# Patient Record
Sex: Female | Born: 1960 | Race: White | Hispanic: No | Marital: Married | Smoking: Former smoker
Health system: Southern US, Community
[De-identification: ages and names within clinical notes are randomized; demographics above are authoritative.]

## PROBLEM LIST (undated history)

## (undated) DIAGNOSIS — F32A Depression, unspecified: Secondary | ICD-10-CM

## (undated) DIAGNOSIS — K8020 Calculus of gallbladder without cholecystitis without obstruction: Secondary | ICD-10-CM

## (undated) DIAGNOSIS — G43909 Migraine, unspecified, not intractable, without status migrainosus: Secondary | ICD-10-CM

## (undated) DIAGNOSIS — N189 Chronic kidney disease, unspecified: Secondary | ICD-10-CM

## (undated) DIAGNOSIS — C50919 Malignant neoplasm of unspecified site of unspecified female breast: Secondary | ICD-10-CM

## (undated) DIAGNOSIS — F329 Major depressive disorder, single episode, unspecified: Secondary | ICD-10-CM

## (undated) HISTORY — PX: HYSTEROSCOPY WITH D & C: SHX1775

## (undated) HISTORY — PX: MASTECTOMY: SHX3

## (undated) HISTORY — PX: BREAST SURGERY: SHX581

---

## 2009-03-15 ENCOUNTER — Ambulatory Visit: Payer: Self-pay | Admitting: Oncology

## 2009-04-27 ENCOUNTER — Encounter (INDEPENDENT_AMBULATORY_CARE_PROVIDER_SITE_OTHER): Payer: Self-pay | Admitting: *Deleted

## 2009-04-27 ENCOUNTER — Ambulatory Visit: Payer: Self-pay | Admitting: Family Medicine

## 2009-04-27 DIAGNOSIS — E7800 Pure hypercholesterolemia, unspecified: Secondary | ICD-10-CM

## 2009-04-27 DIAGNOSIS — E1129 Type 2 diabetes mellitus with other diabetic kidney complication: Secondary | ICD-10-CM

## 2009-04-27 DIAGNOSIS — C50919 Malignant neoplasm of unspecified site of unspecified female breast: Secondary | ICD-10-CM | POA: Insufficient documentation

## 2009-04-27 DIAGNOSIS — F329 Major depressive disorder, single episode, unspecified: Secondary | ICD-10-CM

## 2009-04-27 DIAGNOSIS — E1140 Type 2 diabetes mellitus with diabetic neuropathy, unspecified: Secondary | ICD-10-CM | POA: Insufficient documentation

## 2009-04-27 DIAGNOSIS — Z853 Personal history of malignant neoplasm of breast: Secondary | ICD-10-CM | POA: Insufficient documentation

## 2009-05-04 ENCOUNTER — Telehealth (INDEPENDENT_AMBULATORY_CARE_PROVIDER_SITE_OTHER): Payer: Self-pay | Admitting: *Deleted

## 2009-05-31 ENCOUNTER — Ambulatory Visit: Payer: Self-pay | Admitting: Family Medicine

## 2009-05-31 LAB — CONVERTED CEMR LAB
ALT: 15 units/L (ref 0–35)
AST: 13 units/L (ref 0–37)
Alkaline Phosphatase: 79 units/L (ref 39–117)
BUN: 9 mg/dL (ref 6–23)
Calcium: 9.3 mg/dL (ref 8.4–10.5)
Creatinine, Ser: 0.65 mg/dL (ref 0.40–1.20)
HDL: 41 mg/dL (ref >39)
Hemoglobin: 15.3 g/dL — ABNORMAL HIGH (ref 12.0–15.0)
Hgb A1c MFr Bld: 6.6 %
LDL Cholesterol: 92 mg/dL (ref 0–99)
Platelets: 230 10*3/uL (ref 150–400)
RBC: 5.03 M/uL (ref 3.87–5.11)
RDW: 14.2 % (ref 11.5–15.5)
TSH: 1.444 microintl units/mL (ref 0.350–4.500)
Total CHOL/HDL Ratio: 4.2
Total Protein: 6.6 g/dL (ref 6.0–8.3)
Triglycerides: 191 mg/dL — ABNORMAL HIGH (ref <150)
VLDL: 38 mg/dL (ref 0–40)

## 2009-06-02 ENCOUNTER — Ambulatory Visit (HOSPITAL_COMMUNITY): Admission: RE | Admit: 2009-06-02 | Discharge: 2009-06-02 | Payer: Self-pay | Admitting: Family Medicine

## 2009-06-27 ENCOUNTER — Telehealth: Payer: Self-pay | Admitting: Family Medicine

## 2009-06-27 ENCOUNTER — Ambulatory Visit: Payer: Self-pay | Admitting: Family Medicine

## 2009-06-27 DIAGNOSIS — R300 Dysuria: Secondary | ICD-10-CM

## 2009-06-27 LAB — CONVERTED CEMR LAB
Blood in Urine, dipstick: NEGATIVE
Nitrite: NEGATIVE
WBC Urine, dipstick: NEGATIVE

## 2009-07-08 ENCOUNTER — Encounter: Payer: Self-pay | Admitting: Family Medicine

## 2009-07-08 ENCOUNTER — Ambulatory Visit: Payer: Self-pay | Admitting: Family Medicine

## 2009-07-08 LAB — CONVERTED CEMR LAB: Microalbumin U total vol: NEGATIVE mg/L

## 2009-08-26 ENCOUNTER — Telehealth (INDEPENDENT_AMBULATORY_CARE_PROVIDER_SITE_OTHER): Payer: Self-pay | Admitting: *Deleted

## 2009-09-10 ENCOUNTER — Encounter (INDEPENDENT_AMBULATORY_CARE_PROVIDER_SITE_OTHER): Payer: Self-pay | Admitting: *Deleted

## 2009-09-10 DIAGNOSIS — Z87891 Personal history of nicotine dependence: Secondary | ICD-10-CM | POA: Insufficient documentation

## 2009-09-13 ENCOUNTER — Encounter: Payer: Self-pay | Admitting: Family Medicine

## 2009-09-13 ENCOUNTER — Ambulatory Visit: Payer: Self-pay | Admitting: Oncology

## 2009-09-13 LAB — COMPREHENSIVE METABOLIC PANEL
AST: 16 U/L (ref 0–37)
Alkaline Phosphatase: 68 U/L (ref 39–117)
Glucose, Bld: 129 mg/dL — ABNORMAL HIGH (ref 70–99)
Potassium: 3.8 mEq/L (ref 3.5–5.3)
Sodium: 141 mEq/L (ref 135–145)
Total Bilirubin: 0.4 mg/dL (ref 0.3–1.2)
Total Protein: 6 g/dL (ref 6.0–8.3)

## 2009-09-13 LAB — CBC WITH DIFFERENTIAL/PLATELET
Basophils Absolute: 0 10*3/uL (ref 0.0–0.1)
EOS%: 3.2 % (ref 0.0–7.0)
Eosinophils Absolute: 0.3 10*3/uL (ref 0.0–0.5)
HGB: 14.3 g/dL (ref 11.6–15.9)
LYMPH%: 37.9 % (ref 14.0–49.7)
MCH: 31.3 pg (ref 25.1–34.0)
MCV: 90.3 fL (ref 79.5–101.0)
MONO%: 5.4 % (ref 0.0–14.0)
NEUT#: 4.5 10*3/uL (ref 1.5–6.5)
Platelets: 208 10*3/uL (ref 145–400)

## 2009-10-06 ENCOUNTER — Ambulatory Visit (HOSPITAL_COMMUNITY): Admission: RE | Admit: 2009-10-06 | Discharge: 2009-10-06 | Payer: Self-pay | Admitting: Oncology

## 2009-12-07 ENCOUNTER — Telehealth: Payer: Self-pay | Admitting: Family Medicine

## 2009-12-08 ENCOUNTER — Ambulatory Visit: Payer: Self-pay | Admitting: Family Medicine

## 2009-12-08 ENCOUNTER — Encounter: Payer: Self-pay | Admitting: Family Medicine

## 2009-12-08 DIAGNOSIS — J0100 Acute maxillary sinusitis, unspecified: Secondary | ICD-10-CM

## 2009-12-14 ENCOUNTER — Ambulatory Visit: Payer: Self-pay | Admitting: Oncology

## 2009-12-26 LAB — COMPREHENSIVE METABOLIC PANEL
ALT: 9 U/L (ref 0–35)
Albumin: 4.7 g/dL (ref 3.5–5.2)
Alkaline Phosphatase: 75 U/L (ref 39–117)
BUN: 17 mg/dL (ref 6–23)
CO2: 26 mEq/L (ref 19–32)
Calcium: 9.9 mg/dL (ref 8.4–10.5)
Creatinine, Ser: 0.67 mg/dL (ref 0.40–1.20)
Total Protein: 6.7 g/dL (ref 6.0–8.3)

## 2009-12-26 LAB — CBC WITH DIFFERENTIAL/PLATELET
Basophils Absolute: 0 10*3/uL (ref 0.0–0.1)
EOS%: 2.6 % (ref 0.0–7.0)
HCT: 43.9 % (ref 34.8–46.6)
HGB: 15.2 g/dL (ref 11.6–15.9)
MCH: 31.8 pg (ref 25.1–34.0)
MCHC: 34.6 g/dL (ref 31.5–36.0)
MCV: 92 fL (ref 79.5–101.0)
Platelets: 190 10*3/uL (ref 145–400)
RDW: 13.3 % (ref 11.2–14.5)
WBC: 9.4 10*3/uL (ref 3.9–10.3)
lymph#: 2.9 10*3/uL (ref 0.9–3.3)

## 2010-02-16 ENCOUNTER — Telehealth: Payer: Self-pay | Admitting: Family Medicine

## 2010-02-24 ENCOUNTER — Encounter: Payer: Self-pay | Admitting: Family Medicine

## 2010-03-24 ENCOUNTER — Ambulatory Visit: Payer: Self-pay | Admitting: Oncology

## 2010-03-27 ENCOUNTER — Encounter: Payer: Self-pay | Admitting: Family Medicine

## 2010-03-27 LAB — COMPREHENSIVE METABOLIC PANEL
Albumin: 4.5 g/dL (ref 3.5–5.2)
Sodium: 141 mEq/L (ref 135–145)

## 2010-03-27 LAB — CBC WITH DIFFERENTIAL/PLATELET
Basophils Absolute: 0 10*3/uL (ref 0.0–0.1)
EOS%: 3.7 % (ref 0.0–7.0)
Eosinophils Absolute: 0.3 10*3/uL (ref 0.0–0.5)
HCT: 42.8 % (ref 34.8–46.6)
HGB: 14.6 g/dL (ref 11.6–15.9)
LYMPH%: 33.6 % (ref 14.0–49.7)
MCH: 31.5 pg (ref 25.1–34.0)
MCHC: 34.1 g/dL (ref 31.5–36.0)
MONO#: 0.4 10*3/uL (ref 0.1–0.9)
MONO%: 5.4 % (ref 0.0–14.0)
NEUT#: 4.3 10*3/uL (ref 1.5–6.5)
Platelets: 199 10*3/uL (ref 145–400)
RBC: 4.65 10*6/uL (ref 3.70–5.45)
RDW: 13.4 % (ref 11.2–14.5)
WBC: 7.5 10*3/uL (ref 3.9–10.3)
lymph#: 2.5 10*3/uL (ref 0.9–3.3)

## 2010-03-31 ENCOUNTER — Ambulatory Visit: Payer: Self-pay | Admitting: Family Medicine

## 2010-03-31 DIAGNOSIS — R5383 Other fatigue: Secondary | ICD-10-CM

## 2010-03-31 DIAGNOSIS — R109 Unspecified abdominal pain: Secondary | ICD-10-CM | POA: Insufficient documentation

## 2010-03-31 DIAGNOSIS — R5381 Other malaise: Secondary | ICD-10-CM | POA: Insufficient documentation

## 2010-03-31 DIAGNOSIS — G5600 Carpal tunnel syndrome, unspecified upper limb: Secondary | ICD-10-CM

## 2010-03-31 LAB — CONVERTED CEMR LAB: Albumin/Creatinine Ratio, Urine, POC: <30

## 2010-04-03 LAB — CONVERTED CEMR LAB
AST: 11 units/L (ref 0–37)
Albumin: 4.5 g/dL (ref 3.5–5.2)
BUN: 14 mg/dL (ref 6–23)
CO2: 24 meq/L (ref 19–32)
Chloride: 107 meq/L (ref 96–112)
Potassium: 4.1 meq/L (ref 3.5–5.3)
Sodium: 142 meq/L (ref 135–145)
TSH: 1.188 microintl units/mL (ref 0.350–4.500)
Total Protein: 6.7 g/dL (ref 6.0–8.3)

## 2010-04-12 ENCOUNTER — Telehealth: Payer: Self-pay | Admitting: *Deleted

## 2010-05-05 ENCOUNTER — Ambulatory Visit (HOSPITAL_COMMUNITY)
Admission: RE | Admit: 2010-05-05 | Discharge: 2010-05-05 | Payer: Self-pay | Source: Home / Self Care | Admitting: Family Medicine

## 2010-05-05 DIAGNOSIS — K8020 Calculus of gallbladder without cholecystitis without obstruction: Secondary | ICD-10-CM

## 2010-05-15 ENCOUNTER — Telehealth: Payer: Self-pay | Admitting: Family Medicine

## 2010-05-18 ENCOUNTER — Telehealth: Payer: Self-pay | Admitting: Family Medicine

## 2010-05-19 ENCOUNTER — Ambulatory Visit: Payer: Self-pay | Admitting: Family Medicine

## 2010-05-19 DIAGNOSIS — M542 Cervicalgia: Secondary | ICD-10-CM

## 2010-06-09 ENCOUNTER — Telehealth: Payer: Self-pay | Admitting: Family Medicine

## 2010-06-22 ENCOUNTER — Ambulatory Visit: Payer: Self-pay | Admitting: Oncology

## 2010-07-21 ENCOUNTER — Telehealth: Payer: Self-pay | Admitting: Family Medicine

## 2010-07-25 ENCOUNTER — Encounter: Payer: Self-pay | Admitting: Family Medicine

## 2010-07-28 ENCOUNTER — Ambulatory Visit: Payer: Self-pay | Admitting: Oncology

## 2010-08-01 ENCOUNTER — Encounter: Payer: Self-pay | Admitting: Family Medicine

## 2010-08-01 LAB — COMPREHENSIVE METABOLIC PANEL
CO2: 29 mEq/L (ref 19–32)
Creatinine, Ser: 0.66 mg/dL (ref 0.40–1.20)
Total Protein: 6.5 g/dL (ref 6.0–8.3)

## 2010-08-01 LAB — CBC WITH DIFFERENTIAL/PLATELET
HCT: 43.7 % (ref 34.8–46.6)
HGB: 14.8 g/dL (ref 11.6–15.9)
MCH: 30.9 pg (ref 25.1–34.0)
MCHC: 33.8 g/dL (ref 31.5–36.0)
MCV: 91.6 fL (ref 79.5–101.0)
MONO#: 0.4 10*3/uL (ref 0.1–0.9)
NEUT#: 4.3 10*3/uL (ref 1.5–6.5)
NEUT%: 49.6 % (ref 38.4–76.8)
RBC: 4.77 10*6/uL (ref 3.70–5.45)
RDW: 13.4 % (ref 11.2–14.5)

## 2010-12-13 ENCOUNTER — Ambulatory Visit: Payer: Self-pay | Admitting: Oncology

## 2010-12-13 ENCOUNTER — Emergency Department (HOSPITAL_COMMUNITY)
Admission: EM | Admit: 2010-12-13 | Discharge: 2010-12-13 | Payer: Self-pay | Source: Home / Self Care | Admitting: Family Medicine

## 2011-01-02 NOTE — Assessment & Plan Note (Signed)
Summary: f/up,tcb   Vital Signs:  Patient profile:   50 year old female Height:      66.75 inches Weight:      157.4 pounds BMI:     24.93 Temp:     98.3 degrees F oral Pulse rate:   88 / minute BP sitting:   127 / 75  (left arm) Cuff size:   regular  Vitals Entered By: Gladstone Pih (March 31, 2010 1:28 PM) CC: F/U DM Is Patient Diabetic? Yes Did you bring your meter with you today? No Pain Assessment Patient in pain? no        Primary Care Provider:  Doralee Albino MD  CC:  F/U DM.  History of Present Illness: Rec DM Intermitant hand and feet numbness.  Rt. side seems worse - especially Rt arm Stomach problems since chemo Sharp midline pains.  Occaisional vomiting.  Never previously diagnosed with any abd problems but was found to have one gallstone on some Xray 2008.   Note good wt and BP Off all chol meds due to muscle pain with zocor Also C/O easiy fatiguability  Habits & Providers  Alcohol-Tobacco-Diet     Alcohol drinks/day: 0     Tobacco Status: current     Tobacco Counseling: to quit use of tobacco products     Cigarette Packs/Day: <0.25  Exercise-Depression-Behavior     Does Patient Exercise: yes     Exercise Counseling: not indicated; exercise is adequate     Type of exercise: very active with daily chores     Times/week: 7     Have you felt down or hopeless? no     Have you felt little pleasure in things? no     Depression Counseling: not indicated; screening negative for depression     Drug Use: never     Seat Belt Use: always  Current Medications (verified): 1)  Celexa 40 Mg Tabs (Citalopram Hydrobromide) .... Take 1 Tablet By Mouth Once Daily 2)  Metformin Hcl 1000 Mg Tabs (Metformin Hcl) .... Take 1 Tablet By Mouth Two Times A Day 3)  Multivitamins  Tabs (Multiple Vitamin) .... Take 2 Tablets By Mouth Once Daily 4)  Aspirin 81 Mg Tbec (Aspirin) .... Take 1 Tablet By Mouth Once Daily  Allergies (verified): 1)  ! Macrobid 2)   Simvastatin  Past History:  Past medical, surgical, family and social histories (including risk factors) reviewed, and no changes noted (except as noted below).  Past Medical History: Reviewed history from 04/27/2009 and no changes required. Breast cancer, hx of- had a double mastectomy Depression  Past Surgical History: Reviewed history from 04/27/2009 and no changes required. Double Mastectomy  Family History: Reviewed history from 04/27/2009 and no changes required. + DM cancer HBP  Social History: Reviewed history from 12/08/2009 and no changes required. Lives with her mother Kendal Hymen, is currently unemployeed.  Enjoys reading, watching television, yardwork, has a dog.  Uses no alcohol or illicit drugs, smokes cigarettes.  Walks 4 days per week for exercise. Has boyfriend. Drug Use:  never Seat Belt Use:  always  Physical Exam  General:  Well-developed,well-nourished,in no acute distress; alert,appropriate and cooperative throughout examination Lungs:  Normal respiratory effort, chest expands symmetrically. Lungs are clear to auscultation, no crackles or wheezes. Heart:  Normal rate and regular rhythm. S1 and S2 normal without gallop, murmur, click, rub or other extra sounds. Abdomen:  Bowel sounds positive,abdomen soft and non-tender without masses, organomegaly or hernias noted.   Impression & Recommendations:  Problem # 1:  HYPERCHOLESTEROLEMIA (ICD-272.0)  She is going to do one more trial of simvastatin to see if muscle pain recurs.  Seh will call me is 2-3 weeks to let me know if pain recurrs The following medications were removed from the medication list:    Zocor 80 Mg Tabs (Simvastatin) .Marland Kitchen... Take 1 tablet by mouth once daily  Orders: [facility]- Est  Level 4 ([address])  Problem # 2:  DIABETES MELLITUS, TYPE II (ICD-250.00)  Her updated medication list for this problem includes:    Metformin Hcl 1000 Mg Tabs (Metformin hcl) .Marland Kitchen... Take 1 tablet by mouth two times a day     Aspirin 81 Mg Tbec (Aspirin) .Marland Kitchen... Take 1 tablet by mouth once daily  Orders: A1C-[facility] (60454) UA Microalbumin-[facility] (09811) [facility]- Est  Level 4 (91478)  Problem # 3:  ABDOMINAL PAIN (ICD-789.00) Will check labs and ultrasound given history of gall stone. Orders: Ultrasound (Ultrasound) [facility]- Est  Level 4 (29562)  Problem # 4:  FATIGUE (ICD-780.79) Check labs. Orders: Comp Met-[facility] [id number]) TSH-[facility] [phone]) [facility]- Est  Level 4 (24401)  Complete Medication List: 1)  Celexa 40 Mg Tabs (Citalopram hydrobromide) .... Take 1 tablet by mouth once daily 2)  Metformin Hcl 1000 Mg Tabs (Metformin hcl) .... Take 1 tablet by mouth two times a day 3)  Multivitamins Tabs (Multiple vitamin) .... Take 2 tablets by mouth once daily 4)  Aspirin 81 Mg Tbec (Aspirin) .... Take 1 tablet by mouth once daily 5)  Famotidine 40 Mg Tabs (Famotidine) .... One by mouth daily Prescriptions: FAMOTIDINE 40 MG TABS (FAMOTIDINE) one by mouth daily  #30 x 12   Entered and Authorized by:   Doralee Albino MD   Signed by:   Doralee Albino MD on 03/31/2010   Method used:   Electronically to        [facility]* (retail)       [address]       [place], [place]  [address]       Ph: [phone]       Fax: [phone]   RxID:   [id number] METFORMIN HCL 1000 MG TABS (METFORMIN HCL) take 1 tablet by mouth two times a day  #180 x 3   Entered and Authorized by:   Doralee Albino MD   Signed by:   Doralee Albino MD on 03/31/2010   Method used:   Electronically to        [facility]* (retail)       [address]       [place], [place]  [address]       Ph: [phone]       Fax: [phone]   RxID:   [id number] CELEXA 40 MG TABS (CITALOPRAM HYDROBROMIDE) Take 1 tablet by mouth once daily  #90 x 3   Entered and Authorized by:   Doralee Albino MD   Signed by:   Doralee Albino MD on 03/31/2010   Method used:   Electronically to        [facility]* (retail)       [address]       [place], [place]  [address]       Ph: [phone]       Fax: [phone]   RxID:   [id number]    Laboratory Results   Urine Tests  Date/Time Received: March 31, 2010 2:19 PM  Date/Time Reported: March 31, 2010 2:38 PM   Microalbumin (urine): 10 mg/L  Creatinine: 10mg /dL  A:C Ratio <04 Normal Comments: ...............test performed by......Marland KitchenBonnie A. Swaz[place], MLS (ASCP)cm   Blood Tests   Date/Time Received: March 31, 2010 1:34 PM  Date/Time Reported: March 31, 2010 1:43 PM   HGBA1C: 6.2%   (Normal Range: Non-Diabetic - 3-6%   Control Diabetic - 6-8%)  Comments: ...........test performed by...........Marland KitchenTerese Door, CMA      Prevention & Chronic Care Immunizations   Influenza vaccine: given  (01/03/2009)   Influenza vaccine due: 01/03/2010    Tetanus booster: 04/27/2009: given   Tetanus booster due: 04/28/2019    Pneumococcal vaccine: Pneumovax  (04/27/2009)   Pneumococcal vaccine due: None  Other Screening   Pap smear: NEGATIVE FOR INTRAEPITHELIAL LESIONS OR MALIGNANCY.  (07/08/2009)   Pap smear action/deferral: Ordered  (07/08/2009)   Pap smear due: 07/08/2012    Mammogram: Not documented   Mammogram due: Not Indicated   Smoking status: current  (03/31/2010)  Diabetes Mellitus   HgbA1C: 6.2  (03/31/2010)    Eye exam: Not documented    Foot exam: Not documented   High risk foot: Not documented   Foot care education: Not documented    Urine microalbumin/creatinine ratio: Not documented   Urine microalbumin action/deferral: Ordered    Diabetes flowsheet reviewed?: Yes   Progress toward A1C goal: At goal  Lipids   Total Cholesterol: 171  (05/31/2009)   LDL: 92  (05/31/2009)   LDL Direct: Not documented   HDL: 41  (05/31/2009)   Triglycerides: 191  (05/31/2009)    SGOT (AST): 13  (05/31/2009)   SGPT (ALT): 15  (05/31/2009) CMP ordered    Alkaline phosphatase: 79  (05/31/2009)   Total  bilirubin: 0.3  (05/31/2009)    Lipid flowsheet reviewed?: Yes   Progress toward LDL goal: At goal  Self-Management Support :   Personal Goals (by the next clinic visit) :     Personal A1C goal: 7  (03/31/2010)     Personal blood pressure goal: 130/80  (03/31/2010)     Personal LDL goal: 100  (03/31/2010)    Diabetes self-management support: Written self-care plan  (03/31/2010)   Diabetes care plan printed    Lipid self-management support: Written self-care plan  (03/31/2010)   Lipid self-care plan printed.

## 2011-01-02 NOTE — Miscellaneous (Signed)
Summary: Re: ophthalmology referral  Clinical Lists Changes   received notification from [facility] that they are unable to complete the  ophthalmology referral at this time due to lack of physicians in this speciality group. they will notify patient when they can process the referral. Theresia Lo RN  February 24, 2010 1:56 PM  Noted.  Unfortunate.  Doralee Albino MD  February 24, 2010 2:25 PM

## 2011-01-02 NOTE — Progress Notes (Signed)
Summary: Rx Req  Phone Note Call from Patient Call back at [phone]   Caller: Patient Summary of Call: Needs an rx for phenergren called into [facility]. Initial call taken by: Clydell Hakim,  July 21, 2010 2:57 PM  Follow-up for Phone Call        spoke with mother and patient will be in town Monday and wanted to pick this medication up. advised Dr. Leveda Anna will be back Monday and will ask him to do this first thing Monday . she will be here for a few hours on Monday and will call her when ready to pick up at pharmacy.. Follow-up by: Theresia Lo RN,  July 21, 2010 3:25 PM  Additional Follow-up for Phone Call Additional follow up Details #1::        Rx sent electronically Additional Follow-up by: Doralee Albino MD,  July 24, 2010 9:20 AM    Prescriptions: PROMETHAZINE HCL 25 MG TABS (PROMETHAZINE HCL) one by mouth q6h as needed nausea  #30 x 1   Entered and Authorized by:   Doralee Albino MD   Signed by:   Doralee Albino MD on 07/24/2010   Method used:   Electronically to        [facility]* (retail)       [address]       [place], [place]  [address]       Ph: [phone]       Fax: [phone]   RxID:   [id number]   mother notified that rx has been sent in. Theresia Lo RN  July 24, 2010 10:04 AM

## 2011-01-02 NOTE — Progress Notes (Signed)
Summary: phn msg  Phone Note Call from Patient   Caller: Makayla Owens Summary of Call: states that the surgeons office is making her pay 30% down before they do the surgery.  not sure what to do. Initial call taken by: De Nurse,  June 09, 2010 8:53 AM  Follow-up for Phone Call        will forward to Dr. Leveda Anna for any suggestions. Follow-up by: Theresia Lo RN,  June 09, 2010 9:42 AM  Additional Follow-up for Phone Call Additional follow up Details #1::        called and discussed options Additional Follow-up by: Doralee Albino MD,  June 09, 2010 12:36 PM

## 2011-01-02 NOTE — Progress Notes (Signed)
Summary: phn msg  Phone Note Call from Patient Call back at Home Phone [phone] Call back at [phone]   Caller: mom-Bonnie Summary of Call: called to discuss phn msg Initial call taken by: De Nurse,  May 15, 2010 9:27 AM  Follow-up for Phone Call        Ordered surgical referral.  See letter.  lisa gave referral to Lyn Follow-up by: Loralee Pacas CMA,  May 15, 2010 11:47 AM  Additional Follow-up for Phone Call Additional follow up Details #1::        Dr Leveda Anna spoke with pt and mom Additional Follow-up by: Gladstone Pih,  May 15, 2010 12:26 PM

## 2011-01-02 NOTE — Consult Note (Signed)
Summary: [facility]  [facility]   Imported By: Clydell Hakim 05/09/2010 12:24:10  _____________________________________________________________________  External Attachment:    Type:   Image     Comment:   External Document

## 2011-01-02 NOTE — Assessment & Plan Note (Signed)
Summary: sinus infection per pt.   Vital Signs:  Patient profile:   50 year old female Height:      66.75 inches Weight:      160 pounds BMI:     25.34 Temp:     98.2 degrees F oral Pulse rate:   81 / minute BP sitting:   121 / 81  (right arm) Cuff size:   regular  Vitals Entered By: Tessie Fass CMA (December 08, 2009 8:36 AM) CC: sinus infection Is Patient Diabetic? No Pain Assessment Patient in pain? yes     Location: head Intensity: 5   Primary Care Provider:  Doralee Albino MD  CC:  sinus infection.  History of Present Illness: Sinus infection: Pt has had congestion, sore throat, ear pain and headache for 5 days. She is also having maxillary sinus pain on the left and tooth pain. 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 She has not had any fever. Her mother is a sick contact who was just seen here a few days ago.   Habits & Providers  Alcohol-Tobacco-Diet     Tobacco Status: current     Cigarette Packs/Day: <0.25  Current Medications (verified): 1)  Zocor 80 Mg Tabs (Simvastatin) .... Take 1 Tablet By Mouth Once Daily 2)  Celexa 40 Mg Tabs (Citalopram Hydrobromide) .... Take 1 Tablet By Mouth Once Daily 3)  Metformin Hcl 1000 Mg Tabs (Metformin Hcl) .... Take 1 Tablet By Mouth Two Times A Day 4)  Multivitamins  Tabs (Multiple Vitamin) .... Take 2 Tablets By Mouth Once Daily 5)  Aspirin 81 Mg Tbec (Aspirin) .... Take 1 Tablet By Mouth Once Daily 6)  Amoxicillin 500 Mg Tabs (Amoxicillin) .... Take One Pill Every 12 Hours For 10 Days.  Allergies: 1)  ! Macrobid  Social History: Lives with her mother Kendal Hymen, is currently unemployeed.  Enjoys reading, watching television, yardwork, has a dog.  Uses no alcohol or illicit drugs, smokes cigarettes.  Walks 4 days per week for exercise. Has boyfriend. Packs/Day:  <0.25  Review of Systems        vitals reviewed and pertinent negatives and  positives seen in HPI   Physical Exam  General:  Well-developed,well-nourished,in no acute distress; alert,appropriate and cooperative throughout examination Head:  Normocephalic and atraumatic without obvious abnormalities. No apparent alopecia or balding. Tenderness over maxillary and frontal sinuses.  Ears:  External ear exam shows no significant lesions or deformities.  Otoscopic examination reveals clear canals, tympanic membranes are intact bilaterally without bulging, retraction, inflammation or discharge. Hearing is grossly normal bilaterally. Nose:  External nasal examination shows no deformity or inflammation. Nasal mucosa are pink and moist without lesions or exudates. Mouth:  Oral mucosa and oropharynx without lesions or exudates.  Teeth in good repair.   Impression & Recommendations:  Problem # 1:  ACUTE MAXILLARY SINUSITIS (ICD-461.0) Assessment New PT has had sinus and tooth pain (and ear pain) in the presence of a URI. Her mother has a sinus infection currently. Plan to treat her with Amox for 10 days.   Her updated medication list for this problem includes:    Amoxicillin 500 Mg Tabs (Amoxicillin) .Marland Kitchen... Take one pill every 12 hours for 10 days.  Orders: [facility]- Est Level  3 ([address])  Complete Medication List: 1)  Zocor 80 Mg Tabs (Simvastatin) .... Take 1 tablet by mouth once daily 2)  Celexa 40 Mg Tabs (  Citalopram hydrobromide) .... Take 1 tablet by mouth once daily 3)  Metformin Hcl 1000 Mg Tabs (Metformin hcl) .... Take 1 tablet by mouth two times a day 4)  Multivitamins Tabs (Multiple vitamin) .... Take 2 tablets by mouth once daily 5)  Aspirin 81 Mg Tbec (Aspirin) .... Take 1 tablet by mouth once daily 6)  Amoxicillin 500 Mg Tabs (Amoxicillin) .... Take one pill every 12 hours for 10 days.  Patient Instructions: 1)  It appears that you have a sinus infection.  2)  Take Amoxicillin for 10 days. 3)  Use good hand washing.  Prescriptions: AMOXICILLIN 500 MG TABS  (AMOXICILLIN) take one pill every 12 hours for 10 days.  #20 x 0   Entered and Authorized by:   Jamie Brookes MD   Signed by:   Jamie Brookes MD on 12/08/2009   Method used:   Electronically to        [facility]* (retail)       [address]       [place], [place]  [address]       Ph: [phone]       Fax: [phone]   RxID:   [id number]

## 2011-01-02 NOTE — Letter (Signed)
Summary: Out of Work  [facility]  [address]   [place], [place] [address]   Phone: [phone]  Fax: [phone]    December 08, 2009   Employee:  DILYNN MUNROE    To Whom It May Concern:   For Medical reasons, please excuse the above named employee from work for the following dates:  Start:   12-08-09  End:   12-12-09  If you need additional information, please feel free to contact our office.         Sincerely,    Jamie Brookes MD

## 2011-01-02 NOTE — Progress Notes (Signed)
Summary: phn msg  Phone Note Call from Patient Call back at Home Phone [phone]   Caller: Patient Summary of Call: pt scheduled for ultrasound of stomach on Friday and is unable to go at that time needs to change day. Initial call taken by: Clydell Hakim,  Apr 12, 2010 2:47 PM  Follow-up for Phone Call        spoke with pt's mother and she will give information to pt so that she can reschedule her appt for U/S Follow-up by: Loralee Pacas CMA,  Apr 12, 2010 5:41 PM  Additional Follow-up for Phone Call Additional follow up Details #1::        Recheduled ultrasound for 05/05/10 @ 8:30. Additional Follow-up by: Clydell Hakim,  Apr 13, 2010 9:53 AM

## 2011-01-02 NOTE — Progress Notes (Signed)
Summary: triage  Phone Note Call from Patient Call back at [phone]   Caller: mom-Bonnie Summary of Call: n/v x 2 days - has gall stones but is not sure what to do... Initial call taken by: De Nurse,  May 18, 2010 1:34 PM  Follow-up for Phone Call        mom did not have details.  I asked her dtr's number so I could talk with her. it is [phone] dtr states her gallbladder pain comes & goes & is very intense. has been battling diarrhea off & on x 2 weks. 3 stools so far today.  also has N&V. this also comes & goes. has not taken anything for this. am cbg today was 103. she tests two times a day.  she is unable to get here before we close. advised OTC Immodium and sip small amounts of fluid every 5-10 minutes as tolerated. she is taking some of her mom's antiemetic.  discussed diet. advised NO fats & gave specific examples of hidden fats & obvious fats. told her this will help her gallbladder. told her we are waiting for a volunteer surgeon to handle the operation. we will call her as soon as we hear back that one has volunteered.  convinced her to come in tomorrow. she will see Dr. Leveda Anna at 1:30 told her if pain gets worse, unable to keep anything down, diarrhea is worse-GO TO ED there. due to having diabetes she is at risk of serious consequences. to use her judgement of how she is feeling. do not wait until tomorrow if worse. sh agreed with this  to pcp. she uses [facility] [address] in [place] Follow-up by: Golden Circle RN,  May 18, 2010 1:45 PM  Additional Follow-up for Phone Call Additional follow up Details #1::        noted and agree Additional Follow-up by: Doralee Albino MD,  May 19, 2010 10:16 AM

## 2011-01-02 NOTE — Consult Note (Signed)
[facility]   Imported By: De Nurse 08/15/2010 12:17:32  _____________________________________________________________________  External Attachment:    Type:   Image     Comment:   External Document

## 2011-01-02 NOTE — Assessment & Plan Note (Signed)
Summary: n&v,diarrhea,gallbladder/[place]/Leylany Nored   Vital Signs:  Patient profile:   50 year old female Height:      66.5 inches Weight:      158 pounds BMI:     25.21 Temp:     98.2 degrees F Pulse rate:   80 / minute BP sitting:   130 / 84  Vitals Entered By: Dennison Nancy RN (May 19, 2010 1:41 PM) CC: N&V and neck pain Is Patient Diabetic? Yes Pain Assessment Patient in pain? yes     Location: neck Intensity: 8 Onset of pain  3 days ago   Primary Care Provider:  Doralee Albino MD  CC:  N&V and neck pain.  History of Present Illness: Neck pain, hurts with movement.  Has an Rx for robaxin available.  Nausea and intermitant vomiting.  Thought to be 2nd to her known cholelithiasis.  Awaiting surg referral - she is self pay and no appointments yet through project access.  Consider referral to [facility] if symptoms continue.  Has phenergan available  Habits & Providers  Alcohol-Tobacco-Diet     Tobacco Status: current     Tobacco Counseling: to quit use of tobacco products     Cigarette Packs/Day: <0.25  Current Medications (verified): 1)  Celexa 40 Mg Tabs (Citalopram Hydrobromide) .... Take 1 Tablet By Mouth Once Daily 2)  Metformin Hcl 1000 Mg Tabs (Metformin Hcl) .... Take 1 Tablet By Mouth Two Times A Day 3)  Multivitamins  Tabs (Multiple Vitamin) .... Take 2 Tablets By Mouth Once Daily 4)  Aspirin 81 Mg Tbec (Aspirin) .... Take 1 Tablet By Mouth Once Daily 5)  Famotidine 40 Mg Tabs (Famotidine) .... One By Mouth Daily 6)  Robaxin 500 Mg Tabs (Methocarbamol) .... One By Mouth Two Times A Day As Needed Muscle Spasm 7)  Simvastatin 40 Mg Tabs (Simvastatin) .... Take One Tablet At Bedtime 8)  Promethazine Hcl 25 Mg Tabs (Promethazine Hcl) .... One By Mouth Q6h As Needed Nausea  Allergies: 1)  ! Macrobid  Past History:  Past medical, surgical, family and social histories (including risk factors) reviewed, and no changes noted (except as noted below).  Past Medical  History: Reviewed history from 04/27/2009 and no changes required. Breast cancer, hx of- had a double mastectomy Depression  Past Surgical History: Reviewed history from 04/27/2009 and no changes required. Double Mastectomy  Family History: Reviewed history from 04/27/2009 and no changes required. + DM cancer HBP  Social History: Reviewed history from 12/08/2009 and no changes required. Lives with her mother Kendal Hymen, is currently unemployeed.  Enjoys reading, watching television, yardwork, has a dog.  Uses no alcohol or illicit drugs, smokes cigarettes.  Walks 4 days per week for exercise. Has boyfriend.   Physical Exam  General:  Well-developed,well-nourished,in no acute distress; alert,appropriate and cooperative throughout examination Mouth:  Throat nl.  No adenopathy Neck:  Spasm and pain with neck extensors Lungs:  Normal respiratory effort, chest expands symmetrically. Lungs are clear to auscultation, no crackles or wheezes. Heart:  Normal rate and regular rhythm. S1 and S2 normal without gallop, murmur, click, rub or other extra sounds. Abdomen:  Bowel sounds positive,abdomen soft and non-tender without masses, organomegaly or hernias noted.   Impression & Recommendations:  Problem # 1:  CHOLELITHIASIS (ICD-574.20)  Surg referral.  Phenergan when symptomatic.  Orders: [facility]- Est Level  3 ([address])  Problem # 2:  NECK PAIN (ICD-723.1)  musculoskeletal without major trauma.  Observe. Her updated medication list for this problem includes:  Aspirin 81 Mg Tbec (Aspirin) .Marland Kitchen... Take 1 tablet by mouth once daily    Robaxin 500 Mg Tabs (Methocarbamol) ..... One by mouth two times a day as needed muscle spasm  Orders: [facility]- Est Level  3 (99213)  Complete Medication List: 1)  Celexa 40 Mg Tabs (Citalopram hydrobromide) .... Take 1 tablet by mouth once daily 2)  Metformin Hcl 1000 Mg Tabs (Metformin hcl) .... Take 1 tablet by mouth two times a day 3)  Multivitamins Tabs  (Multiple vitamin) .... Take 2 tablets by mouth once daily 4)  Aspirin 81 Mg Tbec (Aspirin) .... Take 1 tablet by mouth once daily 5)  Famotidine 40 Mg Tabs (Famotidine) .... One by mouth daily 6)  Robaxin 500 Mg Tabs (Methocarbamol) .... One by mouth two times a day as needed muscle spasm 7)  Simvastatin 40 Mg Tabs (Simvastatin) .... Take one tablet at bedtime 8)  Promethazine Hcl 25 Mg Tabs (Promethazine hcl) .... One by mouth q6h as needed nausea Prescriptions: SIMVASTATIN 40 MG TABS (SIMVASTATIN) Take one tablet at bedtime  #90 x 3   Entered and Authorized by:   Doralee Albino MD   Signed by:   Doralee Albino MD on 05/22/2010   Method used:   Historical   RxID:   [id number]

## 2011-01-02 NOTE — Progress Notes (Signed)
Summary: Ref Req  Phone Note Call from Patient Call back at [phone]   Caller: Mom-Bonnie Summary of Call: Pt needs a referral to eye doctor. Initial call taken by: Clydell Hakim,  February 16, 2010 8:46 AM  Follow-up for Phone Call        referral faxed again to [facility] referral was sent in August 2010 but there was no orange card at that time. then card expired. now patient has updated card.  Bonnie notified. Follow-up by: Theresia Lo RN,  February 16, 2010 10:43 AM  Additional Follow-up for Phone Call Additional follow up Details #1::        noted and agree Additional Follow-up by: Doralee Albino MD,  February 16, 2010 12:17 PM

## 2011-01-02 NOTE — Miscellaneous (Signed)
Summary: phenergan refill  Clinical Lists Changes Pharmacy's computers down and did not receive Rx.  Will resend. Medications: Rx of PROMETHAZINE HCL 25 MG TABS (PROMETHAZINE HCL) one by mouth q6h as needed nausea;  #30 x 1;  Signed;  Entered by: Doralee Albino MD;  Authorized by: Doralee Albino MD;  Method used: Electronically to [facility]*, [address], [place], [place]  [address], Ph: [phone], Fax: [phone]    Prescriptions: PROMETHAZINE HCL 25 MG TABS (PROMETHAZINE HCL) one by mouth q6h as needed nausea  #30 x 1   Entered and Authorized by:   Doralee Albino MD   Signed by:   Doralee Albino MD on 07/25/2010   Method used:   Electronically to        [facility]* (retail)       [address]       [place], [place]  [address]       Ph: [phone]       Fax: [phone]   RxID:   [id number]

## 2011-01-02 NOTE — Progress Notes (Signed)
Summary: triage  Phone Note Call from Patient Call back at [phone]   Caller: Patient Summary of Call: has a stomach virus and feels bad and needs to come in before tomorrow at noon.  needs a note for work. Initial call taken by: De Nurse,  December 07, 2009 2:19 PM  Follow-up for Phone Call        c/o sinus infection, HA.   had nausea & stomach cramps. unable to come in today because she feels so sick. taking otc for head congestion but they are not really working. advised tylenol or ibu for the HA. to drink plenty of fluids. 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 Follow-up by: Golden Circle RN,  December 07, 2009 2:31 PM  Additional Follow-up for Phone Call Additional follow up Details #1::        noted and agree Additional Follow-up by: Doralee Albino MD,  December 08, 2009 12:53 PM

## 2011-03-04 ENCOUNTER — Emergency Department (HOSPITAL_COMMUNITY)
Admission: EM | Admit: 2011-03-04 | Discharge: 2011-03-04 | Disposition: A | Discharge status: Home / Self Care | Payer: Self-pay | Attending: Emergency Medicine | Admitting: Emergency Medicine

## 2011-03-04 DIAGNOSIS — R112 Nausea with vomiting, unspecified: Secondary | ICD-10-CM | POA: Insufficient documentation

## 2011-03-04 DIAGNOSIS — E119 Type 2 diabetes mellitus without complications: Secondary | ICD-10-CM | POA: Insufficient documentation

## 2011-03-04 DIAGNOSIS — J3489 Other specified disorders of nose and nasal sinuses: Secondary | ICD-10-CM | POA: Insufficient documentation

## 2011-03-04 DIAGNOSIS — K8020 Calculus of gallbladder without cholecystitis without obstruction: Secondary | ICD-10-CM | POA: Insufficient documentation

## 2011-03-04 DIAGNOSIS — E7800 Pure hypercholesterolemia, unspecified: Secondary | ICD-10-CM | POA: Insufficient documentation

## 2011-03-04 DIAGNOSIS — Z853 Personal history of malignant neoplasm of breast: Secondary | ICD-10-CM | POA: Insufficient documentation

## 2011-03-04 DIAGNOSIS — R197 Diarrhea, unspecified: Secondary | ICD-10-CM | POA: Insufficient documentation

## 2011-03-04 DIAGNOSIS — R109 Unspecified abdominal pain: Secondary | ICD-10-CM | POA: Insufficient documentation

## 2011-03-04 DIAGNOSIS — Z79899 Other long term (current) drug therapy: Secondary | ICD-10-CM | POA: Insufficient documentation

## 2011-03-04 LAB — URINALYSIS, ROUTINE W REFLEX MICROSCOPIC
Glucose, UA: NEGATIVE mg/dL
Hgb urine dipstick: NEGATIVE
Protein, ur: NEGATIVE mg/dL
Specific Gravity, Urine: 1.006 (ref 1.005–1.030)
Urobilinogen, UA: 0.2 mg/dL (ref 0.0–1.0)

## 2011-03-04 LAB — COMPREHENSIVE METABOLIC PANEL
ALT: 13 U/L (ref 0–35)
AST: 13 U/L (ref 0–37)
Alkaline Phosphatase: 93 U/L (ref 39–117)
Calcium: 9 mg/dL (ref 8.4–10.5)
Chloride: 99 mEq/L (ref 96–112)
Creatinine, Ser: 0.51 mg/dL (ref 0.4–1.2)
GFR calc Af Amer: >60 mL/min (ref >60)
GFR calc non Af Amer: >60 mL/min (ref >60)
Glucose, Bld: 96 mg/dL (ref 70–99)
Total Bilirubin: 0.3 mg/dL (ref 0.3–1.2)
Total Protein: 6.6 g/dL (ref 6.0–8.3)

## 2011-03-04 LAB — DIFFERENTIAL
Basophils Relative: 1 % (ref 0–1)
Eosinophils Absolute: 0.3 10*3/uL (ref 0.0–0.7)
Eosinophils Relative: 3 % (ref 0–5)
Lymphocytes Relative: 43 % (ref 12–46)
Lymphs Abs: 4 10*3/uL (ref 0.7–4.0)
Neutro Abs: 4.5 10*3/uL (ref 1.7–7.7)
Neutrophils Relative %: 48 % (ref 43–77)

## 2011-03-04 LAB — CBC
HCT: 42.8 % (ref 36.0–46.0)
Hemoglobin: 15 g/dL (ref 12.0–15.0)
RDW: 13 % (ref 11.5–15.5)
WBC: 9.3 10*3/uL (ref 4.0–10.5)

## 2011-03-04 LAB — URINE MICROSCOPIC-ADD ON

## 2011-03-04 LAB — LIPASE, BLOOD: Lipase: 39 U/L (ref 11–59)

## 2011-03-21 ENCOUNTER — Other Ambulatory Visit (HOSPITAL_COMMUNITY): Payer: Self-pay | Admitting: Oncology

## 2011-03-21 ENCOUNTER — Encounter (HOSPITAL_BASED_OUTPATIENT_CLINIC_OR_DEPARTMENT_OTHER): Payer: Self-pay | Admitting: Oncology

## 2011-03-21 DIAGNOSIS — C50419 Malignant neoplasm of upper-outer quadrant of unspecified female breast: Secondary | ICD-10-CM

## 2011-03-21 LAB — CBC WITH DIFFERENTIAL/PLATELET
EOS%: 2.8 % (ref 0.0–7.0)
Eosinophils Absolute: 0.2 10*3/uL (ref 0.0–0.5)
HGB: 14.3 g/dL (ref 11.6–15.9)
LYMPH%: 35.4 % (ref 14.0–49.7)
MCHC: 34.7 g/dL (ref 31.5–36.0)
MONO#: 0.3 10*3/uL (ref 0.1–0.9)
MONO%: 4 % (ref 0.0–14.0)
Platelets: 214 10*3/uL (ref 145–400)
RDW: 12.9 % (ref 11.2–14.5)
lymph#: 2.7 10*3/uL (ref 0.9–3.3)

## 2011-03-21 LAB — COMPREHENSIVE METABOLIC PANEL
AST: 10 U/L (ref 0–37)
Calcium: 9.4 mg/dL (ref 8.4–10.5)
Glucose, Bld: 227 mg/dL — ABNORMAL HIGH (ref 70–99)
Potassium: 3.9 mEq/L (ref 3.5–5.3)
Sodium: 139 mEq/L (ref 135–145)

## 2011-04-07 ENCOUNTER — Other Ambulatory Visit: Payer: Self-pay | Admitting: Family Medicine

## 2011-04-08 ENCOUNTER — Other Ambulatory Visit: Payer: Self-pay | Admitting: Family Medicine

## 2011-04-09 NOTE — Telephone Encounter (Signed)
Refill request

## 2011-05-31 ENCOUNTER — Emergency Department (HOSPITAL_COMMUNITY)
Admission: EM | Admit: 2011-05-31 | Discharge: 2011-06-01 | Disposition: A | Discharge status: Home / Self Care | Payer: Self-pay | Attending: Emergency Medicine | Admitting: Emergency Medicine

## 2011-05-31 DIAGNOSIS — E7800 Pure hypercholesterolemia, unspecified: Secondary | ICD-10-CM | POA: Insufficient documentation

## 2011-05-31 DIAGNOSIS — R110 Nausea: Secondary | ICD-10-CM | POA: Insufficient documentation

## 2011-05-31 DIAGNOSIS — E119 Type 2 diabetes mellitus without complications: Secondary | ICD-10-CM | POA: Insufficient documentation

## 2011-05-31 DIAGNOSIS — G43909 Migraine, unspecified, not intractable, without status migrainosus: Secondary | ICD-10-CM | POA: Insufficient documentation

## 2011-07-07 ENCOUNTER — Other Ambulatory Visit: Payer: Self-pay | Admitting: Family Medicine

## 2011-07-09 NOTE — Telephone Encounter (Signed)
Refill request

## 2011-08-04 ENCOUNTER — Other Ambulatory Visit: Payer: Self-pay | Admitting: Family Medicine

## 2011-08-05 NOTE — Telephone Encounter (Signed)
Refill request

## 2011-09-09 ENCOUNTER — Encounter: Payer: Self-pay | Admitting: Emergency Medicine

## 2011-09-09 ENCOUNTER — Emergency Department (HOSPITAL_COMMUNITY)
Admission: EM | Admit: 2011-09-09 | Discharge: 2011-09-09 | Disposition: A | Discharge status: Home / Self Care | Payer: Self-pay | Attending: Emergency Medicine | Admitting: Emergency Medicine

## 2011-09-09 DIAGNOSIS — M6740 Ganglion, unspecified site: Secondary | ICD-10-CM | POA: Insufficient documentation

## 2011-09-09 DIAGNOSIS — M67439 Ganglion, unspecified wrist: Secondary | ICD-10-CM

## 2011-09-09 DIAGNOSIS — R609 Edema, unspecified: Secondary | ICD-10-CM | POA: Insufficient documentation

## 2011-09-09 DIAGNOSIS — Z79899 Other long term (current) drug therapy: Secondary | ICD-10-CM | POA: Insufficient documentation

## 2011-09-09 DIAGNOSIS — F17200 Nicotine dependence, unspecified, uncomplicated: Secondary | ICD-10-CM | POA: Insufficient documentation

## 2011-09-09 HISTORY — DX: Calculus of gallbladder without cholecystitis without obstruction: K80.20

## 2011-09-09 HISTORY — DX: Migraine, unspecified, not intractable, without status migrainosus: G43.909

## 2011-09-09 HISTORY — DX: Malignant neoplasm of unspecified site of unspecified female breast: C50.919

## 2011-09-09 HISTORY — DX: Depression, unspecified: F32.A

## 2011-09-09 HISTORY — DX: Major depressive disorder, single episode, unspecified: F32.9

## 2011-09-09 MED ORDER — NAPROXEN 500 MG PO TABS: 500.0000 mg | ORAL_TABLET | Freq: Two times a day (BID) | ORAL | Status: DC

## 2011-09-09 MED ORDER — PREDNISONE 50 MG PO TABS: 50.0000 mg | ORAL_TABLET | Freq: Every day | ORAL | Status: AC

## 2011-09-09 NOTE — ED Notes (Signed)
Patient reports knots to right wrist. Patient c/o swelling and burning to right hand and burning that goes up the right arm. 3 knots noted to wrist. Patient with history of breast cancer, bilateral mastectomy, and 6 months chemotherapy.  Patient alert/oriented at this time. Denies any needs. Awaiting MD evaluation.

## 2011-09-09 NOTE — ED Notes (Signed)
Patient with no complaints at this time. Respirations even and unlabored. Skin warm/dry. Discharge instructions reviewed with patient at this time. Patient given opportunity to voice concerns/ask questions. Patient discharged at this time and left Emergency Department with steady gait.   

## 2011-09-09 NOTE — ED Notes (Signed)
Patient c/o right hand/wrist swelling. Per patient "I noticed a 2 knots that came up on my wrist Thursday and then one last night. This morning my hand started swelling." Patient is a breast cancer survivor, has had a bilateral mastectomy.

## 2011-09-09 NOTE — ED Provider Notes (Signed)
Scribed for Donnetta Hutching, MD, the patient was seen in room APA08/APA08. This chart was scribed by [employer]. The patient's care started at 08:30  CSN: [id number] Arrival date & time: 09/09/2011  8:29 AM  Chief Complaint  Patient presents with  . Edema   HPI Makayla Owens is a 50 y.o. female with a history of diabetes mellitus who presents to the Emergency Department complaining of Knots in her right wrist, onset a.m 09/09/2011. Patient describes pain as a burning sensation. States it's like "pouring alcohol on a dull spot". 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 Patient is a breast cancer survivor and has had a bilateral mastectomy. Patient is in no apparent distress and there are no other associated, exacerbating or alleviating symptoms.   Past Medical History  Diagnosis Date  . Diabetes mellitus   . Breast cancer   . Depression   . Migraines   . Gall bladder stones     Past Surgical History  Procedure Date  . Mastectomy     Bilateral    Family History  Problem Relation Age of Onset  . Cancer Mother   . Heart failure Mother   . Diabetes Father   . Heart failure Father   . Cancer Father   . Stroke Father     History  Substance Use Topics  . Smoking status: Current Everyday Smoker -- 0.3 packs/day for 17 years    Types: Cigarettes  . Smokeless tobacco: Never Used  . Alcohol Use: No    OB History    Grav Para Term Preterm Abortions TAB SAB Ect Mult Living   2 1 1  1  1   1       Review of Systems  Constitutional: Negative for unexpected weight change.       10 Systems reviewed and are negative for acute change except as noted in the HPI.  All other systems reviewed and are negative.    Allergies  Nitrofurantoin  Home Medications   Current Outpatient Rx  Name Route Sig Dispense Refill  . CITALOPRAM HYDROBROMIDE 40 MG PO TABS  TAKE 1 TABLET BY MOUTH ONCE DAILY 90 tablet 2    CYCLE FILL MEDICATION. Authorization is required f ...   . METFORMIN HCL 1000 MG PO TABS  TAKE 1 TABLET BY MOUTH TWO TIMES A DAY 60 tablet 2    CYCLE FILL MEDICATION. Authorization is required f ...  . MULTIVITAMINS PO TABS Oral Take 2 tablets by mouth daily.     Marland Kitchen SIMVASTATIN 40 MG PO TABS Oral Take 40 mg by mouth at bedtime.     . ASPIRIN 81 MG PO TABS Oral Take 81 mg by mouth daily.     Marland Kitchen FAMOTIDINE 40 MG PO TABS Oral Take 40 mg by mouth daily.      Marland Kitchen METFORMIN HCL 1000 MG PO TABS  TAKE 1 TABLET BY MOUTH TWO TIMES A DAY 60 tablet 2    CYCLE FILL MEDICATION. Authorization is required f ...  . METHOCARBAMOL 500 MG PO TABS  500 mg. Take one tab bid prn muscle spasm    . PROMETHAZINE HCL 25 MG PO TABS Oral Take 25 mg by mouth every 6 (six) hours as needed.        BP 156/97  Pulse 90  Temp(Src) 98.7 F (37.1 C) (Oral)  Resp 18  Ht 5\' 6"  (1.676 m)  Wt 154 lb (69.854 kg)  BMI 24.86 kg/m2  SpO2  99%  Physical Exam  Constitutional: She is oriented to person, place, and time. She appears well-developed and well-nourished.  HENT:  Head: Normocephalic.  Mouth/Throat: No oropharyngeal exudate.  Eyes: EOM are normal. Right eye exhibits no discharge. Left eye exhibits no discharge.  Neck: Normal range of motion.  Cardiovascular: Regular rhythm.   Pulmonary/Chest: Effort normal.  Musculoskeletal: Normal range of motion.       2 palpable masses on the right wrist in the distal anterior lateral aspect each 0.75cm in diameter.  Neurological: She is alert and oriented to person, place, and time. No cranial nerve deficit.  Skin: Skin is warm and dry. No rash noted. No erythema.  Psychiatric: She has a normal mood and affect. Her behavior is normal.    ED Course  Procedures  OTHER DATA REVIEWED: Nursing notes, vital signs, and past medical records reviewed.   DIAGNOSTIC STUDIES: Oxygen Saturation is 99% on room air, normal by my interpretation.     ED COURSE / COORDINATION OF CARE: 09:08 - EDP examined patient's wrist and discussed possible  diagnoses with her. Prescriptions and work note given. Patient advised to consult with PCP.  MDM:  Patient has 2 cystic small lesions on anterior lateral aspect of right wrist. Suspected ganglion cyst IMPRESSION: Diagnoses that have been ruled out:  Diagnoses that are still under consideration:  Final diagnoses:    PLAN:  Home  The patient is to return the emergency department if there is any worsening of symptoms. I have reviewed the discharge instructions with the patient  CONDITION ON DISCHARGE: Stable  MEDICATIONS GIVEN IN THE E.D. Medications - No data to display  DISCHARGE MEDICATIONS: New Prescriptions   No medications on file    SCRIBE ATTESTATION: No exam data present.I personally performed the services described in this documentation, which was scribed in my presence. The recorded information has been reviewed and considered.   Donnetta Hutching, MD 09/10/11 1320

## 2011-09-11 ENCOUNTER — Other Ambulatory Visit: Payer: Self-pay | Admitting: Family Medicine

## 2011-09-11 NOTE — Telephone Encounter (Signed)
Refill request

## 2011-11-08 ENCOUNTER — Emergency Department (HOSPITAL_COMMUNITY)
Admission: EM | Admit: 2011-11-08 | Discharge: 2011-11-08 | Disposition: A | Discharge status: Home / Self Care | Payer: Worker's Compensation | Attending: Emergency Medicine | Admitting: Emergency Medicine

## 2011-11-08 ENCOUNTER — Emergency Department (HOSPITAL_COMMUNITY): Payer: Worker's Compensation

## 2011-11-08 ENCOUNTER — Encounter (HOSPITAL_COMMUNITY): Payer: Self-pay | Admitting: *Deleted

## 2011-11-08 DIAGNOSIS — S39012A Strain of muscle, fascia and tendon of lower back, initial encounter: Secondary | ICD-10-CM

## 2011-11-08 DIAGNOSIS — G43909 Migraine, unspecified, not intractable, without status migrainosus: Secondary | ICD-10-CM | POA: Insufficient documentation

## 2011-11-08 DIAGNOSIS — F329 Major depressive disorder, single episode, unspecified: Secondary | ICD-10-CM | POA: Insufficient documentation

## 2011-11-08 DIAGNOSIS — X500XXA Overexertion from strenuous movement or load, initial encounter: Secondary | ICD-10-CM | POA: Insufficient documentation

## 2011-11-08 DIAGNOSIS — Z853 Personal history of malignant neoplasm of breast: Secondary | ICD-10-CM | POA: Insufficient documentation

## 2011-11-08 DIAGNOSIS — S335XXA Sprain of ligaments of lumbar spine, initial encounter: Secondary | ICD-10-CM | POA: Insufficient documentation

## 2011-11-08 DIAGNOSIS — Y92009 Unspecified place in unspecified non-institutional (private) residence as the place of occurrence of the external cause: Secondary | ICD-10-CM | POA: Insufficient documentation

## 2011-11-08 DIAGNOSIS — F3289 Other specified depressive episodes: Secondary | ICD-10-CM | POA: Insufficient documentation

## 2011-11-08 DIAGNOSIS — E119 Type 2 diabetes mellitus without complications: Secondary | ICD-10-CM | POA: Insufficient documentation

## 2011-11-08 MED ORDER — CYCLOBENZAPRINE HCL 10 MG PO TABS: 10.0000 mg | ORAL_TABLET | Freq: Three times a day (TID) | ORAL | Status: AC | PRN

## 2011-11-08 MED ORDER — OXYCODONE-ACETAMINOPHEN 5-325 MG PO TABS: 1.0000 | ORAL_TABLET | ORAL | Status: AC | PRN

## 2011-11-08 MED ORDER — HYDROMORPHONE HCL PF 2 MG/ML IJ SOLN
2.0000 mg | Freq: Once | INTRAMUSCULAR | Status: AC
  Administered 2011-11-08: 2 mg via INTRAMUSCULAR
  Filled 2011-11-08: qty 1

## 2011-11-08 NOTE — ED Notes (Signed)
Pt c/o lower back pain; pt states she was at work x 2 days ago and was putting up stock and later that night it began to hurt; pt states today she was carrying a pot of water to the stove at work and felt like something pulled in lower back

## 2011-11-10 NOTE — ED Provider Notes (Signed)
History     CSN: [id number] Arrival date & time: 11/08/2011  9:10 AM   First MD Initiated Contact with Patient 11/08/11 [phone]      Chief Complaint  Patient presents with  . Back Pain    (Consider location/radiation/quality/duration/timing/severity/associated sxs/prior treatment) Patient is a 50 y.o. female presenting with back pain. The history is provided by the patient.  Back Pain  This is a new problem. The current episode started 2 days ago. The problem occurs constantly. The problem has not changed since onset.The pain is associated with lifting heavy objects (Pain started 2 days ago when she was lifting heavy pots at work.  She works as a Financial risk analyst.). The pain is present in the lumbar spine. The quality of the pain is described as stabbing and aching. The pain does not radiate. The pain is at a severity of 9/10. The pain is severe. The symptoms are aggravated by bending, twisting and certain positions. The pain is the same all the time. Pertinent negatives include no chest pain, no fever, no numbness, no headaches, no abdominal pain, no abdominal swelling, no bowel incontinence, no perianal numbness, no leg pain, no paresthesias, no paresis, no tingling and no weakness. She has tried analgesics for the symptoms. The treatment provided no relief. Risk factors include a history of cancer (Patient is a breast cancer survivor).    Past Medical History  Diagnosis Date  . Diabetes mellitus   . Breast cancer   . Depression   . Migraines   . Gall bladder stones     Past Surgical History  Procedure Date  . Mastectomy     Bilateral    Family History  Problem Relation Age of Onset  . Cancer Mother   . Heart failure Mother   . Diabetes Father   . Heart failure Father   . Cancer Father   . Stroke Father     History  Substance Use Topics  . Smoking status: Current Everyday Smoker -- 0.3 packs/day for 17 years    Types: Cigarettes  . Smokeless tobacco: Never Used  . Alcohol Use: No      OB History    Grav Para Term Preterm Abortions TAB SAB Ect Mult Living   2 1 1  1  1   1       Review of Systems  Constitutional: Negative for fever.  HENT: Negative for congestion, sore throat and neck pain.   Eyes: Negative.   Respiratory: Negative for chest tightness and shortness of breath.   Cardiovascular: Negative for chest pain.  Gastrointestinal: Negative for nausea, abdominal pain and bowel incontinence.  Genitourinary: Negative.   Musculoskeletal: Positive for back pain. Negative for joint swelling and arthralgias.  Skin: Negative.  Negative for rash and wound.  Neurological: Negative for dizziness, tingling, weakness, light-headedness, numbness, headaches and paresthesias.  Hematological: Negative.   Psychiatric/Behavioral: Negative.     Allergies  Nitrofurantoin and Simvastatin  Home Medications   Current Outpatient Rx  Name Route Sig Dispense Refill  . CITALOPRAM HYDROBROMIDE 40 MG PO TABS  TAKE 1 TABLET BY MOUTH ONCE DAILY 90 tablet 2    CYCLE FILL MEDICATION. Authorization is required f ...  . IBUPROFEN 200 MG PO TABS Oral Take 600 mg by mouth every 6 (six) hours as needed. For back pain     . METFORMIN HCL 1000 MG PO TABS  TAKE 1 TABLET BY MOUTH TWO TIMES A DAY 60 tablet 0    CYCLE FILL MEDICATION. Authorization is  required f ...  . MULTIVITAMINS PO TABS Oral Take 2 tablets by mouth 2 (two) times daily.     Marland Kitchen OMEPRAZOLE 20 MG PO CPDR Oral Take 20 mg by mouth daily.      Marland Kitchen PROMETHAZINE HCL 25 MG PO TABS Oral Take 25 mg by mouth every 6 (six) hours as needed.      . CYCLOBENZAPRINE HCL 10 MG PO TABS Oral Take 1 tablet (10 mg total) by mouth 3 (three) times daily as needed for muscle spasms. 15 tablet 0  . OXYCODONE-ACETAMINOPHEN 5-325 MG PO TABS Oral Take 1 tablet by mouth every 4 (four) hours as needed for pain. 20 tablet 0    BP 147/107  Pulse 87  Temp(Src) 97.2 F (36.2 C) (Oral)  Resp 20  Ht 5' 6.5" (1.689 m)  Wt 154 lb (69.854 kg)  BMI 24.48  kg/m2  SpO2 98%  Physical Exam  Nursing note and vitals reviewed. Constitutional: She is oriented to person, place, and time. She appears well-developed and well-nourished.  HENT:  Head: Normocephalic.  Eyes: Conjunctivae are normal.  Neck: Normal range of motion. Neck supple.  Cardiovascular: Regular rhythm and intact distal pulses.        Pedal pulses normal.  Pulmonary/Chest: Effort normal. She has no wheezes.  Abdominal: Soft. Bowel sounds are normal. She exhibits no distension and no mass.  Musculoskeletal: Normal range of motion. She exhibits no edema.       Lumbar back: She exhibits tenderness. She exhibits no swelling, no edema and no spasm.  Neurological: She is alert and oriented to person, place, and time. She has normal strength. She displays no atrophy and no tremor. No cranial nerve deficit or sensory deficit. Gait normal.  Reflex Scores:      Patellar reflexes are 2+ on the right side and 2+ on the left side.      Achilles reflexes are 2+ on the right side and 2+ on the left side.      No strength deficit noted in hip and knee flexor and extensor muscle groups.  Ankle flexion and extension intact.  Skin: Skin is warm and dry.  Psychiatric: She has a normal mood and affect.    ED Course  Procedures (including critical care time)  Labs Reviewed - No data to display Dg Lumbar Spine Complete  11/08/2011  *RADIOLOGY REPORT*  Clinical Data: Lifting injury.  Back pain.  LUMBAR SPINE - COMPLETE 4+ VIEW  Comparison: None  Findings: The lumbar vertebral bodies are normally aligned.  No acute fracture.  Mild degenerative changes.  The facets are normally aligned.  No pars defects.  Aortic calcifications are noted and advance for age.  Rounded radiodensity in the right upper quadrant is most likely a calcified gallstone.  The visualized bony pelvis is intact and the SI joints appear normal.  IMPRESSION:  1.  Mild degenerative changes but no acute bony findings. 2.  Age advanced  aortic calcifications. 3.  Calcified gallstone noted.  Original Report Authenticated By: P. Loralie Champagne, M.D.     1. Lumbar spine strain       MDM  Xrays obtained given h/o ca to rule out metastatic lesions.  Patient sx improved with dilaudid IM injection.  Recommended rest, heat,  Prescribed flexeril,  Oxycodone for sx relief.  F/u pcp for recheck in 1 week if not improved.        Candis Musa, PA 11/10/11 [phone]

## 2011-11-12 NOTE — ED Provider Notes (Signed)
Medical screening examination/treatment/procedure(s) were performed by non-physician practitioner and as supervising physician I was immediately available for consultation/collaboration.  Donnetta Hutching, MD 11/12/11 [phone]

## 2011-12-19 ENCOUNTER — Other Ambulatory Visit: Payer: Self-pay | Admitting: Family Medicine

## 2011-12-20 NOTE — Telephone Encounter (Signed)
Refill request

## 2012-02-05 ENCOUNTER — Telehealth: Payer: Self-pay | Admitting: *Deleted

## 2012-02-05 NOTE — Telephone Encounter (Signed)
Left message with mom for patient to return call. She has not been seen and had an A1C and LDL done in over 1 year. Please have her schedule an appointment with PCP to follow up on diabetes and have blood work done.Hermena Swint, Rodena Medin

## 2012-02-06 NOTE — Telephone Encounter (Signed)
Patient returned call to [facility] and scheduled her appt.

## 2012-02-20 ENCOUNTER — Ambulatory Visit (INDEPENDENT_AMBULATORY_CARE_PROVIDER_SITE_OTHER): Payer: Self-pay | Admitting: Family Medicine

## 2012-02-20 ENCOUNTER — Encounter: Payer: Self-pay | Admitting: Family Medicine

## 2012-02-20 VITALS — BP 100/68 | HR 76 | Temp 98.2°F | Ht 66.6 in | Wt 162.0 lb

## 2012-02-20 DIAGNOSIS — E119 Type 2 diabetes mellitus without complications: Secondary | ICD-10-CM

## 2012-02-20 LAB — POCT GLYCOSYLATED HEMOGLOBIN (HGB A1C): Hemoglobin A1C: 6.5

## 2012-02-20 MED ORDER — METFORMIN HCL 1000 MG PO TABS: 1000.0000 mg | ORAL_TABLET | Freq: Two times a day (BID) | ORAL | Status: DC

## 2012-02-20 MED ORDER — CITALOPRAM HYDROBROMIDE 40 MG PO TABS: 40.0000 mg | ORAL_TABLET | Freq: Every day | ORAL | Status: DC

## 2012-02-20 NOTE — Progress Notes (Signed)
  Subjective:    Patient ID: Makayla Owens, female    DOB: 11/17/1961, 51 y.o.   MRN: [id number]  HPI S/P double mastectomy.  No mammograms.  Long time since seen by me.  Fortunately, A1C at goal.  Feels fine    Review of Systems Denies CP, bleeding syncope, SOB     Objective:   Physical ExamHEENT nl Neck supple without nodes Lungs clear Cardiac RRR Abd benign        Assessment & Plan:

## 2012-02-22 NOTE — Assessment & Plan Note (Signed)
Good control

## 2012-02-22 NOTE — Patient Instructions (Signed)
Diabetes is under good control You need a pap soon You will need your cholesterol checked soon Come back in  2 months

## 2012-02-22 NOTE — Assessment & Plan Note (Signed)
>>  ASSESSMENT AND PLAN FOR DM (DIABETES MELLITUS), TYPE 2 WITH RENAL COMPLICATIONS (HCC) WRITTEN ON 02/22/2012 10:22 PM BY HENSEL, Richrd Prime, MD  Good control

## 2012-03-12 ENCOUNTER — Encounter: Payer: Self-pay | Admitting: Family Medicine

## 2012-04-16 ENCOUNTER — Encounter (HOSPITAL_COMMUNITY): Payer: Self-pay | Admitting: Emergency Medicine

## 2012-04-16 ENCOUNTER — Emergency Department (INDEPENDENT_AMBULATORY_CARE_PROVIDER_SITE_OTHER)
Admission: EM | Admit: 2012-04-16 | Discharge: 2012-04-16 | Disposition: A | Discharge status: Home / Self Care | Payer: Self-pay | Source: Home / Self Care | Attending: Family Medicine | Admitting: Family Medicine

## 2012-04-16 ENCOUNTER — Emergency Department (HOSPITAL_COMMUNITY)
Admission: EM | Admit: 2012-04-16 | Discharge: 2012-04-16 | Disposition: A | Discharge status: Home / Self Care | Payer: Self-pay | Attending: Emergency Medicine | Admitting: Emergency Medicine

## 2012-04-16 ENCOUNTER — Encounter (HOSPITAL_COMMUNITY): Payer: Self-pay | Admitting: *Deleted

## 2012-04-16 DIAGNOSIS — R1011 Right upper quadrant pain: Secondary | ICD-10-CM

## 2012-04-16 DIAGNOSIS — R109 Unspecified abdominal pain: Secondary | ICD-10-CM | POA: Insufficient documentation

## 2012-04-16 LAB — POCT I-STAT, CHEM 8
Calcium, Ion: 1.22 mmol/L (ref 1.12–1.32)
Chloride: 106 mEq/L (ref 96–112)
Glucose, Bld: 86 mg/dL (ref 70–99)
HCT: 41 % (ref 36.0–46.0)
Hemoglobin: 13.9 g/dL (ref 12.0–15.0)
TCO2: 24 mmol/L (ref 0–100)

## 2012-04-16 MED ORDER — ONDANSETRON 4 MG PO TBDP
8.0000 mg | ORAL_TABLET | Freq: Once | ORAL | Status: AC
  Administered 2012-04-16: 8 mg via ORAL

## 2012-04-16 MED ORDER — ONDANSETRON 4 MG PO TBDP
ORAL_TABLET | ORAL | Status: AC
  Filled 2012-04-16: qty 2

## 2012-04-16 NOTE — ED Notes (Signed)
Dr. Alfonse Ras notified of pt.

## 2012-04-16 NOTE — ED Notes (Signed)
Pt with hx of gallstones.  States she is having R and L upper quadrant pain, nausea.  States it feels like all her other gallstone exacerbations.  States that every time she comes here they tell her they can't do surgery until it gets infected b/c she doesn't have insurance.

## 2012-04-16 NOTE — ED Notes (Signed)
Pt remains awake alert/ informed of transfer

## 2012-04-16 NOTE — ED Notes (Signed)
Pt c/o of epigastric pain, nausea, vomiting "few days" states was shaking "real bad" today.

## 2012-04-16 NOTE — ED Provider Notes (Signed)
History     CSN: [id number]  Arrival date & time 04/16/12  1810   First MD Initiated Contact with Patient 04/16/12 1811      Chief Complaint  Patient presents with  . Abdominal Pain  . Emesis  . Shaking    (Consider location/radiation/quality/duration/timing/severity/associated sxs/prior treatment) HPI Comments: 51 year old diabetic, smoker, breast cancer survival female with history of gallbladder stones. Here complaining of epigastric and right upper quadrant pain, nausea and vomiting in the last 2 days. No melena. Last BM in am today soft. Symptoms worse in afternoon today. Reports two episodes of brown emesis today, last time about 2 hours ago. Still nauseous and complains of body shaking.  Patient is a 51 y.o. female presenting with vomiting.  Emesis  Associated symptoms include abdominal pain and chills. Pertinent negatives include no cough, no diarrhea and no fever.    Past Medical History  Diagnosis Date  . Diabetes mellitus   . Breast cancer   . Depression   . Migraines   . Gall bladder stones     Past Surgical History  Procedure Date  . Mastectomy     Bilateral    Family History  Problem Relation Age of Onset  . Cancer Mother   . Heart failure Mother   . Diabetes Father   . Heart failure Father   . Cancer Father   . Stroke Father     History  Substance Use Topics  . Smoking status: Current Everyday Smoker -- 0.3 packs/day for 17 years    Types: Cigarettes  . Smokeless tobacco: Never Used  . Alcohol Use: No    OB History    Grav Para Term Preterm Abortions TAB SAB Ect Mult Living   2 1 1  1  1   1       Review of Systems  Constitutional: Positive for chills. Negative for fever.  Respiratory: Negative for cough and shortness of breath.   Cardiovascular:       Reports intermittent chest pain  Gastrointestinal: Positive for nausea, vomiting and abdominal pain. Negative for diarrhea, constipation and blood in stool.  Musculoskeletal:   Generalized body aches.  Skin: Negative for color change and rash.    Allergies  Nitrofurantoin and Simvastatin  Home Medications   Current Outpatient Rx  Name Route Sig Dispense Refill  . CITALOPRAM HYDROBROMIDE 40 MG PO TABS Oral Take 1 tablet (40 mg total) by mouth daily. 90 tablet 3  . IBUPROFEN 200 MG PO TABS Oral Take 600 mg by mouth every 6 (six) hours as needed. For back pain     . METFORMIN HCL 1000 MG PO TABS Oral Take 1 tablet (1,000 mg total) by mouth 2 (two) times daily with a meal. 180 tablet 0  . MULTIVITAMINS PO TABS Oral Take 2 tablets by mouth 2 (two) times daily.     Marland Kitchen PROMETHAZINE HCL 25 MG PO TABS Oral Take 25 mg by mouth every 6 (six) hours as needed.        BP 134/78  Pulse 86  Temp(Src) 97.8 F (36.6 C) (Oral)  Resp 18  SpO2 97%  Physical Exam  Nursing note and vitals reviewed. Constitutional: She is oriented to person, place, and time. She appears well-developed and well-nourished.       Laying in bed appears distressed with generalized tremors.  HENT:  Head: Normocephalic and atraumatic.  Mouth/Throat: Oropharynx is clear and moist.  Eyes: Pupils are equal, round, and reactive to light. No scleral icterus.  Neck: No JVD present.  Cardiovascular: Normal rate, regular rhythm, normal heart sounds and intact distal pulses.   Pulmonary/Chest: Breath sounds normal. She has no wheezes.  Abdominal: Soft.       Bowel sounds normal.  Epigastric and RUQ tenderness to palpation. Pain with palpation at [facility] point; but I cannot palpate gallbladder or liver margin. No guarding or rigidity. No peritoneal reaction.  Lymphadenopathy:    She has no cervical adenopathy.  Neurological: She is alert and oriented to person, place, and time.  Skin: Skin is warm.  Psychiatric:       Impress anxious    ED Course  Procedures (including critical care time)   Labs Reviewed  POCT I-STAT, CHEM 8   No results found.   1. RUQ abdominal pain       MDM    Diabetic with epigastric and right upper quadrant pain and brown emesis today (h/o gallbladder stones). I stat with normal electrolytes and glucose level of 88. On exam epigastric and right upper cord and tenderness.  Impress positive Eulah Pont. Gallbladder or liver no palpable. No jaundice. Afebrile. Generalized resting tremors. Vital signs stable. EKG: Rate 88. NSR. No ischemic changes. Decided to transfer via shuttle to the emergency department for further evaluation and management. Pt received ondansetron 8 mg sublingual prior discharge.         Sharin Grave, MD 04/17/12 [phone]

## 2012-04-18 ENCOUNTER — Encounter: Payer: Self-pay | Admitting: Family Medicine

## 2012-05-05 ENCOUNTER — Other Ambulatory Visit: Payer: Self-pay | Admitting: Family Medicine

## 2012-05-09 ENCOUNTER — Encounter: Payer: Self-pay | Admitting: Family Medicine

## 2012-05-30 ENCOUNTER — Encounter: Payer: Self-pay | Admitting: Family Medicine

## 2012-05-30 ENCOUNTER — Other Ambulatory Visit (HOSPITAL_COMMUNITY)
Admission: RE | Admit: 2012-05-30 | Discharge: 2012-05-30 | Disposition: A | Discharge status: Home / Self Care | Payer: Self-pay | Source: Ambulatory Visit | Attending: Family Medicine | Admitting: Family Medicine

## 2012-05-30 ENCOUNTER — Ambulatory Visit (INDEPENDENT_AMBULATORY_CARE_PROVIDER_SITE_OTHER): Payer: Self-pay | Admitting: Family Medicine

## 2012-05-30 VITALS — BP 132/87 | HR 80 | Temp 97.2°F | Ht 66.81 in | Wt 165.4 lb

## 2012-05-30 DIAGNOSIS — Z124 Encounter for screening for malignant neoplasm of cervix: Secondary | ICD-10-CM | POA: Insufficient documentation

## 2012-05-30 DIAGNOSIS — E119 Type 2 diabetes mellitus without complications: Secondary | ICD-10-CM

## 2012-05-30 DIAGNOSIS — E7800 Pure hypercholesterolemia, unspecified: Secondary | ICD-10-CM

## 2012-05-30 LAB — LIPID PANEL
Cholesterol: 267 mg/dL — ABNORMAL HIGH (ref 0–200)
LDL Cholesterol: 196 mg/dL — ABNORMAL HIGH (ref 0–99)
Total CHOL/HDL Ratio: 4.9 Ratio
VLDL: 17 mg/dL (ref 0–40)

## 2012-05-30 NOTE — Assessment & Plan Note (Addendum)
Check labs, may need statin 7/8 Called and discussed. LDL markedly elevated off statin.  Will Rx with atorvastatin.

## 2012-05-30 NOTE — Patient Instructions (Addendum)
I will call in 2 weeks with lab results. You may need to be on another cholesterol medicine.  See me around October.  We can give you a flu shot and may be able to do the eye screen by then.

## 2012-05-30 NOTE — Progress Notes (Signed)
  Subjective:    Patient ID: Makayla Owens, female    DOB: [birth year], 51 y.o.   MRN: [id number]  HPI  Here for annual wellness visit.  No Active complaints.  Despises pelvic exams but knows she is due.  Also has hypercholesterolemia, was intolerant of simvastatin due to muscle aches and is now on nothing.  Needs recheck.  Has diabetes which has been well controled.  No HBP so not on ACE.  Needs microalbumin.  Also needs eye exam but cannot afford.      Review of Systems Denies SOB, Chest pain wt loss, bleeding swelling.     Objective:   Physical Exam HEENT normal Neck supple without nodes Lungs clear Cardiac RRR without m or g Abd benign. Pelvic WNL Ext normal without edema        Assessment & Plan:

## 2012-05-30 NOTE — Assessment & Plan Note (Addendum)
Check A1C and urine microalbumin.  Will return in 3 months, by then hopefully we will be able to use retinal scanner for DM screen.

## 2012-05-30 NOTE — Assessment & Plan Note (Signed)
>>  ASSESSMENT AND PLAN FOR DM (DIABETES MELLITUS), TYPE 2 WITH RENAL COMPLICATIONS (HCC) WRITTEN ON 05/30/2012  2:54 PM BY HENSEL, Richrd Prime, MD  Check A1C and urine microalbumin.  Will return in 3 months, by then hopefully we will be able to use retinal scanner for DM screen.

## 2012-05-31 LAB — MICROALBUMIN / CREATININE URINE RATIO: Microalb, Ur: 0.5 mg/dL (ref 0.00–1.89)

## 2012-06-09 MED ORDER — ATORVASTATIN CALCIUM 40 MG PO TABS: 40.0000 mg | ORAL_TABLET | Freq: Every day | ORAL | Status: DC

## 2012-06-09 NOTE — Addendum Note (Signed)
Addended by: Tivis Ringer on: 06/09/2012 05:10 PM   Modules accepted: Orders

## 2012-06-23 ENCOUNTER — Telehealth: Payer: Self-pay | Admitting: Family Medicine

## 2012-06-23 DIAGNOSIS — E7800 Pure hypercholesterolemia, unspecified: Secondary | ICD-10-CM

## 2012-06-23 MED ORDER — PRAVASTATIN SODIUM 40 MG PO TABS: 40.0000 mg | ORAL_TABLET | Freq: Every evening | ORAL | Status: DC

## 2012-06-23 NOTE — Assessment & Plan Note (Signed)
Could not tolerate atorvastatin now and simvastatin in the past.  Will try lower potency pravachol.  Unlikely to get to goal, but I want her on some statin if possible due to Type A evidence.

## 2012-06-23 NOTE — Telephone Encounter (Signed)
Not able to tolerate the lipitor.  Causing nausea,vomitting and muscle pain.  Please call mom back or daughter to discuss change

## 2012-06-25 ENCOUNTER — Telehealth: Payer: Self-pay | Admitting: Family Medicine

## 2012-06-25 NOTE — Telephone Encounter (Signed)
Yes we will take him in our practice with the usual prerequisites.

## 2012-06-25 NOTE — Telephone Encounter (Signed)
Ms. Susette Racer called in ask if you are willing to take her son-in-law as a new patient.  His provider was at [facility], but has left.  Wanted to have him come here also.  Please call her or her daughter to let them know if this is okay.

## 2012-07-18 ENCOUNTER — Encounter: Payer: Self-pay | Admitting: Family Medicine

## 2012-07-18 ENCOUNTER — Ambulatory Visit (INDEPENDENT_AMBULATORY_CARE_PROVIDER_SITE_OTHER): Payer: Self-pay | Admitting: Family Medicine

## 2012-07-18 VITALS — BP 138/80 | HR 85 | Wt 164.2 lb

## 2012-07-18 DIAGNOSIS — K8020 Calculus of gallbladder without cholecystitis without obstruction: Secondary | ICD-10-CM

## 2012-07-18 DIAGNOSIS — M797 Fibromyalgia: Secondary | ICD-10-CM | POA: Insufficient documentation

## 2012-07-18 DIAGNOSIS — E119 Type 2 diabetes mellitus without complications: Secondary | ICD-10-CM

## 2012-07-18 DIAGNOSIS — F329 Major depressive disorder, single episode, unspecified: Secondary | ICD-10-CM

## 2012-07-18 DIAGNOSIS — R52 Pain, unspecified: Secondary | ICD-10-CM

## 2012-07-18 NOTE — Assessment & Plan Note (Signed)
Likely multifactoreal etiology.  Has obvious ganglion cyst at wrist.  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  She is likely physiologically older than her stated age of 32 as a result of her breast cancer and its treatment.  Acute abd wall strain.  She likely also has some osteoarthritis.

## 2012-07-18 NOTE — Assessment & Plan Note (Signed)
Has intermitant pain which she attributes to her gall bladder.  She has no insurance and has not been able to get it removed.

## 2012-07-18 NOTE — Assessment & Plan Note (Signed)
>>  ASSESSMENT AND PLAN FOR DM (DIABETES MELLITUS), TYPE 2 WITH RENAL COMPLICATIONS (HCC) WRITTEN ON 07/18/2012  4:28 PM BY HENSEL, Richrd Prime, MD  Recent A1C at goal

## 2012-07-18 NOTE — Progress Notes (Signed)
  Subjective:    Patient ID: Makayla Owens, female    DOB: [birth year], 51 y.o.   MRN: [id number]  HPI Patient has a host of complaints: 1. Lt lower quadrant pain.  Worse with movement.  Had previous pelvic pain with cysts.  She has been menopausal x 2 years since chemo. 2. Back pain, made worse by work 3. Pain in right wrist and cysts 4. Diffuse pain especially at the end of the workday (she is a Child psychotherapist)  5. Fatigue extreme. 6. Difficult social situation.  Husband fully disabled due to end stage CHF.   7. Difficulty sleeping, depressed mood.    Review of Systems  Denies bleeding, DOE, chest pain, change in appetite, bowel or bladder.     Objective:   Physical Exam Heent nl Neck supple without masses Lungs clear, Cardiac RRR without m or g Abd, Lt L Q pain which on exam feels more musclar than deep.   Right wrist ganglion cyst x 2 near radial artery.        Assessment & Plan:

## 2012-07-18 NOTE — Patient Instructions (Addendum)
I am sorry you feel bad.  I don't have a pill for that. Out of work for two weeks.  Figure out your long term plan See me in Oct for cholesterol, DM and flu shot

## 2012-07-18 NOTE — Assessment & Plan Note (Signed)
Recent A1C at goal 

## 2012-07-18 NOTE — Assessment & Plan Note (Signed)
Worsened with current social situration

## 2012-09-12 ENCOUNTER — Ambulatory Visit: Payer: Self-pay | Admitting: Family Medicine

## 2013-03-09 ENCOUNTER — Other Ambulatory Visit: Payer: Self-pay | Admitting: Family Medicine

## 2013-06-02 ENCOUNTER — Other Ambulatory Visit: Payer: Self-pay | Admitting: *Deleted

## 2013-06-02 MED ORDER — METFORMIN HCL 1000 MG PO TABS: ORAL_TABLET | ORAL | Status: DC

## 2013-06-04 ENCOUNTER — Other Ambulatory Visit: Payer: Self-pay | Admitting: Family Medicine

## 2013-06-11 ENCOUNTER — Other Ambulatory Visit: Payer: Self-pay

## 2013-06-24 ENCOUNTER — Encounter (HOSPITAL_COMMUNITY): Payer: Self-pay | Admitting: *Deleted

## 2013-06-24 ENCOUNTER — Emergency Department (HOSPITAL_COMMUNITY)
Admission: EM | Admit: 2013-06-24 | Discharge: 2013-06-24 | Disposition: A | Discharge status: Home / Self Care | Payer: Self-pay | Attending: Emergency Medicine | Admitting: Emergency Medicine

## 2013-06-24 DIAGNOSIS — F17200 Nicotine dependence, unspecified, uncomplicated: Secondary | ICD-10-CM | POA: Insufficient documentation

## 2013-06-24 DIAGNOSIS — Z8719 Personal history of other diseases of the digestive system: Secondary | ICD-10-CM | POA: Insufficient documentation

## 2013-06-24 DIAGNOSIS — Z79899 Other long term (current) drug therapy: Secondary | ICD-10-CM | POA: Insufficient documentation

## 2013-06-24 DIAGNOSIS — F329 Major depressive disorder, single episode, unspecified: Secondary | ICD-10-CM | POA: Insufficient documentation

## 2013-06-24 DIAGNOSIS — Z8679 Personal history of other diseases of the circulatory system: Secondary | ICD-10-CM | POA: Insufficient documentation

## 2013-06-24 DIAGNOSIS — E119 Type 2 diabetes mellitus without complications: Secondary | ICD-10-CM | POA: Insufficient documentation

## 2013-06-24 DIAGNOSIS — Z853 Personal history of malignant neoplasm of breast: Secondary | ICD-10-CM | POA: Insufficient documentation

## 2013-06-24 DIAGNOSIS — N9489 Other specified conditions associated with female genital organs and menstrual cycle: Secondary | ICD-10-CM | POA: Insufficient documentation

## 2013-06-24 DIAGNOSIS — F3289 Other specified depressive episodes: Secondary | ICD-10-CM | POA: Insufficient documentation

## 2013-06-24 DIAGNOSIS — N760 Acute vaginitis: Secondary | ICD-10-CM | POA: Insufficient documentation

## 2013-06-24 MED ORDER — HYDROCODONE-ACETAMINOPHEN 5-325 MG PO TABS: ORAL_TABLET | ORAL | Status: DC

## 2013-06-24 MED ORDER — DOXYCYCLINE HYCLATE 100 MG PO TABS: 100.0000 mg | ORAL_TABLET | Freq: Two times a day (BID) | ORAL | Status: DC

## 2013-06-24 NOTE — ED Provider Notes (Signed)
History    CSN: [id number] Arrival date & time 06/24/13  1839  First MD Initiated Contact with Patient 06/24/13 1924     Chief Complaint  Patient presents with  . Abscess   HPI Pt was seen at 1935.  Per pt, c/o gradual onset and persistence of constant right labial "pain" for the past 3 to 4 days. Pt states it "started out as a bump" that "got bigger." Pt states it "opened then drained pus." States the area continues to "be swollen." Denies fevers, no vaginal discharge, no dysuria, no injury, no abd pain.    Past Medical History  Diagnosis Date  . Diabetes mellitus   . Breast cancer   . Depression   . Migraines   . Gall bladder stones    Past Surgical History  Procedure Laterality Date  . Mastectomy      Bilateral   Family History  Problem Relation Age of Onset  . Cancer Mother   . Heart failure Mother   . Diabetes Father   . Heart failure Father   . Cancer Father   . Stroke Father    History  Substance Use Topics  . Smoking status: Current Every Day Smoker -- 0.50 packs/day for 17 years    Types: Cigarettes  . Smokeless tobacco: Never Used  . Alcohol Use: No   OB History   Grav Para Term Preterm Abortions TAB SAB Ect Mult Living   2 1 1  1  1   1      Review of Systems ROS: Statement: All systems negative except as marked or noted in the HPI; Constitutional: Negative for fever and chills. ; ; Eyes: Negative for eye pain, redness and discharge. ; ; ENMT: Negative for ear pain, hoarseness, nasal congestion, sinus pressure and sore throat. ; ; Cardiovascular: Negative for chest pain, palpitations, diaphoresis, dyspnea and peripheral edema. ; ; Respiratory: Negative for cough, wheezing and stridor. ; ; Gastrointestinal: Negative for nausea, vomiting, diarrhea, abdominal pain, blood in stool, hematemesis, jaundice and rectal bleeding. ; ; Genitourinary: +right labial swelling. Negative for dysuria, flank pain and hematuria. ; ; Musculoskeletal: Negative for back pain and  neck pain. Negative for swelling and trauma.; ; Skin: Negative for pruritus, rash, abrasions, blisters, bruising and skin lesion.; ; Neuro: Negative for headache, lightheadedness and neck stiffness. Negative for weakness, altered level of consciousness , altered mental status, extremity weakness, paresthesias, involuntary movement, seizure and syncope.       Allergies  Nitrofurantoin; Lipitor; and Simvastatin  Home Medications   Current Outpatient Rx  Name  Route  Sig  Dispense  Refill  . citalopram (CELEXA) 40 MG tablet   Oral   Take 40 mg by mouth daily.         Marland Kitchen ibuprofen (ADVIL,MOTRIN) 200 MG tablet   Oral   Take 600 mg by mouth every 6 (six) hours as needed. For back pain          . metFORMIN (GLUCOPHAGE) 1000 MG tablet   Oral   Take 1,000 mg by mouth 2 (two) times daily.         . miconazole (NEOSPORIN AF) 2 % cream   Topical   Apply 1 application topically as needed (for pain).         . Multiple Vitamin (MULTIVITAMIN WITH MINERALS) TABS   Oral   Take 1 tablet by mouth daily.         Marland Kitchen doxycycline (VIBRA-TABS) 100 MG tablet   Oral  Take 1 tablet (100 mg total) by mouth 2 (two) times daily.   14 tablet   0   . HYDROcodone-acetaminophen (NORCO/VICODIN) 5-325 MG per tablet      1 or 2 tabs PO q6 hours prn pain   15 tablet   0    BP 134/76  Pulse 85  Temp(Src) 98.6 F (37 C) (Oral)  Resp 20  Ht 5\' 6"  (1.676 m)  Wt 169 lb 11.2 oz (76.975 kg)  BMI 27.4 kg/m2  SpO2 99% Physical Exam 1940: Physical examination:  Nursing notes reviewed; Vital signs and O2 SAT reviewed;  Constitutional: Well developed, Well nourished, Well hydrated, In no acute distress; Head:  Normocephalic, atraumatic; Eyes: EOMI, PERRL, No scleral icterus; ENMT: Mouth and pharynx normal, Mucous membranes moist; Neck: Supple, Full range of motion, No lymphadenopathy; Cardiovascular: Regular rate and rhythm, No murmur, rub, or gallop; Respiratory: Breath sounds clear & equal  bilaterally, No rales, rhonchi, wheezes.  Speaking full sentences with ease, Normal respiratory effort/excursion; Chest: Nontender, Movement normal; Abdomen: Soft, Nontender, Nondistended, Normal bowel sounds; Genitourinary: No CVA tenderness. Perineal area evaluated with ED RN chaperone and pt permission. Right external labia with mild erythema and induration. No fluctuance. +small central open area on medial labia that is spontaneously draining a small amount of serosanguinous fluid. No purulent fluid able to be expressed.;;; Extremities: Pulses normal, No tenderness, No edema, No calf edema or asymmetry.; Neuro: AA&Ox3, Major CN grossly intact.  Speech clear. Climbs on and off stretcher easily by herself. Gait steady. No gross focal motor or sensory deficits in extremities.; Skin: Color normal, Warm, Dry.   ED Course  Procedures   MDM  MDM Reviewed: previous chart, nursing note and vitals   2015:  Central open area on medial labia spontaneously draining. D/W pt regarding treatment. Pt would like to defer I&D at this time, requesting rx abx and she will apply warm compresses to encourage the area to drain further. Agrees to return to ED or her PMD/OB-GYN doctor within the next 2 days for a re-check.     Laray Anger, DO 06/27/13 1530

## 2013-06-24 NOTE — ED Notes (Signed)
Pt reporting abscess in perineal area.  Increased pain and swelling over past day.

## 2013-06-24 NOTE — ED Notes (Signed)
Pt presents with an abscess on upper right labia x 3-4 days. Pt states has ' popped it and clear fluid came out, denies foul smell and drainage, and fever at this time. EDP at bedside examined said abscess. Pt tolerated examine well.

## 2013-08-07 ENCOUNTER — Encounter: Payer: Self-pay | Admitting: Family Medicine

## 2013-08-07 ENCOUNTER — Ambulatory Visit (INDEPENDENT_AMBULATORY_CARE_PROVIDER_SITE_OTHER): Payer: Self-pay | Admitting: Family Medicine

## 2013-08-07 VITALS — BP 109/72 | HR 77 | Temp 98.1°F | Ht 66.0 in | Wt 167.7 lb

## 2013-08-07 DIAGNOSIS — Z23 Encounter for immunization: Secondary | ICD-10-CM

## 2013-08-07 DIAGNOSIS — E7800 Pure hypercholesterolemia, unspecified: Secondary | ICD-10-CM

## 2013-08-07 DIAGNOSIS — K8020 Calculus of gallbladder without cholecystitis without obstruction: Secondary | ICD-10-CM

## 2013-08-07 DIAGNOSIS — F329 Major depressive disorder, single episode, unspecified: Secondary | ICD-10-CM

## 2013-08-07 DIAGNOSIS — E119 Type 2 diabetes mellitus without complications: Secondary | ICD-10-CM

## 2013-08-07 DIAGNOSIS — F3289 Other specified depressive episodes: Secondary | ICD-10-CM

## 2013-08-07 DIAGNOSIS — R52 Pain, unspecified: Secondary | ICD-10-CM

## 2013-08-07 LAB — POCT GLYCOSYLATED HEMOGLOBIN (HGB A1C): Hemoglobin A1C: 6

## 2013-08-07 MED ORDER — AMITRIPTYLINE HCL 50 MG PO TABS: 50.0000 mg | ORAL_TABLET | Freq: Three times a day (TID) | ORAL | Status: DC

## 2013-08-07 MED ORDER — PRAVASTATIN SODIUM 40 MG PO TABS: 40.0000 mg | ORAL_TABLET | Freq: Every day | ORAL | Status: DC

## 2013-08-07 MED ORDER — PROMETHAZINE HCL 25 MG PO TABS: 25.0000 mg | ORAL_TABLET | Freq: Four times a day (QID) | ORAL | Status: DC | PRN

## 2013-08-07 NOTE — Assessment & Plan Note (Signed)
Start on low potency statin since side effects with high potency.

## 2013-08-07 NOTE — Assessment & Plan Note (Signed)
>>  ASSESSMENT AND PLAN FOR DM (DIABETES MELLITUS), TYPE 2 WITH RENAL COMPLICATIONS (HCC) WRITTEN ON 08/07/2013  2:11 PM BY HENSEL, Richrd Prime, MD  Good control

## 2013-08-07 NOTE — Patient Instructions (Addendum)
First, start on the new cholesterol medicine, pravastatin.  Take it for two weeks before starting the amitriptyline. The amitriptyline is a funny medication.  It MIGHT do three helpful things for you. It might help the depression and irritability It might help the pain. It will very likely help your sleep. Start by taking just one pill at night.  If you tolerate the drug, build up slowly to one pill in the morning and two pills at night.

## 2013-08-07 NOTE — Progress Notes (Signed)
  Subjective:    Patient ID: Makayla Owens, female    DOB: 08-12-1961, 53 y.o.   MRN: [id number]  HPI  Feels bad Does not sleep.  Difficulty in falling asleep. Describes it as possible anxiety and being "pissed off" at everything. Hurts all over.  Worse in lower back (spine), butt cheeks (muscles), knees (joints) and occasionally feet.   Feels more depressed  Still with gall bladder.  No insurance - cannot get surgery.   Has applied for disability.  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 Could not take simvastatin. Still pays out of pocket for everything.   Review of Systems     Objective:   Physical Exam Generally looks good.   Lungs clear Cardiac RRR without m or g Abd benign. Diffuse nonspecific painful areas. Normal pulses and sensation with diabetic foot exam.       Assessment & Plan:

## 2013-08-07 NOTE — Assessment & Plan Note (Signed)
Worsened with insomnia.  Elavil will hopefully help.

## 2013-08-07 NOTE — Assessment & Plan Note (Signed)
Needs phenergan refill for her episodic flair ups until can get surg.

## 2013-08-07 NOTE — Assessment & Plan Note (Signed)
Good control

## 2013-08-08 LAB — MICROALBUMIN / CREATININE URINE RATIO: Microalb, Ur: 0.5 mg/dL (ref 0.00–1.89)

## 2013-09-22 ENCOUNTER — Emergency Department (HOSPITAL_COMMUNITY)
Admission: EM | Admit: 2013-09-22 | Discharge: 2013-09-22 | Disposition: A | Discharge status: Home / Self Care | Payer: Self-pay | Attending: Emergency Medicine | Admitting: Emergency Medicine

## 2013-09-22 ENCOUNTER — Encounter (HOSPITAL_COMMUNITY): Payer: Self-pay | Admitting: Emergency Medicine

## 2013-09-22 DIAGNOSIS — R51 Headache: Secondary | ICD-10-CM | POA: Insufficient documentation

## 2013-09-22 DIAGNOSIS — M549 Dorsalgia, unspecified: Secondary | ICD-10-CM

## 2013-09-22 DIAGNOSIS — F3289 Other specified depressive episodes: Secondary | ICD-10-CM | POA: Insufficient documentation

## 2013-09-22 DIAGNOSIS — E119 Type 2 diabetes mellitus without complications: Secondary | ICD-10-CM | POA: Insufficient documentation

## 2013-09-22 DIAGNOSIS — N390 Urinary tract infection, site not specified: Secondary | ICD-10-CM | POA: Insufficient documentation

## 2013-09-22 DIAGNOSIS — Z8719 Personal history of other diseases of the digestive system: Secondary | ICD-10-CM | POA: Insufficient documentation

## 2013-09-22 DIAGNOSIS — R112 Nausea with vomiting, unspecified: Secondary | ICD-10-CM | POA: Insufficient documentation

## 2013-09-22 DIAGNOSIS — F329 Major depressive disorder, single episode, unspecified: Secondary | ICD-10-CM | POA: Insufficient documentation

## 2013-09-22 DIAGNOSIS — Z79899 Other long term (current) drug therapy: Secondary | ICD-10-CM | POA: Insufficient documentation

## 2013-09-22 DIAGNOSIS — F17200 Nicotine dependence, unspecified, uncomplicated: Secondary | ICD-10-CM | POA: Insufficient documentation

## 2013-09-22 DIAGNOSIS — Z853 Personal history of malignant neoplasm of breast: Secondary | ICD-10-CM | POA: Insufficient documentation

## 2013-09-22 DIAGNOSIS — G43909 Migraine, unspecified, not intractable, without status migrainosus: Secondary | ICD-10-CM | POA: Insufficient documentation

## 2013-09-22 LAB — URINALYSIS, ROUTINE W REFLEX MICROSCOPIC
Glucose, UA: NEGATIVE mg/dL
Hgb urine dipstick: NEGATIVE
Ketones, ur: NEGATIVE mg/dL
Leukocytes, UA: NEGATIVE
pH: 6 (ref 5.0–8.0)

## 2013-09-22 LAB — GLUCOSE, CAPILLARY: Glucose-Capillary: 120 mg/dL — ABNORMAL HIGH (ref 70–99)

## 2013-09-22 LAB — URINE MICROSCOPIC-ADD ON

## 2013-09-22 MED ORDER — CEPHALEXIN 500 MG PO CAPS: 500.0000 mg | ORAL_CAPSULE | Freq: Four times a day (QID) | ORAL | Status: DC

## 2013-09-22 MED ORDER — HYDROCODONE-ACETAMINOPHEN 5-325 MG PO TABS: 1.0000 | ORAL_TABLET | ORAL | Status: DC | PRN

## 2013-09-22 NOTE — ED Provider Notes (Signed)
Medical screening examination/treatment/procedure(s) were performed by non-physician practitioner and as supervising physician I was immediately available for consultation/collaboration.   Layla Maw Dalya Maselli, DO 09/22/13 1510

## 2013-09-22 NOTE — ED Notes (Signed)
Pt c/o burning with urination 3 days ago. Started with lower back pain and pressure today. Vomited x 1 today. Nad. Mm wet.

## 2013-09-22 NOTE — Progress Notes (Signed)
ED/CM noted patient did not have health insurance and/or PCP listed in the computer.  Patient was given the [place] resource handout with information on the clinics, food pantries, and the handout for new health insurance sign-up.  Patient expressed appreciation for this. 

## 2013-09-22 NOTE — ED Provider Notes (Signed)
CSN: [id number]     Arrival date & time 09/22/13  5621 History   First MD Initiated Contact with Patient 09/22/13 [phone]     Chief Complaint  Patient presents with  . Dysuria   (Consider location/radiation/quality/duration/timing/severity/associated sxs/prior Treatment) Patient is a 52 y.o. female presenting with dysuria. The history is provided by the patient.  Dysuria Pain quality:  Burning Pain severity:  Moderate Onset quality:  Gradual Duration:  3 days Timing:  Constant Progression:  Worsening Chronicity:  New Relieved by:  Nothing Worsened by:  Nothing tried Urinary symptoms: frequent urination   Urinary symptoms: no hematuria and no bladder incontinence   Associated symptoms: nausea and vomiting   Associated symptoms: no abdominal pain and no fever   Risk factors: recurrent urinary tract infections     Past Medical History  Diagnosis Date  . Diabetes mellitus   . Breast cancer   . Depression   . Migraines   . Gall bladder stones    Past Surgical History  Procedure Laterality Date  . Mastectomy      Bilateral   Family History  Problem Relation Age of Onset  . Cancer Mother   . Heart failure Mother   . Diabetes Father   . Heart failure Father   . Cancer Father   . Stroke Father    History  Substance Use Topics  . Smoking status: Current Every Day Smoker -- 0.50 packs/day for 17 years    Types: Cigarettes  . Smokeless tobacco: Never Used  . Alcohol Use: No   OB History   Grav Para Term Preterm Abortions TAB SAB Ect Mult Living   2 1 1  1  1   1      Review of Systems  Constitutional: Negative for fever and activity change.       All ROS Neg except as noted in HPI  HENT: Negative for nosebleeds.   Eyes: Negative for photophobia and discharge.  Respiratory: Negative for cough, shortness of breath and wheezing.   Cardiovascular: Negative for chest pain and palpitations.  Gastrointestinal: Positive for nausea and vomiting. Negative for abdominal pain  and blood in stool.  Genitourinary: Positive for dysuria. Negative for frequency and hematuria.  Musculoskeletal: Negative for arthralgias, back pain and neck pain.  Skin: Negative.   Neurological: Positive for headaches. Negative for dizziness, seizures and speech difficulty.  Psychiatric/Behavioral: Negative for hallucinations and confusion.       Dekpression.    Allergies  Nitrofurantoin; Lipitor; and Simvastatin  Home Medications   Current Outpatient Rx  Name  Route  Sig  Dispense  Refill  . amitriptyline (ELAVIL) 50 MG tablet   Oral   Take 1 tablet (50 mg total) by mouth 3 (three) times daily.   90 tablet   3   . citalopram (CELEXA) 40 MG tablet   Oral   Take 40 mg by mouth daily.         Marland Kitchen ibuprofen (ADVIL,MOTRIN) 200 MG tablet   Oral   Take 600 mg by mouth every 6 (six) hours as needed. For back pain          . metFORMIN (GLUCOPHAGE) 1000 MG tablet   Oral   Take 1,000 mg by mouth 2 (two) times daily.         . Multiple Vitamin (MULTIVITAMIN WITH MINERALS) TABS   Oral   Take 1 tablet by mouth daily.         . pravastatin (PRAVACHOL) 40 MG tablet  Oral   Take 1 tablet (40 mg total) by mouth daily.   90 tablet   3   . promethazine (PHENERGAN) 25 MG tablet   Oral   Take 1 tablet (25 mg total) by mouth every 6 (six) hours as needed for nausea.   30 tablet   6    BP 123/76  Pulse 98  Temp(Src) 98.3 F (36.8 C) (Oral)  Resp 19  SpO2 98% Physical Exam  Nursing note and vitals reviewed. Constitutional: She is oriented to person, place, and time. She appears well-developed and well-nourished.  Non-toxic appearance.  HENT:  Head: Normocephalic.  Right Ear: Tympanic membrane and external ear normal.  Left Ear: Tympanic membrane and external ear normal.  Eyes: EOM and lids are normal. Pupils are equal, round, and reactive to light.  Neck: Normal range of motion. Neck supple. Carotid bruit is not present.  Cardiovascular: Normal rate, regular  rhythm, normal heart sounds, intact distal pulses and normal pulses.   Pulmonary/Chest: Breath sounds normal. No respiratory distress.  Abdominal: Soft. Bowel sounds are normal. There is no tenderness. There is no guarding.  Musculoskeletal: Normal range of motion.  Lower back pain to palpation and attempted ROM.  Lymphadenopathy:       Head (right side): No submandibular adenopathy present.       Head (left side): No submandibular adenopathy present.    She has no cervical adenopathy.  Neurological: She is alert and oriented to person, place, and time. She has normal strength. No cranial nerve deficit or sensory deficit.  Skin: Skin is warm and dry.  Psychiatric: She has a normal mood and affect. Her speech is normal.    ED Course  Procedures (including critical care time) Labs Review Labs Reviewed  URINALYSIS, ROUTINE W REFLEX MICROSCOPIC - Abnormal; Notable for the following:    Nitrite POSITIVE (*)    All other components within normal limits  GLUCOSE, CAPILLARY - Abnormal; Notable for the following:    Glucose-Capillary 120 (*)    All other components within normal limits  URINE MICROSCOPIC-ADD ON - Abnormal; Notable for the following:    Squamous Epithelial / LPF FEW (*)    Bacteria, UA MANY (*)    All other components within normal limits   Imaging Review No results found.  EKG Interpretation   None       MDM  No diagnosis found. *I have reviewed nursing notes, vital signs, and all appropriate lab and imaging results for this patient.**  Urine reveals positive nitrates and many bacteria. Will treat with keflex and use norco for pain. Urine culture sent to the lab. Pt to have urine rechecked in 7 kto 10 days.  Kathie Dike, PA-C 09/22/13 [phone]

## 2013-11-06 ENCOUNTER — Telehealth: Payer: Self-pay | Admitting: *Deleted

## 2013-11-06 DIAGNOSIS — E119 Type 2 diabetes mellitus without complications: Secondary | ICD-10-CM

## 2013-11-06 DIAGNOSIS — E7800 Pure hypercholesterolemia, unspecified: Secondary | ICD-10-CM

## 2013-11-06 NOTE — Telephone Encounter (Signed)
Left message for patient to return call. Please tell her it has been over 1 year since she has had her cholesterol checked and her Dr would like for her to come in for a lab visit. We will also check her A1C at that visit. Please schedule her for a fasting lab appointment.Anton Cheramie, Rodena Medin

## 2013-12-02 NOTE — Telephone Encounter (Signed)
Patient never returned call or has an upcoming appointment scheduled.Makayla Owens  

## 2014-01-13 ENCOUNTER — Other Ambulatory Visit: Payer: Self-pay | Admitting: Family Medicine

## 2014-01-13 DIAGNOSIS — F329 Major depressive disorder, single episode, unspecified: Secondary | ICD-10-CM

## 2014-01-13 DIAGNOSIS — F3289 Other specified depressive episodes: Secondary | ICD-10-CM

## 2014-01-13 MED ORDER — CITALOPRAM HYDROBROMIDE 40 MG PO TABS: 40.0000 mg | ORAL_TABLET | Freq: Every day | ORAL | Status: DC

## 2014-01-13 MED ORDER — AMITRIPTYLINE HCL 50 MG PO TABS: 50.0000 mg | ORAL_TABLET | Freq: Three times a day (TID) | ORAL | Status: DC

## 2014-03-09 ENCOUNTER — Emergency Department (HOSPITAL_COMMUNITY)
Admission: EM | Admit: 2014-03-09 | Discharge: 2014-03-09 | Disposition: A | Discharge status: Home / Self Care | Payer: Self-pay | Attending: Emergency Medicine | Admitting: Emergency Medicine

## 2014-03-09 ENCOUNTER — Encounter (HOSPITAL_COMMUNITY): Payer: Self-pay | Admitting: Emergency Medicine

## 2014-03-09 DIAGNOSIS — Z8719 Personal history of other diseases of the digestive system: Secondary | ICD-10-CM | POA: Insufficient documentation

## 2014-03-09 DIAGNOSIS — Z853 Personal history of malignant neoplasm of breast: Secondary | ICD-10-CM | POA: Insufficient documentation

## 2014-03-09 DIAGNOSIS — E119 Type 2 diabetes mellitus without complications: Secondary | ICD-10-CM | POA: Insufficient documentation

## 2014-03-09 DIAGNOSIS — F329 Major depressive disorder, single episode, unspecified: Secondary | ICD-10-CM | POA: Insufficient documentation

## 2014-03-09 DIAGNOSIS — F3289 Other specified depressive episodes: Secondary | ICD-10-CM | POA: Insufficient documentation

## 2014-03-09 DIAGNOSIS — Z8679 Personal history of other diseases of the circulatory system: Secondary | ICD-10-CM | POA: Insufficient documentation

## 2014-03-09 DIAGNOSIS — N12 Tubulo-interstitial nephritis, not specified as acute or chronic: Secondary | ICD-10-CM | POA: Insufficient documentation

## 2014-03-09 DIAGNOSIS — N390 Urinary tract infection, site not specified: Secondary | ICD-10-CM | POA: Insufficient documentation

## 2014-03-09 DIAGNOSIS — F17200 Nicotine dependence, unspecified, uncomplicated: Secondary | ICD-10-CM | POA: Insufficient documentation

## 2014-03-09 DIAGNOSIS — Z79899 Other long term (current) drug therapy: Secondary | ICD-10-CM | POA: Insufficient documentation

## 2014-03-09 LAB — URINALYSIS, ROUTINE W REFLEX MICROSCOPIC
Bilirubin Urine: NEGATIVE
Glucose, UA: NEGATIVE mg/dL
KETONES UR: NEGATIVE mg/dL
NITRITE: NEGATIVE
PROTEIN: NEGATIVE mg/dL
Specific Gravity, Urine: <1.005 — ABNORMAL LOW (ref 1.005–1.030)
UROBILINOGEN UA: 0.2 mg/dL (ref 0.0–1.0)
pH: 6 (ref 5.0–8.0)

## 2014-03-09 LAB — URINE MICROSCOPIC-ADD ON

## 2014-03-09 MED ORDER — LIDOCAINE HCL (PF) 1 % IJ SOLN
INTRAMUSCULAR | Status: DC
Start: 2014-03-09 — End: 2014-03-09
  Filled 2014-03-09: qty 5

## 2014-03-09 MED ORDER — CEFTRIAXONE SODIUM 1 G IJ SOLR
1.0000 g | Freq: Once | INTRAMUSCULAR | Status: AC
  Administered 2014-03-09: 1 g via INTRAMUSCULAR

## 2014-03-09 MED ORDER — PHENAZOPYRIDINE HCL 200 MG PO TABS: 200.0000 mg | ORAL_TABLET | Freq: Three times a day (TID) | ORAL | Status: DC

## 2014-03-09 MED ORDER — CEFTRIAXONE SODIUM 1 G IJ SOLR
INTRAMUSCULAR | Status: AC
  Administered 2014-03-09: 15:00:00 1 g via INTRAMUSCULAR
  Filled 2014-03-09: qty 10

## 2014-03-09 MED ORDER — SULFAMETHOXAZOLE-TRIMETHOPRIM 800-160 MG PO TABS: 1.0000 | ORAL_TABLET | Freq: Two times a day (BID) | ORAL | Status: AC

## 2014-03-09 NOTE — Discharge Instructions (Signed)
Be sure you are drinking plenty of water, take the medication as directed and follow up with your doctor. Return here for worsening symptoms.

## 2014-03-09 NOTE — ED Notes (Signed)
GU sx with burning, frequency, pelvic pressure and back pain starting yesterday.  Reports has frequent UTIs and feels the same.  Denies n/v.

## 2014-03-09 NOTE — ED Provider Notes (Signed)
CSN: [id number]     Arrival date & time 03/09/14  1252 History   First MD Initiated Contact with Patient 03/09/14 1311     Chief Complaint  Patient presents with  . Urinary Tract Infection     (Consider location/radiation/quality/duration/timing/severity/associated sxs/prior Treatment) Patient is a 53 y.o. female presenting with urinary tract infection. The history is provided by the patient.  Urinary Tract Infection This is a new problem. The current episode started yesterday. The problem occurs constantly. The problem has been gradually worsening. Associated symptoms include abdominal pain and urinary symptoms. Associated symptoms comments: Lower abdominal pain, midline. Nothing aggravates the symptoms. Treatments tried: AZO. The treatment provided no relief.  Makayla Owens is a 53 y.o. Female presenting w/urinary symptoms of dysuria, burning w/urination, increased frequency, and lower abdominal pain x 1 day.  Pain is constant aching and feeling of pressure in perineum with urination.  Hx of frequent UTIs.  Yesterday had episode of abdominal cramping and diarrhea x 1 which relieved that cramping.  Denies fever, chills, n/v and further cramping pain or diarrhea.  Denies rash.    Past Medical History  Diagnosis Date  . Diabetes mellitus   . Breast cancer   . Depression   . Migraines   . Gall bladder stones    Past Surgical History  Procedure Laterality Date  . Mastectomy      Bilateral   Family History  Problem Relation Age of Onset  . Cancer Mother   . Heart failure Mother   . Diabetes Father   . Heart failure Father   . Cancer Father   . Stroke Father    History  Substance Use Topics  . Smoking status: Current Every Day Smoker -- 0.50 packs/day for 17 years    Types: Cigarettes  . Smokeless tobacco: Never Used  . Alcohol Use: No   OB History   Grav Para Term Preterm Abortions TAB SAB Ect Mult Living   2 1 1  1  1   1      Review of Systems  Constitutional: Negative.     HENT: Negative.   Eyes: Negative.   Respiratory: Negative.   Cardiovascular: Negative.   Gastrointestinal: Positive for abdominal pain.  Endocrine: Negative.   Genitourinary: Positive for dysuria, frequency and pelvic pain.  Musculoskeletal: Negative.   Skin: Negative.   Allergic/Immunologic: Negative.   Neurological: Negative.   Hematological: Negative for adenopathy.  Psychiatric/Behavioral: Negative.       Allergies  Nitrofurantoin; Lipitor; Other; and Simvastatin  Home Medications   Current Outpatient Rx  Name  Route  Sig  Dispense  Refill  . amitriptyline (ELAVIL) 50 MG tablet   Oral   Take 100 mg by mouth at bedtime.         . citalopram (CELEXA) 40 MG tablet   Oral   Take 1 tablet (40 mg total) by mouth daily.   90 tablet   3   . ibuprofen (ADVIL,MOTRIN) 200 MG tablet   Oral   Take 600 mg by mouth every 6 (six) hours as needed. For back pain          . metFORMIN (GLUCOPHAGE) 1000 MG tablet   Oral   Take 1,000 mg by mouth 2 (two) times daily.          BP 119/72  Pulse 102  Temp(Src) 97.8 F (36.6 C) (Oral)  Resp 18  Ht 5\' 6"  (1.676 m)  Wt 175 lb (79.379 kg)  BMI 28.26 kg/m2  SpO2  92% Physical Exam  Constitutional: She is oriented to person, place, and time. She appears well-developed and well-nourished.  HENT:  Head: Normocephalic and atraumatic.  Eyes: Pupils are equal, round, and reactive to light.  Neck: Normal range of motion. Neck supple.  Cardiovascular: Normal rate, regular rhythm and normal heart sounds.   Pulmonary/Chest: Effort normal and breath sounds normal.  Abdominal: Soft. There is tenderness. There is CVA tenderness.    Musculoskeletal: Normal range of motion.  Neurological: She is alert and oriented to person, place, and time.  Skin: Skin is warm and dry.  Psychiatric: She has a normal mood and affect. Her behavior is normal.   Results for orders placed during the hospital encounter of 03/09/14 (from the past 24  hour(s))  URINALYSIS, ROUTINE W REFLEX MICROSCOPIC     Status: Abnormal   Collection Time    03/09/14  1:14 PM      Result Value Ref Range   Color, Urine YELLOW  YELLOW   APPearance CLEAR  CLEAR   Specific Gravity, Urine <1.005 (*) 1.005 - 1.030   pH 6.0  5.0 - 8.0   Glucose, UA NEGATIVE  NEGATIVE mg/dL   Hgb urine dipstick LARGE (*) NEGATIVE   Bilirubin Urine NEGATIVE  NEGATIVE   Ketones, ur NEGATIVE  NEGATIVE mg/dL   Protein, ur NEGATIVE  NEGATIVE mg/dL   Urobilinogen, UA 0.2  0.0 - 1.0 mg/dL   Nitrite NEGATIVE  NEGATIVE   Leukocytes, UA LARGE (*) NEGATIVE  URINE MICROSCOPIC-ADD ON     Status: Abnormal   Collection Time    03/09/14  1:14 PM      Result Value Ref Range   Squamous Epithelial / LPF RARE  RARE   WBC, UA TOO NUMEROUS TO COUNT  <3 WBC/hpf   RBC / HPF TOO NUMEROUS TO COUNT  <3 RBC/hpf   Bacteria, UA MANY (*) RARE    ED Course  Procedures  MDM  53 y.o. female with UTI and probably early Pyelo. Stable for discharge without fever, nausea or vomiting. Will give Rocephin 1 gram IM now and then Rx to follow. Discussed with the patient and all questioned fully answered. She will return for worsening symptoms.    Medication List    TAKE these medications       phenazopyridine 200 MG tablet  Commonly known as:  PYRIDIUM  Take 1 tablet (200 mg total) by mouth 3 (three) times daily.     sulfamethoxazole-trimethoprim 800-160 MG per tablet  Commonly known as:  BACTRIM DS,SEPTRA DS  Take 1 tablet by mouth 2 (two) times daily.      ASK your doctor about these medications       amitriptyline 50 MG tablet  Commonly known as:  ELAVIL  Take 100 mg by mouth at bedtime.     citalopram 40 MG tablet  Commonly known as:  CELEXA  Take 1 tablet (40 mg total) by mouth daily.     ibuprofen 200 MG tablet  Commonly known as:  ADVIL,MOTRIN  Take 600 mg by mouth every 6 (six) hours as needed. For back pain     metFORMIN 1000 MG tablet  Commonly known as:  GLUCOPHAGE  Take  1,000 mg by mouth 2 (two) times daily.           [facility] Bunnie Pion, [place] 03/09/14 1505

## 2014-03-09 NOTE — ED Provider Notes (Signed)
Medical screening examination/treatment/procedure(s) were performed by non-physician practitioner and as supervising physician I was immediately available for consultation/collaboration.   EKG Interpretation None     ] Rolland Porter, MD, Abram Sander   Janice Norrie, MD 03/09/14 [phone]

## 2014-03-09 NOTE — Care Management Note (Signed)
ED/CM noted patient did not have health insurance and/or PCP listed in the computer.  Patient was given the [facility] with information on the clinics, food pantries, and the handout for new health insurance sign-up.  Patient expressed appreciation for information received. Pt was also given a discount drug card.

## 2014-03-16 ENCOUNTER — Emergency Department (HOSPITAL_COMMUNITY)
Admission: EM | Admit: 2014-03-16 | Discharge: 2014-03-16 | Disposition: A | Discharge status: Home / Self Care | Payer: Self-pay | Attending: Emergency Medicine | Admitting: Emergency Medicine

## 2014-03-16 ENCOUNTER — Encounter (HOSPITAL_COMMUNITY): Payer: Self-pay | Admitting: Emergency Medicine

## 2014-03-16 DIAGNOSIS — R109 Unspecified abdominal pain: Secondary | ICD-10-CM | POA: Insufficient documentation

## 2014-03-16 DIAGNOSIS — Z79899 Other long term (current) drug therapy: Secondary | ICD-10-CM | POA: Insufficient documentation

## 2014-03-16 DIAGNOSIS — R300 Dysuria: Secondary | ICD-10-CM | POA: Insufficient documentation

## 2014-03-16 DIAGNOSIS — R3915 Urgency of urination: Secondary | ICD-10-CM | POA: Insufficient documentation

## 2014-03-16 DIAGNOSIS — F17200 Nicotine dependence, unspecified, uncomplicated: Secondary | ICD-10-CM | POA: Insufficient documentation

## 2014-03-16 DIAGNOSIS — E119 Type 2 diabetes mellitus without complications: Secondary | ICD-10-CM | POA: Insufficient documentation

## 2014-03-16 DIAGNOSIS — F3289 Other specified depressive episodes: Secondary | ICD-10-CM | POA: Insufficient documentation

## 2014-03-16 DIAGNOSIS — Z853 Personal history of malignant neoplasm of breast: Secondary | ICD-10-CM | POA: Insufficient documentation

## 2014-03-16 DIAGNOSIS — F329 Major depressive disorder, single episode, unspecified: Secondary | ICD-10-CM | POA: Insufficient documentation

## 2014-03-16 DIAGNOSIS — Z9010 Acquired absence of unspecified breast and nipple: Secondary | ICD-10-CM | POA: Insufficient documentation

## 2014-03-16 DIAGNOSIS — R350 Frequency of micturition: Secondary | ICD-10-CM | POA: Insufficient documentation

## 2014-03-16 DIAGNOSIS — Z8719 Personal history of other diseases of the digestive system: Secondary | ICD-10-CM | POA: Insufficient documentation

## 2014-03-16 DIAGNOSIS — R739 Hyperglycemia, unspecified: Secondary | ICD-10-CM

## 2014-03-16 LAB — URINALYSIS, ROUTINE W REFLEX MICROSCOPIC
Bilirubin Urine: NEGATIVE
GLUCOSE, UA: 500 mg/dL — AB
Hgb urine dipstick: NEGATIVE
Ketones, ur: NEGATIVE mg/dL
Nitrite: POSITIVE — AB
PROTEIN: NEGATIVE mg/dL
SPECIFIC GRAVITY, URINE: 1.02 (ref 1.005–1.030)
Urobilinogen, UA: 1 mg/dL (ref 0.0–1.0)
pH: 5.5 (ref 5.0–8.0)

## 2014-03-16 LAB — URINE MICROSCOPIC-ADD ON

## 2014-03-16 LAB — CBG MONITORING, ED: GLUCOSE-CAPILLARY: 220 mg/dL — AB (ref 70–99)

## 2014-03-16 MED ORDER — SULFAMETHOXAZOLE-TRIMETHOPRIM 800-160 MG PO TABS: 1.0000 | ORAL_TABLET | Freq: Two times a day (BID) | ORAL | Status: DC

## 2014-03-16 NOTE — Discharge Instructions (Signed)
Dysuria Dysuria is the medical term for pain with urination. There are many causes for dysuria, but urinary tract infection is the most common. If a urinalysis was performed it can show that there is a urinary tract infection. A urine culture confirms that you or your child is sick. You will need to follow up with a healthcare provider because:  If a urine culture was done you will need to know the culture results and treatment recommendations.  If the urine culture was positive, you or your child will need to be put on antibiotics or know if the antibiotics prescribed are the right antibiotics for your urinary tract infection.  If the urine culture is negative (no urinary tract infection), then other causes may need to be explored or antibiotics need to be stopped. Today laboratory work may have been done and there does not seem to be an infection. If cultures were done they will take at least 24 to 48 hours to be completed. Today x-rays may have been taken and they read as normal. No cause can be found for the problems. The x-rays may be re-read by a radiologist and you will be contacted if additional findings are made. You or your child may have been put on medications to help with this problem until you can see your primary caregiver. If the problems get better, see your primary caregiver if the problems return. If you were given antibiotics (medications which kill germs), take all of the mediations as directed for the full course of treatment.  If laboratory work was done, you need to find the results. Leave a telephone number where you can be reached. If this is not possible, make sure you find out how you are to get test results. HOME CARE INSTRUCTIONS   Drink lots of fluids. For adults, drink eight, 8 ounce glasses of clear juice or water a day. For children, replace fluids as suggested by your caregiver.  Empty the bladder often. Avoid holding urine for long periods of time.  After a bowel  movement, women should cleanse front to back, using each tissue only once.  Empty your bladder before and after sexual intercourse.  Take all the medicine given to you until it is gone. You may feel better in a few days, but TAKE ALL MEDICINE.  Avoid caffeine, tea, alcohol and carbonated beverages, because they tend to irritate the bladder.  In men, alcohol may irritate the prostate.  Only take over-the-counter or prescription medicines for pain, discomfort, or fever as directed by your caregiver.  If your caregiver has given you a follow-up appointment, it is very important to keep that appointment. Not keeping the appointment could result in a chronic or permanent injury, pain, and disability. If there is any problem keeping the appointment, you must call back to this facility for assistance. SEEK IMMEDIATE MEDICAL CARE IF:   Back pain develops.  A fever develops.  There is nausea (feeling sick to your stomach) or vomiting (throwing up).  Problems are no better with medications or are getting worse. MAKE SURE YOU:   Understand these instructions.  Will watch your condition.  Will get help right away if you are not doing well or get worse. Document Released: 08/17/2004 Document Revised: 02/11/2012 Document Reviewed: 06/24/2008 [facility] Patient Information 2014 [place].  Hyperglycemia Hyperglycemia occurs when the glucose (sugar) in your blood is too high. Hyperglycemia can happen for many reasons, but it most often happens to people who do not know they have  diabetes or are not managing their diabetes properly.  CAUSES  Whether you have diabetes or not, there are other causes of hyperglycemia. Hyperglycemia can occur when you have diabetes, but it can also occur in other situations that you might not be as aware of, such as: Diabetes  If you have diabetes and are having problems controlling your blood glucose, hyperglycemia could occur because of some of the following  reasons:  Not following your meal plan.  Not taking your diabetes medications or not taking it properly.  Exercising less or doing less activity than you normally do.  Being sick. Pre-diabetes  This cannot be ignored. Before people develop Type 2 diabetes, they almost always have "pre-diabetes." This is when your blood glucose levels are higher than normal, but not yet high enough to be diagnosed as diabetes. Research has shown that some long-term damage to the body, especially the heart and circulatory system, may already be occurring during pre-diabetes. If you take action to manage your blood glucose when you have pre-diabetes, you may delay or prevent Type 2 diabetes from developing. Stress  If you have diabetes, you may be "diet" controlled or on oral medications or insulin to control your diabetes. However, you may find that your blood glucose is higher than usual in the hospital whether you have diabetes or not. This is often referred to as "stress hyperglycemia." Stress can elevate your blood glucose. This happens because of hormones put out by the body during times of stress. If stress has been the cause of your high blood glucose, it can be followed regularly by your caregiver. That way he/she can make sure your hyperglycemia does not continue to get worse or progress to diabetes. Steroids  Steroids are medications that act on the infection fighting system (immune system) to block inflammation or infection. One side effect can be a rise in blood glucose. Most people can produce enough extra insulin to allow for this rise, but for those who cannot, steroids make blood glucose levels go even higher. It is not unusual for steroid treatments to "uncover" diabetes that is developing. It is not always possible to determine if the hyperglycemia will go away after the steroids are stopped. A special blood test called an A1c is sometimes done to determine if your blood glucose was elevated before  the steroids were started. SYMPTOMS  Thirsty.  Frequent urination.  Dry mouth.  Blurred vision.  Tired or fatigue.  Weakness.  Sleepy.  Tingling in feet or leg. DIAGNOSIS  Diagnosis is made by monitoring blood glucose in one or all of the following ways:  A1c test. This is a chemical found in your blood.  Fingerstick blood glucose monitoring.  Laboratory results. TREATMENT  First, knowing the cause of the hyperglycemia is important before the hyperglycemia can be treated. Treatment may include, but is not be limited to:  Education.  Change or adjustment in medications.  Change or adjustment in meal plan.  Treatment for an illness, infection, etc.  More frequent blood glucose monitoring.  Change in exercise plan.  Decreasing or stopping steroids.  Lifestyle changes. HOME CARE INSTRUCTIONS   Test your blood glucose as directed.  Exercise regularly. Your caregiver will give you instructions about exercise. Pre-diabetes or diabetes which comes on with stress is helped by exercising.  Eat wholesome, balanced meals. Eat often and at regular, fixed times. Your caregiver or nutritionist will give you a meal plan to guide your sugar intake.  Being at an ideal weight is  important. If needed, losing as little as 10 to 15 pounds may help improve blood glucose levels. SEEK MEDICAL CARE IF:   You have questions about medicine, activity, or diet.  You continue to have symptoms (problems such as increased thirst, urination, or weight gain). SEEK IMMEDIATE MEDICAL CARE IF:   You are vomiting or have diarrhea.  Your breath smells fruity.  You are breathing faster or slower.  You are very sleepy or incoherent.  You have numbness, tingling, or pain in your feet or hands.  You have chest pain.  Your symptoms get worse even though you have been following your caregiver's orders.  If you have any other questions or concerns. Document Released: 05/15/2001 Document  Revised: 02/11/2012 Document Reviewed: 03/17/2012 [facility] Patient Information 2014 [place], [place].

## 2014-03-16 NOTE — ED Notes (Signed)
Alert, lying on stretcher, reading, Nad at present.  J Idol in to see pt

## 2014-03-16 NOTE — ED Notes (Signed)
Patient c/o pain with urination and frequency. Per patient was seen here one week ago and diagnosed with UTI. Per patient given Bactrim in which she took all of the medication. Denies any improvement. Denies any abd pain, nausea, or vomiting.

## 2014-03-18 LAB — URINE CULTURE: Colony Count: 50000

## 2014-03-19 NOTE — ED Provider Notes (Signed)
CSN: [id number]     Arrival date & time 03/16/14  1617 History   First MD Initiated Contact with Patient 03/16/14 1725     Chief Complaint  Patient presents with  . Urinary Tract Infection     (Consider location/radiation/quality/duration/timing/severity/associated sxs/prior Treatment) HPI Comments: Makayla Owens is a 53 y.o. Female with a history of diabetes presenting with continued dysuria symptoms.  She describes burning urethral pain with increased frequency of urination and suprapubic pressure sensation.  She was treated here last week for a uti with bactrim which she took a full 7 day course of, with improvement but no complete alleviation of symptoms.  She had low back pain at her last visit which has resolved but continues with the symptoms described above.  She denies nausea, vomiting, abdominal pain and fevers and additionally denies vaginal discharge or irritation.  She is not sexually active.       The history is provided by the patient.    Past Medical History  Diagnosis Date  . Diabetes mellitus   . Breast cancer   . Depression   . Migraines   . Gall bladder stones    Past Surgical History  Procedure Laterality Date  . Mastectomy      Bilateral   Family History  Problem Relation Age of Onset  . Cancer Mother   . Heart failure Mother   . Diabetes Father   . Heart failure Father   . Cancer Father   . Stroke Father    History  Substance Use Topics  . Smoking status: Current Every Day Smoker -- 0.50 packs/day for 17 years    Types: Cigarettes  . Smokeless tobacco: Never Used  . Alcohol Use: No   OB History   Grav Para Term Preterm Abortions TAB SAB Ect Mult Living   2 1 1  1  1   1      Review of Systems  Constitutional: Negative for fever and chills.  HENT: Negative for congestion and sore throat.   Eyes: Negative.   Respiratory: Negative for chest tightness and shortness of breath.   Cardiovascular: Negative for chest pain.  Gastrointestinal:  Negative for nausea and abdominal pain.  Genitourinary: Positive for dysuria, urgency and frequency. Negative for hematuria, flank pain, vaginal bleeding and vaginal discharge.  Musculoskeletal: Negative for arthralgias, back pain and myalgias.  Skin: Negative.  Negative for rash and wound.  Neurological: Negative for dizziness, weakness, light-headedness, numbness and headaches.  Psychiatric/Behavioral: Negative.       Allergies  Nitrofurantoin; Lipitor; Other; and Simvastatin  Home Medications   Prior to Admission medications   Medication Sig Start Date End Date Taking? Authorizing Provider  amitriptyline (ELAVIL) 50 MG tablet Take 100 mg by mouth at bedtime.    Historical Provider, MD  citalopram (CELEXA) 40 MG tablet Take 1 tablet (40 mg total) by mouth daily. 01/13/14   Zigmund Gottron, MD  ibuprofen (ADVIL,MOTRIN) 200 MG tablet Take 600 mg by mouth every 6 (six) hours as needed. For back pain     Historical Provider, MD  metFORMIN (GLUCOPHAGE) 1000 MG tablet Take 1,000 mg by mouth 2 (two) times daily.    Historical Provider, MD  phenazopyridine (PYRIDIUM) 200 MG tablet Take 1 tablet (200 mg total) by mouth 3 (three) times daily. 03/09/14   Hope Bunnie Pion, NP  sulfamethoxazole-trimethoprim (SEPTRA DS) 800-160 MG per tablet Take 1 tablet by mouth every 12 (twelve) hours. 03/16/14   Evalee Jefferson, PA-C   BP [phone]  Pulse 90  Temp(Src) 97.5 F (36.4 C) (Oral)  Resp 18  Ht 5\' 6"  (1.676 m)  Wt 170 lb (77.111 kg)  BMI 27.45 kg/m2  SpO2 95% Physical Exam  Nursing note and vitals reviewed. Constitutional: She appears well-developed and well-nourished.  HENT:  Head: Normocephalic and atraumatic.  Eyes: Conjunctivae are normal.  Cardiovascular: Normal rate, regular rhythm, normal heart sounds and intact distal pulses.   Pulmonary/Chest: Effort normal and breath sounds normal. She has no wheezes.  Abdominal: Soft. Bowel sounds are normal. There is tenderness.  Mild ttp suprapubic,   No distention.  No guarding.  Musculoskeletal: Normal range of motion.  Neurological: She is alert.  Skin: Skin is warm and dry.  Psychiatric: She has a normal mood and affect.    ED Course  Procedures (including critical care time) Labs Review Labs Reviewed  URINALYSIS, ROUTINE W REFLEX MICROSCOPIC - Abnormal; Notable for the following:    Glucose, UA 500 (*)    Nitrite POSITIVE (*)    Leukocytes, UA TRACE (*)    All other components within normal limits  URINE MICROSCOPIC-ADD ON - Abnormal; Notable for the following:    Bacteria, UA FEW (*)    All other components within normal limits  CBG MONITORING, ED - Abnormal; Notable for the following:    Glucose-Capillary 220 (*)    All other components within normal limits  URINE CULTURE    Imaging Review No results found.   EKG Interpretation None      MDM   Final diagnoses:  Dysuria  Hyperglycemia    Review of prior labs and todays labs completed with much improved but not normal UA,  Still with nitrites, few bacteria.  No urine cx obtained at last visit - this was sent today.  Suspect pt needs longer course of abx,  Given second course of bactrim.  Encouraged increased fluid intake, f/u with pcp if sx not completely resolved after this second course.  No flank pain, no hematuria, pt does not have hx of kidney stones,  No indication of pyelo today.  The patient appears reasonably screened and/or stabilized for discharge and I doubt any other medical condition or other [facility] requiring further screening, evaluation, or treatment in the ED at this time prior to discharge.     Evalee Jefferson, PA-C 03/19/14 1422

## 2014-03-21 NOTE — ED Provider Notes (Signed)
Medical screening examination/treatment/procedure(s) were performed by non-physician practitioner and as supervising physician I was immediately available for consultation/collaboration.     Babette Relic, MD 03/21/14 [phone]

## 2014-04-06 ENCOUNTER — Telehealth: Payer: Self-pay | Admitting: Family Medicine

## 2014-04-06 NOTE — Telephone Encounter (Signed)
Mother says her daughter has had UTI at least once a month since she was put on Elavil. She wonders if there is a connection between the two.  She says her daughter doesn't have insuance so she cant come to the dr here Please advise

## 2014-04-07 NOTE — Telephone Encounter (Signed)
Spoke with Makayla Owens and explained that Dr Andria Frames is not in town.She totally agrees and would like Dr Andria Frames call her or advise.Thank you.[place]

## 2014-04-07 NOTE — Telephone Encounter (Signed)
Dear Dema Severin Team Dr. Andria Frames is not in town---I do not know who her daughter is. If her daughter is a patient here, get her name and I will look up chart. If she is not a patienthere, I would recommend she wait until Dr Lemmie Evens gets back in town to address this (He will be back [place]) THANKS! [place]

## 2014-04-12 NOTE — Telephone Encounter (Signed)
Called.  Patient stopped elavil on own, feels better and is not having UTIs.  Told to stay off.

## 2014-04-12 NOTE — Addendum Note (Signed)
Addended by: Zenia Resides on: 04/12/2014 11:58 AM   Modules accepted: Orders, Medications

## 2014-04-26 ENCOUNTER — Other Ambulatory Visit: Payer: Self-pay | Admitting: Family Medicine

## 2014-05-17 ENCOUNTER — Telehealth: Payer: Self-pay | Admitting: Family Medicine

## 2014-05-26 NOTE — Telephone Encounter (Signed)
No Answer/Busy - Called patient to come in for lab work. Please schedule for cholesterol and A1C test for her diabetes care.    By [place]

## 2014-07-03 ENCOUNTER — Other Ambulatory Visit: Payer: Self-pay | Admitting: Family Medicine

## 2014-07-30 ENCOUNTER — Ambulatory Visit (INDEPENDENT_AMBULATORY_CARE_PROVIDER_SITE_OTHER): Payer: Self-pay | Admitting: Family Medicine

## 2014-07-30 ENCOUNTER — Encounter: Payer: Self-pay | Admitting: Family Medicine

## 2014-07-30 VITALS — BP 131/73 | HR 80 | Temp 98.6°F | Ht 66.0 in | Wt 167.0 lb

## 2014-07-30 DIAGNOSIS — Z23 Encounter for immunization: Secondary | ICD-10-CM

## 2014-07-30 DIAGNOSIS — R52 Pain, unspecified: Secondary | ICD-10-CM

## 2014-07-30 DIAGNOSIS — E7800 Pure hypercholesterolemia, unspecified: Secondary | ICD-10-CM

## 2014-07-30 DIAGNOSIS — E119 Type 2 diabetes mellitus without complications: Secondary | ICD-10-CM

## 2014-07-30 DIAGNOSIS — G5601 Carpal tunnel syndrome, right upper limb: Secondary | ICD-10-CM

## 2014-07-30 DIAGNOSIS — F17200 Nicotine dependence, unspecified, uncomplicated: Secondary | ICD-10-CM

## 2014-07-30 DIAGNOSIS — G5600 Carpal tunnel syndrome, unspecified upper limb: Secondary | ICD-10-CM

## 2014-07-30 LAB — LIPID PANEL
Cholesterol: 236 mg/dL — ABNORMAL HIGH (ref 0–200)
HDL: 48 mg/dL (ref >39)
LDL CALC: 157 mg/dL — AB (ref 0–99)
Total CHOL/HDL Ratio: 4.9 Ratio
Triglycerides: 153 mg/dL — ABNORMAL HIGH (ref <150)
VLDL: 31 mg/dL (ref 0–40)

## 2014-07-30 LAB — URIC ACID: URIC ACID, SERUM: 4.4 mg/dL (ref 2.4–7.0)

## 2014-07-30 LAB — POCT GLYCOSYLATED HEMOGLOBIN (HGB A1C): HEMOGLOBIN A1C: 6.7

## 2014-07-30 LAB — BASIC METABOLIC PANEL
BUN: 8 mg/dL (ref 6–23)
CALCIUM: 9.6 mg/dL (ref 8.4–10.5)
CHLORIDE: 103 meq/L (ref 96–112)
CO2: 25 mEq/L (ref 19–32)
CREATININE: 0.64 mg/dL (ref 0.50–1.10)
Glucose, Bld: 89 mg/dL (ref 70–99)
Potassium: 4.2 mEq/L (ref 3.5–5.3)
Sodium: 139 mEq/L (ref 135–145)

## 2014-07-30 LAB — RHEUMATOID FACTOR: Rhuematoid fact SerPl-aCnc: <10 IU/mL (ref <=14)

## 2014-07-30 LAB — POCT SEDIMENTATION RATE: POCT SED RATE: 5 mm/h (ref 0–22)

## 2014-07-30 NOTE — Progress Notes (Signed)
   Subjective:    Patient ID: ESHAL PROPPS, female    DOB: 1961-03-26, 53 y.o.   MRN: [id number]  HPI Has not been seen in two years, primarily due to finances.  She has been deemed cured from her breast cancer. 1. Behind on HPDP 2. Multiple joint aches including both ankles, both knees and right wrist.  Wrist is a bit different in that it has numbness at times in the median nerve distribution.  3. Has been borderline diabetic.  Hgb AIC today is good.    Review of Systems     Objective:   Physical ExamLungs clear VS noted and good Cardiac RRR without m or g Abd benign. Ext no joint swelling, just tenderness.  +provocative testing of carpal tunnel on right. Injected 40mg  medrol and 1 cc xylocaine with good immediate relief.        Assessment & Plan:

## 2014-07-30 NOTE — Assessment & Plan Note (Signed)
Inject and brace

## 2014-07-30 NOTE — Patient Instructions (Signed)
I will call with lab results. Use the wrist brace. Try to get signed up for our orange card program Your cold should get better on its own.  Call me next week if no better Quit smoking You diabetes is under good control

## 2014-07-30 NOTE — Assessment & Plan Note (Addendum)
Diet controled.  Foot exam ok.  Eye screen.  + microalbumin.  Will add ACE for renal protection.

## 2014-07-30 NOTE — Assessment & Plan Note (Addendum)
Seems more arthralgias at this point.  Will check for inflamatory arthropathy.  Given the normal Rheum factor, sed rate and uric acid, I am back to my original diagnosis of fibromyalgia as the cause of her pain.

## 2014-07-30 NOTE — Assessment & Plan Note (Signed)
>>  ASSESSMENT AND PLAN FOR DM (DIABETES MELLITUS), TYPE 2 WITH RENAL COMPLICATIONS (H[facility]) WRITTEN ON 08/02/2014 11:41 AM BY HENSEL, Richrd Prime, MD  Diet controled.  Foot exam ok.  Eye screen.  + microalbumin.  Will add ACE for renal protection.

## 2014-07-30 NOTE — Assessment & Plan Note (Signed)
Counseled to quit 

## 2014-07-30 NOTE — Assessment & Plan Note (Addendum)
Check labs  LDL up and diabetic.  She thinks the statin she has tolerated the best over the years is simvastatin.  Will try low dose and see if she tolerates.

## 2014-07-31 LAB — MICROALBUMIN / CREATININE URINE RATIO
Creatinine, Urine: 15.6 mg/dL
MICROALB/CREAT RATIO: 32.1 mg/g — AB (ref 0.0–30.0)
Microalb, Ur: 0.5 mg/dL (ref 0.00–1.89)

## 2014-08-02 MED ORDER — SIMVASTATIN 10 MG PO TABS: 10.0000 mg | ORAL_TABLET | Freq: Every day | ORAL | Status: DC

## 2014-08-02 MED ORDER — LISINOPRIL 5 MG PO TABS: 5.0000 mg | ORAL_TABLET | Freq: Every day | ORAL | Status: DC

## 2014-08-02 NOTE — Addendum Note (Signed)
Addended by: Zenia Resides on: 08/02/2014 11:42 AM   Modules accepted: Orders

## 2014-08-13 ENCOUNTER — Ambulatory Visit: Payer: Self-pay | Admitting: Family Medicine

## 2014-08-18 ENCOUNTER — Telehealth: Payer: Self-pay | Admitting: Family Medicine

## 2014-08-18 MED ORDER — CEPHALEXIN 500 MG PO CAPS: 500.0000 mg | ORAL_CAPSULE | Freq: Two times a day (BID) | ORAL | Status: DC

## 2014-08-18 NOTE — Telephone Encounter (Signed)
Pt's mother calls, pt has a UTI and cannot afford to come in a be seen. Requesting that an antibiotic by sent to [facility] in [place]. Pls advise.

## 2014-08-28 ENCOUNTER — Emergency Department (HOSPITAL_COMMUNITY)
Admission: EM | Admit: 2014-08-28 | Discharge: 2014-08-28 | Disposition: A | Discharge status: Home / Self Care | Payer: Self-pay | Attending: Emergency Medicine | Admitting: Emergency Medicine

## 2014-08-28 ENCOUNTER — Encounter (HOSPITAL_COMMUNITY): Payer: Self-pay | Admitting: Emergency Medicine

## 2014-08-28 ENCOUNTER — Emergency Department (HOSPITAL_COMMUNITY): Payer: Self-pay

## 2014-08-28 DIAGNOSIS — Z8719 Personal history of other diseases of the digestive system: Secondary | ICD-10-CM | POA: Insufficient documentation

## 2014-08-28 DIAGNOSIS — Z792 Long term (current) use of antibiotics: Secondary | ICD-10-CM | POA: Insufficient documentation

## 2014-08-28 DIAGNOSIS — E119 Type 2 diabetes mellitus without complications: Secondary | ICD-10-CM | POA: Insufficient documentation

## 2014-08-28 DIAGNOSIS — Z79899 Other long term (current) drug therapy: Secondary | ICD-10-CM | POA: Insufficient documentation

## 2014-08-28 DIAGNOSIS — F17200 Nicotine dependence, unspecified, uncomplicated: Secondary | ICD-10-CM | POA: Insufficient documentation

## 2014-08-28 DIAGNOSIS — Z853 Personal history of malignant neoplasm of breast: Secondary | ICD-10-CM | POA: Insufficient documentation

## 2014-08-28 DIAGNOSIS — F329 Major depressive disorder, single episode, unspecified: Secondary | ICD-10-CM | POA: Insufficient documentation

## 2014-08-28 DIAGNOSIS — Z791 Long term (current) use of non-steroidal anti-inflammatories (NSAID): Secondary | ICD-10-CM | POA: Insufficient documentation

## 2014-08-28 DIAGNOSIS — E7800 Pure hypercholesterolemia, unspecified: Secondary | ICD-10-CM | POA: Insufficient documentation

## 2014-08-28 DIAGNOSIS — F3289 Other specified depressive episodes: Secondary | ICD-10-CM | POA: Insufficient documentation

## 2014-08-28 DIAGNOSIS — R112 Nausea with vomiting, unspecified: Secondary | ICD-10-CM | POA: Insufficient documentation

## 2014-08-28 DIAGNOSIS — R1011 Right upper quadrant pain: Secondary | ICD-10-CM | POA: Insufficient documentation

## 2014-08-28 DIAGNOSIS — G43909 Migraine, unspecified, not intractable, without status migrainosus: Secondary | ICD-10-CM | POA: Insufficient documentation

## 2014-08-28 LAB — CBC WITH DIFFERENTIAL/PLATELET
Basophils Absolute: 0 10*3/uL (ref 0.0–0.1)
Basophils Relative: 1 % (ref 0–1)
Eosinophils Absolute: 0.2 10*3/uL (ref 0.0–0.7)
Eosinophils Relative: 3 % (ref 0–5)
HCT: 41.8 % (ref 36.0–46.0)
HEMOGLOBIN: 14.8 g/dL (ref 12.0–15.0)
LYMPHS ABS: 3 10*3/uL (ref 0.7–4.0)
Lymphocytes Relative: 39 % (ref 12–46)
MCH: 31.8 pg (ref 26.0–34.0)
MCHC: 35.4 g/dL (ref 30.0–36.0)
MCV: 89.9 fL (ref 78.0–100.0)
MONOS PCT: 7 % (ref 3–12)
Monocytes Absolute: 0.5 10*3/uL (ref 0.1–1.0)
NEUTROS ABS: 3.9 10*3/uL (ref 1.7–7.7)
NEUTROS PCT: 50 % (ref 43–77)
Platelets: 193 10*3/uL (ref 150–400)
RBC: 4.65 MIL/uL (ref 3.87–5.11)
RDW: 13.4 % (ref 11.5–15.5)
WBC: 7.7 10*3/uL (ref 4.0–10.5)

## 2014-08-28 LAB — URINALYSIS, ROUTINE W REFLEX MICROSCOPIC
Bilirubin Urine: NEGATIVE
Glucose, UA: NEGATIVE mg/dL
HGB URINE DIPSTICK: NEGATIVE
Ketones, ur: NEGATIVE mg/dL
Leukocytes, UA: NEGATIVE
Nitrite: NEGATIVE
PROTEIN: NEGATIVE mg/dL
Specific Gravity, Urine: <1.005 — ABNORMAL LOW (ref 1.005–1.030)
UROBILINOGEN UA: 0.2 mg/dL (ref 0.0–1.0)
pH: 7 (ref 5.0–8.0)

## 2014-08-28 LAB — COMPREHENSIVE METABOLIC PANEL
ALK PHOS: 80 U/L (ref 39–117)
ALT: 15 U/L (ref 0–35)
ANION GAP: 13 (ref 5–15)
AST: 13 U/L (ref 0–37)
Albumin: 3.8 g/dL (ref 3.5–5.2)
BUN: 8 mg/dL (ref 6–23)
CHLORIDE: 103 meq/L (ref 96–112)
CO2: 25 mEq/L (ref 19–32)
Calcium: 9.3 mg/dL (ref 8.4–10.5)
Creatinine, Ser: 0.5 mg/dL (ref 0.50–1.10)
GFR calc non Af Amer: >90 mL/min (ref >90)
GLUCOSE: 112 mg/dL — AB (ref 70–99)
POTASSIUM: 4 meq/L (ref 3.7–5.3)
Sodium: 141 mEq/L (ref 137–147)
Total Bilirubin: 0.2 mg/dL — ABNORMAL LOW (ref 0.3–1.2)
Total Protein: 6.6 g/dL (ref 6.0–8.3)

## 2014-08-28 LAB — LIPASE, BLOOD: Lipase: 72 U/L — ABNORMAL HIGH (ref 11–59)

## 2014-08-28 MED ORDER — ONDANSETRON HCL 4 MG/2ML IJ SOLN
4.0000 mg | Freq: Once | INTRAMUSCULAR | Status: AC
Start: 2014-08-28 — End: 2014-08-28
  Administered 2014-08-28: 4 mg via INTRAVENOUS
  Filled 2014-08-28: qty 2

## 2014-08-28 MED ORDER — ONDANSETRON HCL 4 MG PO TABS: 4.0000 mg | ORAL_TABLET | Freq: Three times a day (TID) | ORAL | Status: DC | PRN

## 2014-08-28 MED ORDER — OMEPRAZOLE 20 MG PO CPDR: 20.0000 mg | DELAYED_RELEASE_CAPSULE | Freq: Two times a day (BID) | ORAL | Status: DC

## 2014-08-28 MED ORDER — MORPHINE SULFATE 4 MG/ML IJ SOLN
4.0000 mg | Freq: Once | INTRAMUSCULAR | Status: AC
  Administered 2014-08-28: 4 mg via INTRAVENOUS
  Filled 2014-08-28: qty 1

## 2014-08-28 MED ORDER — FAMOTIDINE 20 MG PO TABS
20.0000 mg | ORAL_TABLET | Freq: Once | ORAL | Status: AC
  Administered 2014-08-28: 20 mg via ORAL
  Filled 2014-08-28: qty 1

## 2014-08-28 MED ORDER — OXYCODONE-ACETAMINOPHEN 5-325 MG PO TABS: 1.0000 | ORAL_TABLET | ORAL | Status: DC | PRN

## 2014-08-28 MED ORDER — SODIUM CHLORIDE 0.9 % IV BOLUS (SEPSIS)
1000.0000 mL | Freq: Once | INTRAVENOUS | Status: AC
  Administered 2014-08-28: 1000 mL via INTRAVENOUS

## 2014-08-28 NOTE — ED Provider Notes (Signed)
Pt left at change of shift to get results of [place] RUQ. Pt has known gallstones, but hasn't had surgery due to lack of funds. She states she went to [facility] before but couldn't come up with the up front fees.  States she's been having the pain off and on for years. Reports 2 days of constant RUQ pain with epigastric burning but she's never had before with normal labs tonight except for mildly elevated lipase of 72.   PCP [place]  Patient has epigastric tenderness with mild right upper quadrant tenderness.  1120 discussed with Dr. Arnoldo Morale. He does not feel she meets criteria for admission at this time. He would be glad to followup in his office and to hopefully arrange temporary Medicaid or [place] orange card or she can see her PCP to have that arranged. Agrees to starting on PPI  US Abdomen Complete  08/28/2014   CLINICAL DATA:  Pain.  EXAM: ULTRASOUND ABDOMEN COMPLETE  COMPARISON:  Ultrasound 05/05/2010.  FINDINGS: Gallbladder:  Mobile 1.6 cm gallstone is noted. Gallbladder wall thickness 2.7 mm. Positive Murphy sign. No pericholecystic fluid collections.  Common bile duct:  Diameter: 4.8 mm  Liver:  Liver is prominent 18.2 cm. No focal hepatic abnormality identified.  IVC:  No abnormality visualized.  Pancreas:  Visualized portion unremarkable.  Spleen:  Size and appearance within normal limits.  Right Kidney:  Length: 10.3 cm. Echogenicity within normal limits. No mass or hydronephrosis visualized.  Left Kidney:  Length: 11.9 cm. Echogenicity within normal limits. No mass or hydronephrosis visualized.  Abdominal aorta:  No aneurysm visualized.  Other findings:  None.  IMPRESSION: 1. Mobile 1.6 cm gallstone. Positive ultrasound Murphy sign. No prominent gallbladder wall thickening. No pericholecystic fluid. No biliary distention. 2. Mild hepatic prominence at 18.2 cm. No focal hepatic abnormality identified.   Electronically Signed   By: Marcello Moores  Register   On: 08/28/2014 10:32   Diagnoses that  have been ruled out:  None  Diagnoses that are still under consideration:  None  Final diagnoses:  Abdominal pain, right upper quadrant    New Prescriptions   OMEPRAZOLE (PRILOSEC) 20 MG CAPSULE    Take 1 capsule (20 mg total) by mouth 2 (two) times daily before a meal.   ONDANSETRON (ZOFRAN) 4 MG TABLET    Take 1 tablet (4 mg total) by mouth every 8 (eight) hours as needed for nausea or vomiting.   OXYCODONE-ACETAMINOPHEN (PERCOCET/ROXICET) 5-325 MG PER TABLET    Take 1 tablet by mouth every 4 (four) hours as needed for severe pain.    Plan discharge  Rolland Porter, MD, Barbette Or, MD, Alanson Aly, MD 08/28/14 [phone]

## 2014-08-28 NOTE — ED Provider Notes (Signed)
CSN: [id number]     Arrival date & time 08/28/14  0608 History   First MD Initiated Contact with Patient 08/28/14 [phone]     Chief Complaint  Patient presents with  . Abdominal Pain     (Consider location/radiation/quality/duration/timing/severity/associated sxs/prior Treatment) HPI Comments: 53 year old female with diabetes, high cholesterol, breast cancer, gallstones, fibromyalgia presents with worsening right upper and epigastric burning and sharp tenderness, severe worsening for the past 2 days. Worse after eating. Similar to previous gallstone pain. Patient was unable to get gallbladder removed in the past due to insurance reasons. Mild radiation to the back. No ulcer history or bleeding.  Patient is a 54 y.o. female presenting with abdominal pain. The history is provided by the patient.  Abdominal Pain Associated symptoms: nausea and vomiting   Associated symptoms: no chest pain, no chills, no dysuria, no fever and no shortness of breath     Past Medical History  Diagnosis Date  . Diabetes mellitus   . Breast cancer   . Depression   . Migraines   . Gall bladder stones    Past Surgical History  Procedure Laterality Date  . Mastectomy      Bilateral   Family History  Problem Relation Age of Onset  . Cancer Mother   . Heart failure Mother   . Diabetes Father   . Heart failure Father   . Cancer Father   . Stroke Father    History  Substance Use Topics  . Smoking status: Current Every Day Smoker -- 0.50 packs/day for 17 years    Types: Cigarettes  . Smokeless tobacco: Never Used  . Alcohol Use: No   OB History   Grav Para Term Preterm Abortions TAB SAB Ect Mult Living   2 1 1  1  1   1      Review of Systems  Constitutional: Negative for fever and chills.  HENT: Negative for congestion.   Eyes: Negative for visual disturbance.  Respiratory: Negative for shortness of breath.   Cardiovascular: Negative for chest pain.  Gastrointestinal: Positive for nausea,  vomiting and abdominal pain.  Genitourinary: Negative for dysuria and flank pain.  Musculoskeletal: Negative for back pain, neck pain and neck stiffness.  Skin: Negative for rash.  Neurological: Negative for light-headedness and headaches.      Allergies  Nitrofurantoin; Elavil; Lipitor; and Other  Home Medications   Prior to Admission medications   Medication Sig Start Date End Date Taking? Authorizing Provider  cephALEXin (KEFLEX) 500 MG capsule Take 1 capsule (500 mg total) by mouth 2 (two) times daily. 08/18/14   Zigmund Gottron, MD  citalopram (CELEXA) 40 MG tablet Take 1 tablet (40 mg total) by mouth daily. 01/13/14   Zigmund Gottron, MD  ibuprofen (ADVIL,MOTRIN) 200 MG tablet Take 600 mg by mouth every 6 (six) hours as needed. For back pain     Historical Provider, MD  lisinopril (PRINIVIL,ZESTRIL) 5 MG tablet Take 1 tablet (5 mg total) by mouth daily. 08/02/14   Zigmund Gottron, MD  metFORMIN (GLUCOPHAGE) 1000 MG tablet TAKE 1 TABLET BY MOUTH TWICE DAILY    Zigmund Gottron, MD  simvastatin (ZOCOR) 10 MG tablet Take 1 tablet (10 mg total) by mouth at bedtime. 08/02/14   Zigmund Gottron, MD   BP 133/86  Pulse 85  Temp(Src) 98.7 F (37.1 C) (Oral)  Resp 18  Ht 5\' 6"  (1.676 m)  Wt 167 lb (75.751 kg)  BMI 26.97 kg/m2  SpO2 96% Physical  Exam  Nursing note and vitals reviewed. Constitutional: She is oriented to person, place, and time. She appears well-developed and well-nourished.  HENT:  Head: Normocephalic and atraumatic.  Mild dry mucous membranes  Eyes: Conjunctivae are normal. Right eye exhibits no discharge. Left eye exhibits no discharge.  Neck: Normal range of motion. Neck supple. No tracheal deviation present.  Cardiovascular: Normal rate and regular rhythm.   Pulmonary/Chest: Effort normal and breath sounds normal.  Abdominal: Soft. She exhibits no distension. There is tenderness. There is no guarding.  Musculoskeletal: She exhibits no  edema.  Neurological: She is alert and oriented to person, place, and time.  Skin: Skin is warm. No rash noted.  Psychiatric: She has a normal mood and affect.    ED Course  Procedures (including critical care time) Labs Review Labs Reviewed  CBC WITH DIFFERENTIAL  COMPREHENSIVE METABOLIC PANEL  LIPASE, BLOOD  URINALYSIS, ROUTINE W REFLEX MICROSCOPIC    Imaging Review No results found.   EKG Interpretation None      MDM   Final diagnoses:  None   Patient known gallstones presents with worsening upper abdominal pain similar to previous. Plan for abdominal labs, formal ultrasound of the gallbladder and upper abdomen to look for signs of infected gallbladder. Pain medicines, nausea medicines and IV fluids. Patient will be signed out to followup labs, ultrasound imaging and reassess the patient.  Filed Vitals:   08/28/14 0623  BP: 133/86  Pulse: 85  Temp: 98.7 F (37.1 C)  Resp: 18     RUQ abdominal pain, Vomiting   Mariea Clonts, MD 08/28/14 [phone]

## 2014-08-28 NOTE — Discharge Instructions (Signed)
Avoid fried, spicy or greasy foods. Take the medications as prescribed. You can either see your PCP to discuss if you would be eligible for an [place] or temporary medicaid so you can have your surgery or you can be seen by Dr Arnoldo Morale, the surgeon on call in [place] to discuss the same thing.  Return to the ED if you get a fever, or have uncontrolled vomiting or pain.  Biliary Colic  Biliary colic is a steady or irregular pain in the upper abdomen. It is usually under the right side of the rib cage. It happens when gallstones interfere with the normal flow of bile from the gallbladder. Bile is a liquid that helps to digest fats. Bile is made in the liver and stored in the gallbladder. When you eat a meal, bile passes from the gallbladder through the cystic duct and the common bile duct into the small intestine. There, it mixes with partially digested food. If a gallstone blocks either of these ducts, the normal flow of bile is blocked. The muscle cells in the bile duct contract forcefully to try to move the stone. This causes the pain of biliary colic.  SYMPTOMS   A person with biliary colic usually complains of pain in the upper abdomen. This pain can be:  In the center of the upper abdomen just below the breastbone.  In the upper-right part of the abdomen, near the gallbladder and liver.  Spread back toward the right shoulder blade.  Nausea and vomiting.  The pain usually occurs after eating.  Biliary colic is usually triggered by the digestive system's demand for bile. The demand for bile is high after fatty meals. Symptoms can also occur when a person who has been fasting suddenly eats a very large meal. Most episodes of biliary colic pass after 1 to 5 hours. After the most intense pain passes, your abdomen may continue to ache mildly for about 24 hours. DIAGNOSIS  After you describe your symptoms, your caregiver will perform a physical exam. He or she will pay attention to  the upper right portion of your belly (abdomen). This is the area of your liver and gallbladder. An ultrasound will help your caregiver look for gallstones. Specialized scans of the gallbladder may also be done. Blood tests may be done, especially if you have fever or if your pain persists. PREVENTION  Biliary colic can be prevented by controlling the risk factors for gallstones. Some of these risk factors, such as heredity, increasing age, and pregnancy are a normal part of life. Obesity and a high-fat diet are risk factors you can change through a healthy lifestyle. Women going through menopause who take hormone replacement therapy (estrogen) are also more likely to develop biliary colic. TREATMENT   Pain medication may be prescribed.  You may be encouraged to eat a fat-free diet.  If the first episode of biliary colic is severe, or episodes of colic keep retuning, surgery to remove the gallbladder (cholecystectomy) is usually recommended. This procedure can be done through small incisions using an instrument called a laparoscope. The procedure often requires a brief stay in the hospital. Some people can leave the hospital the same day. It is the most widely used treatment in people troubled by painful gallstones. It is effective and safe, with no complications in more than 90% of cases.  If surgery cannot be done, medication that dissolves gallstones may be used. This medication is expensive and can take months or years to work. Only  small stones will dissolve.  Rarely, medication to dissolve gallstones is combined with a procedure called shock-wave lithotripsy. This procedure uses carefully aimed shock waves to break up gallstones. In many people treated with this procedure, gallstones form again within a few years. PROGNOSIS  If gallstones block your cystic duct or common bile duct, you are at risk for repeated episodes of biliary colic. There is also a 25% chance that you will develop a  gallbladder infection(acute cholecystitis), or some other complication of gallstones within 10 to 20 years. If you have surgery, schedule it at a time that is convenient for you and at a time when you are not sick. HOME CARE INSTRUCTIONS   Drink plenty of clear fluids.  Avoid fatty, greasy or fried foods, or any foods that make your pain worse.  Take medications as directed. SEEK MEDICAL CARE IF:   You develop a fever over 100.5 F (38.1 C).  Your pain gets worse over time.  You develop nausea that prevents you from eating and drinking.  You develop vomiting. SEEK IMMEDIATE MEDICAL CARE IF:   You have continuous or severe belly (abdominal) pain which is not relieved with medications.  You develop nausea and vomiting which is not relieved with medications.  You have symptoms of biliary colic and you suddenly develop a fever and shaking chills. This may signal cholecystitis. Call your caregiver immediately.  You develop a yellow color to your skin or the white part of your eyes (jaundice). Document Released: 04/22/2006 Document Revised: 02/11/2012 Document Reviewed: 07/01/2008 [facility] Patient Information 2015 [place], [place]. This information is not intended to replace advice given to you by your health care provider. Make sure you discuss any questions you have with your health care provider.

## 2014-08-28 NOTE — ED Notes (Signed)
Upper mid abd pain with nausea and vomiting. Known cholecystitis in past.

## 2014-08-28 NOTE — ED Notes (Signed)
Radiology notified of [place], anticipated [place] at around [place]

## 2014-08-28 NOTE — ED Notes (Signed)
US done

## 2014-08-29 ENCOUNTER — Other Ambulatory Visit: Payer: Self-pay | Admitting: Family Medicine

## 2014-09-16 ENCOUNTER — Other Ambulatory Visit: Payer: Self-pay | Admitting: Family Medicine

## 2014-09-16 DIAGNOSIS — K851 Biliary acute pancreatitis without necrosis or infection: Secondary | ICD-10-CM | POA: Insufficient documentation

## 2014-09-16 NOTE — Progress Notes (Signed)
Never filled percocet or zofran from ER.  She returned the Rx to me.

## 2014-10-04 ENCOUNTER — Encounter (HOSPITAL_COMMUNITY): Payer: Self-pay | Admitting: Emergency Medicine

## 2014-10-29 ENCOUNTER — Emergency Department (HOSPITAL_COMMUNITY)
Admission: EM | Admit: 2014-10-29 | Discharge: 2014-10-29 | Disposition: A | Discharge status: Home / Self Care | Payer: Self-pay | Attending: Emergency Medicine | Admitting: Emergency Medicine

## 2014-10-29 ENCOUNTER — Emergency Department (HOSPITAL_COMMUNITY): Payer: Self-pay

## 2014-10-29 ENCOUNTER — Encounter (HOSPITAL_COMMUNITY): Payer: Self-pay | Admitting: Emergency Medicine

## 2014-10-29 DIAGNOSIS — J209 Acute bronchitis, unspecified: Secondary | ICD-10-CM | POA: Insufficient documentation

## 2014-10-29 DIAGNOSIS — E119 Type 2 diabetes mellitus without complications: Secondary | ICD-10-CM | POA: Insufficient documentation

## 2014-10-29 DIAGNOSIS — Z7952 Long term (current) use of systemic steroids: Secondary | ICD-10-CM | POA: Insufficient documentation

## 2014-10-29 DIAGNOSIS — M549 Dorsalgia, unspecified: Secondary | ICD-10-CM | POA: Insufficient documentation

## 2014-10-29 DIAGNOSIS — Z853 Personal history of malignant neoplasm of breast: Secondary | ICD-10-CM | POA: Insufficient documentation

## 2014-10-29 DIAGNOSIS — N390 Urinary tract infection, site not specified: Secondary | ICD-10-CM | POA: Insufficient documentation

## 2014-10-29 DIAGNOSIS — J40 Bronchitis, not specified as acute or chronic: Secondary | ICD-10-CM

## 2014-10-29 DIAGNOSIS — Z79899 Other long term (current) drug therapy: Secondary | ICD-10-CM | POA: Insufficient documentation

## 2014-10-29 DIAGNOSIS — Z720 Tobacco use: Secondary | ICD-10-CM | POA: Insufficient documentation

## 2014-10-29 DIAGNOSIS — G43909 Migraine, unspecified, not intractable, without status migrainosus: Secondary | ICD-10-CM | POA: Insufficient documentation

## 2014-10-29 DIAGNOSIS — F329 Major depressive disorder, single episode, unspecified: Secondary | ICD-10-CM | POA: Insufficient documentation

## 2014-10-29 LAB — URINALYSIS, ROUTINE W REFLEX MICROSCOPIC
Bilirubin Urine: NEGATIVE
Glucose, UA: NEGATIVE mg/dL
Ketones, ur: NEGATIVE mg/dL
Nitrite: POSITIVE — AB
PH: 5.5 (ref 5.0–8.0)
PROTEIN: NEGATIVE mg/dL
Specific Gravity, Urine: 1.01 (ref 1.005–1.030)
Urobilinogen, UA: 0.2 mg/dL (ref 0.0–1.0)

## 2014-10-29 LAB — URINE MICROSCOPIC-ADD ON

## 2014-10-29 MED ORDER — CIPROFLOXACIN HCL 500 MG PO TABS: 500.0000 mg | ORAL_TABLET | Freq: Two times a day (BID) | ORAL | Status: DC

## 2014-10-29 MED ORDER — PROMETHAZINE-CODEINE 6.25-10 MG/5ML PO SYRP: 5.0000 mL | ORAL_SOLUTION | Freq: Four times a day (QID) | ORAL | Status: DC | PRN

## 2014-10-29 MED ORDER — PHENAZOPYRIDINE HCL 200 MG PO TABS: 200.0000 mg | ORAL_TABLET | Freq: Three times a day (TID) | ORAL | Status: DC

## 2014-10-29 MED ORDER — ALBUTEROL SULFATE HFA 108 (90 BASE) MCG/ACT IN AERS
1.0000 | INHALATION_SPRAY | RESPIRATORY_TRACT | Status: DC
Start: 2014-10-29 — End: 2014-10-29
  Administered 2014-10-29: 2 via RESPIRATORY_TRACT
  Filled 2014-10-29: qty 6.7

## 2014-10-29 MED ORDER — IPRATROPIUM-ALBUTEROL 0.5-2.5 (3) MG/3ML IN SOLN
3.0000 mL | Freq: Once | RESPIRATORY_TRACT | Status: AC
  Administered 2014-10-29: 3 mL via RESPIRATORY_TRACT
  Filled 2014-10-29: qty 3

## 2014-10-29 MED ORDER — PREDNISONE 10 MG PO TABS: 20.0000 mg | ORAL_TABLET | Freq: Two times a day (BID) | ORAL | Status: DC

## 2014-10-29 NOTE — ED Notes (Signed)
Patient given inhaler and instructed on proper use by Resp. Therapist.

## 2014-10-29 NOTE — ED Provider Notes (Signed)
CSN: [id number]     Arrival date & time 10/29/14  1119 History  This chart was scribed for [facility], NP with Babette Relic, MD by Edison Simon, ED Scribe. This patient was seen in room APFT23/APFT23 and the patient's care was started at 11:54 AM.    Chief Complaint  Patient presents with  . Cough   Patient is a 53 y.o. female presenting with cough. The history is provided by the patient. No language interpreter was used.  Cough Cough characteristics:  Productive Sputum characteristics: beige. Severity:  Moderate Onset quality:  Gradual Duration:  2 weeks Timing:  Constant Chronicity:  New Smoker: yes   Relieved by:  Nothing Worsened by:  Nothing tried Ineffective treatments: Augmentin, Dayquil/Nyquil, cough drops, Robitussin. Associated symptoms: rhinorrhea and sore throat   Associated symptoms: no fever     HPI Comments: Makayla Owens is a 53 y.o. female who presents to the Emergency Department complaining of cough and congestion with onset 2 weeks ago and also complains of UTI symptoms with onset at 0100 today. She reports associated rhinorrhea that is clear but notes that her cough produces beige colored sputum. She also reports associated tongue and throat burning. She states she recently finished a 5 day course of Augmentin, for which she had a refill from a previous prescription, without remission. She states she has also used cough drops, Robitussin, and Nyquil and Dayquil without remission of symptoms. She states that she smokes and is now down to 3 cigarettes a day. She states she does not have an inhaler. She states that she started Lisinopril 2 months ago and has stopped it since her cough began, as per pharmacist's instructions. She notes a history of UTIs and states her symptoms now are similar to prior UTIs, including back pain, dysuria, frequency, and urgency. She denies fever at home though her temperature here was measured at 99.2.   Past Medical History  Diagnosis  Date  . Diabetes mellitus   . Breast cancer   . Depression   . Migraines   . Gall bladder stones    Past Surgical History  Procedure Laterality Date  . Mastectomy      Bilateral  . Mastectomy Bilateral    Family History  Problem Relation Age of Onset  . Cancer Mother   . Heart failure Mother   . Diabetes Father   . Heart failure Father   . Cancer Father   . Stroke Father    History  Substance Use Topics  . Smoking status: Current Every Day Smoker -- 0.25 packs/day for 17 years    Types: Cigarettes  . Smokeless tobacco: Never Used  . Alcohol Use: No   OB History    Gravida Para Term Preterm AB TAB SAB Ectopic Multiple Living   2 1 1  1  1   1      Review of Systems  Constitutional: Negative for fever.  HENT: Positive for congestion, rhinorrhea and sore throat.        Burning sensation to tongue  Respiratory: Positive for cough.   Genitourinary: Positive for dysuria, urgency and frequency.  Musculoskeletal: Positive for back pain.  all other systems negataive    Allergies  Nitrofurantoin; Elavil; Lipitor; Other; and Statins  Home Medications   Prior to Admission medications   Medication Sig Start Date End Date Taking? Authorizing Provider  ciprofloxacin (CIPRO) 500 MG tablet Take 1 tablet (500 mg total) by mouth 2 (two) times daily. 10/29/14   Hope  Bunnie Pion, NP  citalopram (CELEXA) 40 MG tablet Take 1 tablet (40 mg total) by mouth daily. 01/13/14   Zigmund Gottron, MD  ibuprofen (ADVIL,MOTRIN) 200 MG tablet Take 600 mg by mouth every 6 (six) hours as needed. For back pain     Historical Provider, MD  lisinopril (PRINIVIL,ZESTRIL) 5 MG tablet Take 1 tablet (5 mg total) by mouth daily. 08/02/14   Zigmund Gottron, MD  metFORMIN (GLUCOPHAGE) 1000 MG tablet Take 1 tablet (1,000 mg total) by mouth 2 (two) times daily with a meal. 08/30/14   Zigmund Gottron, MD  omeprazole (PRILOSEC) 20 MG capsule Take 1 capsule (20 mg total) by mouth 2 (two) times daily  before a meal. 08/28/14   Janice Norrie, MD  phenazopyridine (PYRIDIUM) 200 MG tablet Take 1 tablet (200 mg total) by mouth 3 (three) times daily. 10/29/14   Hope Bunnie Pion, NP  predniSONE (DELTASONE) 10 MG tablet Take 2 tablets (20 mg total) by mouth 2 (two) times daily with a meal. 10/29/14   Hope Bunnie Pion, NP  promethazine-codeine (PHENERGAN WITH CODEINE) 6.25-10 MG/5ML syrup Take 5 mLs by mouth every 6 (six) hours as needed for cough. 10/29/14   Hope Bunnie Pion, NP  simvastatin (ZOCOR) 10 MG tablet Take 1 tablet (10 mg total) by mouth at bedtime. 08/02/14   Zigmund Gottron, MD  vitamin C (ASCORBIC ACID) 500 MG tablet Take 500 mg by mouth daily.    Historical Provider, MD   BP 118/75 mmHg  Pulse 101  Temp(Src) 99.2 F (37.3 C) (Oral)  Resp 18  Ht 5\' 6"  (1.676 m)  Wt 157 lb (71.215 kg)  BMI 25.35 kg/m2  SpO2 94% Physical Exam  Constitutional: She is oriented to person, place, and time. She appears well-developed and well-nourished.  HENT:  Head: Normocephalic and atraumatic.  Right Ear: Tympanic membrane normal.  Left Ear: Tympanic membrane normal.  Mouth/Throat: Uvula is midline and mucous membranes are normal. Posterior oropharyngeal erythema present.      Eyes: Conjunctivae and EOM are normal. Pupils are equal, round, and reactive to light.  Neck: Normal range of motion. Neck supple.  Cardiovascular: Normal rate and regular rhythm.   Pulmonary/Chest: Effort normal. No respiratory distress. She has wheezes (inspiratory and expiratory wheezes bilaterally). She has no rales.  Abdominal: There is tenderness in the suprapubic area. There is no CVA tenderness.  Musculoskeletal: Normal range of motion.  Lymphadenopathy:    She has no cervical adenopathy.  Neurological: She is alert and oriented to person, place, and time.  Skin: Skin is warm and dry.  Psychiatric: She has a normal mood and affect. Her behavior is normal.  Nursing note and vitals reviewed.   ED Course  Procedures  (including critical care time) Duoneb given and patient's symptoms improved.   DIAGNOSTIC STUDIES: Oxygen Saturation is 94% on room air, adequate by my interpretation.    COORDINATION OF CARE: 11:59 AM Discussed treatment plan with patient at beside, including chest x-ray and UA. The patient agrees with the plan and has no further questions at this time.   Labs Review Results for orders placed or performed during the hospital encounter of 10/29/14 (from the past 24 hour(s))  U/A (may I&O cath if menses)     Status: Abnormal   Collection Time: 10/29/14 11:39 AM  Result Value Ref Range   Color, Urine YELLOW YELLOW   APPearance CLEAR CLEAR   Specific Gravity, Urine 1.010 1.005 - 1.030   pH 5.5  5.0 - 8.0   Glucose, UA NEGATIVE NEGATIVE mg/dL   Hgb urine dipstick TRACE (A) NEGATIVE   Bilirubin Urine NEGATIVE NEGATIVE   Ketones, ur NEGATIVE NEGATIVE mg/dL   Protein, ur NEGATIVE NEGATIVE mg/dL   Urobilinogen, UA 0.2 0.0 - 1.0 mg/dL   Nitrite POSITIVE (A) NEGATIVE   Leukocytes, UA LARGE (A) NEGATIVE  Urine microscopic-add on     Status: Abnormal   Collection Time: 10/29/14 11:39 AM  Result Value Ref Range   Squamous Epithelial / LPF RARE RARE   WBC, UA 21-50 <3 WBC/hpf   RBC / HPF 0-2 <3 RBC/hpf   Bacteria, UA MANY (A) RARE      Dg Chest 2 View  10/29/2014   CLINICAL DATA:  Productive cough and wheezing for 2 weeks.  EXAM: CHEST  2 VIEW  COMPARISON:  Chest radiograph 06/02/2009  FINDINGS: The heart size and mediastinal contours are within normal limits. Both lungs are well expanded and clear. The visualized skeletal structures are unremarkable.  IMPRESSION: No active cardiopulmonary disease.   Electronically Signed   By: Curlene Dolphin M.D.   On: 10/29/2014 12:19     MDM  53 y.o. female with bronchitis and UTI. Will treat for UTI and send urine for culture. Will treat bronchitis by adding inhaler, cough medication and prednisone. She is to follow up with her PCP or return here for  worsening symptoms. Stable for discharge without respiratory distress. Albuterol inhaler with instructions per Respiratory given prior to discharge.    Medication List    TAKE these medications        ciprofloxacin 500 MG tablet  Commonly known as:  CIPRO  Take 1 tablet (500 mg total) by mouth 2 (two) times daily.     phenazopyridine 200 MG tablet  Commonly known as:  PYRIDIUM  Take 1 tablet (200 mg total) by mouth 3 (three) times daily.     predniSONE 10 MG tablet  Commonly known as:  DELTASONE  Take 2 tablets (20 mg total) by mouth 2 (two) times daily with a meal.     promethazine-codeine 6.25-10 MG/5ML syrup  Commonly known as:  PHENERGAN with CODEINE  Take 5 mLs by mouth every 6 (six) hours as needed for cough.      ASK your doctor about these medications        citalopram 40 MG tablet  Commonly known as:  CELEXA  Take 1 tablet (40 mg total) by mouth daily.     ibuprofen 200 MG tablet  Commonly known as:  ADVIL,MOTRIN  Take 600 mg by mouth every 6 (six) hours as needed. For back pain     lisinopril 5 MG tablet  Commonly known as:  PRINIVIL,ZESTRIL  Take 1 tablet (5 mg total) by mouth daily.     metFORMIN 1000 MG tablet  Commonly known as:  GLUCOPHAGE  Take 1 tablet (1,000 mg total) by mouth 2 (two) times daily with a meal.     omeprazole 20 MG capsule  Commonly known as:  PRILOSEC  Take 1 capsule (20 mg total) by mouth 2 (two) times daily before a meal.     simvastatin 10 MG tablet  Commonly known as:  ZOCOR  Take 1 tablet (10 mg total) by mouth at bedtime.     vitamin C 500 MG tablet  Commonly known as:  ASCORBIC ACID  Take 500 mg by mouth daily.         I personally performed the services described in this  documentation, which was scribed in my presence. The recorded information has been reviewed and is accurate.   [place], NP 10/29/14 1416  Babette Relic, MD 10/29/14 [phone]

## 2014-10-29 NOTE — ED Notes (Addendum)
Pt reports continued cough and congestion. Pt reports just finished a round of abx and reports no improvement. Pt also reports burning sensation on tongue and increased urinary frequency. Pt reports history of  Recurrent uti's. nad noted. Pt denies any n/v/d,abdominal pain.no swelling noted to tongue. Airway patent.

## 2014-11-02 LAB — URINE CULTURE: Colony Count: >=100000

## 2014-11-03 ENCOUNTER — Telehealth (HOSPITAL_BASED_OUTPATIENT_CLINIC_OR_DEPARTMENT_OTHER): Payer: Self-pay | Admitting: Emergency Medicine

## 2014-11-03 NOTE — Telephone Encounter (Signed)
Post ED Visit - Positive Culture Follow-up  Culture report reviewed by antimicrobial stewardship pharmacist: []  Wes Turner, Pharm.D., BCPS [x]  Heide Guile, Pharm.D., BCPS []  Alycia Rossetti, Pharm.D., BCPS []  [place], Pharm.D., BCPS, AAHIVP []  Legrand Como, Pharm.D., BCPS, AAHIVP []  Elicia Lamp, Pharm.D.   Positive urine culture Klebsiella Treated with Ciprofloxacin, organism sensitive to the same and no further patient follow-up is required at this time.  Hazle Nordmann 11/03/2014, 9:57 AM

## 2014-11-11 ENCOUNTER — Other Ambulatory Visit: Payer: Self-pay | Admitting: Family Medicine

## 2014-11-11 DIAGNOSIS — E119 Type 2 diabetes mellitus without complications: Secondary | ICD-10-CM

## 2014-11-11 MED ORDER — VALSARTAN 80 MG PO TABS: 80.0000 mg | ORAL_TABLET | Freq: Every day | ORAL | Status: DC

## 2014-11-11 NOTE — Assessment & Plan Note (Signed)
>>  ASSESSMENT AND PLAN FOR DM (DIABETES MELLITUS), TYPE 2 WITH RENAL COMPLICATIONS ([facility]CC) WRITTEN ON 11/11/2014  3:02 PM BY HENSEL, Richrd Prime, MD  Cough with ACE.  Switch to ARB

## 2014-11-11 NOTE — Assessment & Plan Note (Signed)
Cough with ACE.  Switch to ARB

## 2014-11-12 ENCOUNTER — Telehealth: Payer: Self-pay | Admitting: *Deleted

## 2014-11-12 MED ORDER — LOSARTAN POTASSIUM 50 MG PO TABS: 50.0000 mg | ORAL_TABLET | Freq: Every day | ORAL | Status: DC

## 2014-11-12 NOTE — Telephone Encounter (Signed)
Received a fax from [place].  Diovan will cost pt $21 for 30 tabs and Losartan $10 for 30 tabs.  Verbal order given by Dr. Andria Frames to change to Losartan 50 mg 1 tab PO once daily #30, 6 refills and discontinue the Diovan.  Derl Barrow, RN

## 2015-01-19 ENCOUNTER — Ambulatory Visit: Payer: Self-pay | Admitting: Family Medicine

## 2015-01-19 ENCOUNTER — Emergency Department (HOSPITAL_COMMUNITY)
Admission: EM | Admit: 2015-01-19 | Discharge: 2015-01-19 | Disposition: A | Discharge status: Home / Self Care | Payer: Self-pay | Source: Home / Self Care

## 2015-01-19 ENCOUNTER — Encounter (HOSPITAL_COMMUNITY): Payer: Self-pay | Admitting: Family Medicine

## 2015-01-19 DIAGNOSIS — B029 Zoster without complications: Secondary | ICD-10-CM

## 2015-01-19 MED ORDER — ACYCLOVIR 800 MG PO TABS: 800.0000 mg | ORAL_TABLET | Freq: Every day | ORAL | Status: DC

## 2015-01-19 MED ORDER — DEXAMETHASONE SODIUM PHOSPHATE 10 MG/ML IJ SOLN
INTRAMUSCULAR | Status: AC
  Filled 2015-01-19: qty 1

## 2015-01-19 MED ORDER — DEXAMETHASONE 1 MG/ML PO CONC
10.0000 mg | Freq: Once | ORAL | Status: AC
  Administered 2015-01-19: 10 mg via ORAL

## 2015-01-19 MED ORDER — DEXAMETHASONE SODIUM PHOSPHATE 4 MG/ML IJ SOLN: 10.0000 mg | Freq: Once | INTRAMUSCULAR | Status: DC

## 2015-01-19 MED ORDER — IBUPROFEN 800 MG PO TABS: 800.0000 mg | ORAL_TABLET | Freq: Three times a day (TID) | ORAL | Status: DC | PRN

## 2015-01-19 NOTE — ED Notes (Signed)
Pt  Has       Symptoms      Of  Rash   That   Is  Burning  In  Nature     And  The    Rash  Began  yest

## 2015-01-19 NOTE — Discharge Instructions (Signed)
You have shingles.  Your given a shot of steroids to help with the irritation. Please take the acyclovir as prescribed. Using continue to use the Benadryl pills for additional relief. Please use the Motrin for pain. Please come back if your symptoms get worse.

## 2015-01-19 NOTE — ED Provider Notes (Addendum)
CSN: [id number]     Arrival date & time 01/19/15  0907 History   None    Chief Complaint  Patient presents with  . Rash   (Consider location/radiation/quality/duration/timing/severity/associated sxs/prior Treatment) HPI  DM: cbg typically in the 150 range  Rash on L arm: started 1 daya ago. Sharp pain and "welps." also burns adn itches. Benadryl w/o benefit. Chicken pox as a child. Getting worse. Constant. No radiation. Denies fevers, chills, chest pain, shortness of breath, numbness or tingling in arms and hands. Denies recent change in bedding, lounging furniture, no recent travel or staying in other people's homes. No other individuals in the house affected with similar rash.   Past Medical History  Diagnosis Date  . Diabetes mellitus   . Breast cancer   . Depression   . Migraines   . Gall bladder stones    Past Surgical History  Procedure Laterality Date  . Mastectomy      Bilateral  . Mastectomy Bilateral    Family History  Problem Relation Age of Onset  . Cancer Mother   . Heart failure Mother   . Diabetes Father   . Heart failure Father   . Cancer Father   . Stroke Father    History  Substance Use Topics  . Smoking status: Current Every Day Smoker -- 0.25 packs/day for 17 years    Types: Cigarettes  . Smokeless tobacco: Never Used  . Alcohol Use: No   OB History    Gravida Para Term Preterm AB TAB SAB Ectopic Multiple Living   2 1 1  1  1   1      Review of Systems Per HPI with all other pertinent systems negative.   Allergies  Nitrofurantoin; Elavil; Lipitor; Other; and Statins  Home Medications   Prior to Admission medications   Medication Sig Start Date End Date Taking? Authorizing Provider  acyclovir (ZOVIRAX) 800 MG tablet Take 1 tablet (800 mg total) by mouth 5 (five) times daily. 01/19/15   Waldemar Dickens, MD  citalopram (CELEXA) 40 MG tablet Take 1 tablet (40 mg total) by mouth daily. 01/13/14   Zigmund Gottron, MD  ibuprofen  (ADVIL,MOTRIN) 800 MG tablet Take 1 tablet (800 mg total) by mouth every 8 (eight) hours as needed. 01/19/15   Waldemar Dickens, MD  losartan (COZAAR) 50 MG tablet Take 1 tablet (50 mg total) by mouth daily. 11/12/14   Zigmund Gottron, MD  metFORMIN (GLUCOPHAGE) 1000 MG tablet Take 1 tablet (1,000 mg total) by mouth 2 (two) times daily with a meal. 08/30/14   Zigmund Gottron, MD  omeprazole (PRILOSEC) 20 MG capsule Take 1 capsule (20 mg total) by mouth 2 (two) times daily before a meal. 08/28/14   Janice Norrie, MD  phenazopyridine (PYRIDIUM) 200 MG tablet Take 1 tablet (200 mg total) by mouth 3 (three) times daily. 10/29/14   Hope Bunnie Pion, NP  predniSONE (DELTASONE) 10 MG tablet Take 2 tablets (20 mg total) by mouth 2 (two) times daily with a meal. 10/29/14   Hope Bunnie Pion, NP  promethazine-codeine (PHENERGAN WITH CODEINE) 6.25-10 MG/5ML syrup Take 5 mLs by mouth every 6 (six) hours as needed for cough. 10/29/14   Hope Bunnie Pion, NP  simvastatin (ZOCOR) 10 MG tablet Take 1 tablet (10 mg total) by mouth at bedtime. 08/02/14   Zigmund Gottron, MD  vitamin C (ASCORBIC ACID) 500 MG tablet Take 500 mg by mouth daily.    Historical Provider, MD  BP 124/78 mmHg  Pulse 72  Temp(Src) 98.6 F (37 C) (Oral)  Resp 18  SpO2 100% Physical Exam  Constitutional: She is oriented to person, place, and time. She appears well-developed and well-nourished.  HENT:  Head: Normocephalic and atraumatic.  Eyes: EOM are normal. Pupils are equal, round, and reactive to light.  Neck: Normal range of motion.  Pulmonary/Chest: Effort normal. No respiratory distress.  Abdominal: Soft. She exhibits no distension.  Musculoskeletal: Normal range of motion. She exhibits no edema or tenderness.  Neurological: She is alert and oriented to person, place, and time.  Skin:  Left posterior arm with linear patches of macular papular skin lesions with you vesicles.  Psychiatric: She has a normal mood and affect. Her  behavior is normal.    ED Course  Procedures (including critical care time) Labs Review Labs Reviewed - No data to display  Imaging Review No results found.   MDM   1. Shingles    Rash most concerning for shingles. History of chickenpox. Decadron 10 mg by mouth given in clinic. Start acyclovir and Motrin. Patient to use Benadryl for continued itching if needed.  Patient to monitor sugars closely as steroids will likely elevate sugar level.  Precautions given and all questions answered  Linna Darner, MD Family Medicine 01/19/2015, 11:28 AM      Waldemar Dickens, MD 01/19/15 1128  Waldemar Dickens, MD 01/19/15 [phone]

## 2015-02-12 ENCOUNTER — Emergency Department (HOSPITAL_COMMUNITY)
Admission: EM | Admit: 2015-02-12 | Discharge: 2015-02-12 | Disposition: A | Discharge status: Home / Self Care | Payer: Self-pay | Attending: Emergency Medicine | Admitting: Emergency Medicine

## 2015-02-12 ENCOUNTER — Encounter (HOSPITAL_COMMUNITY): Payer: Self-pay | Admitting: *Deleted

## 2015-02-12 DIAGNOSIS — E119 Type 2 diabetes mellitus without complications: Secondary | ICD-10-CM | POA: Insufficient documentation

## 2015-02-12 DIAGNOSIS — Z8679 Personal history of other diseases of the circulatory system: Secondary | ICD-10-CM | POA: Insufficient documentation

## 2015-02-12 DIAGNOSIS — F329 Major depressive disorder, single episode, unspecified: Secondary | ICD-10-CM | POA: Insufficient documentation

## 2015-02-12 DIAGNOSIS — Z7952 Long term (current) use of systemic steroids: Secondary | ICD-10-CM | POA: Insufficient documentation

## 2015-02-12 DIAGNOSIS — B029 Zoster without complications: Secondary | ICD-10-CM | POA: Insufficient documentation

## 2015-02-12 DIAGNOSIS — Z79899 Other long term (current) drug therapy: Secondary | ICD-10-CM | POA: Insufficient documentation

## 2015-02-12 DIAGNOSIS — Z720 Tobacco use: Secondary | ICD-10-CM | POA: Insufficient documentation

## 2015-02-12 DIAGNOSIS — Z853 Personal history of malignant neoplasm of breast: Secondary | ICD-10-CM | POA: Insufficient documentation

## 2015-02-12 DIAGNOSIS — Z8719 Personal history of other diseases of the digestive system: Secondary | ICD-10-CM | POA: Insufficient documentation

## 2015-02-12 MED ORDER — ACYCLOVIR 800 MG PO TABS: 800.0000 mg | ORAL_TABLET | Freq: Every day | ORAL | Status: DC

## 2015-02-12 MED ORDER — PREDNISONE 50 MG PO TABS
60.0000 mg | ORAL_TABLET | Freq: Once | ORAL | Status: AC
  Administered 2015-02-12: 60 mg via ORAL
  Filled 2015-02-12 (×2): qty 1

## 2015-02-12 MED ORDER — ACYCLOVIR 200 MG PO CAPS
800.0000 mg | ORAL_CAPSULE | Freq: Once | ORAL | Status: AC
  Administered 2015-02-12: 800 mg via ORAL
  Filled 2015-02-12: qty 4

## 2015-02-12 MED ORDER — PREDNISONE 20 MG PO TABS: ORAL_TABLET | ORAL | Status: DC

## 2015-02-12 MED ORDER — ACYCLOVIR 200 MG PO CAPS
ORAL_CAPSULE | ORAL | Status: AC
  Filled 2015-02-12: qty 4

## 2015-02-12 NOTE — ED Notes (Signed)
Pt had shingles on of Feb. 17th and took full course of Acyclovir. Pt states she had an area pop on her left forearm last week and now she has an area on the back of her left arm. Pt c/o burning itching.

## 2015-02-12 NOTE — Discharge Instructions (Signed)
Take the medications as prescribed. Follow up with Dr Andria Frames this week if you have any concerns.    Shingles Shingles (herpes zoster) is an infection that is caused by the same virus that causes chickenpox (varicella). The infection causes a painful skin rash and fluid-filled blisters, which eventually break open, crust over, and heal. It may occur in any area of the body, but it usually affects only one side of the body or face. The pain of shingles usually lasts about 1 month. However, some people with shingles may develop long-term (chronic) pain in the affected area of the body. Shingles often occurs many years after the person had chickenpox. It is more common:  In people older than 50 years.  In people with weakened immune systems, such as those with HIV, AIDS, or cancer.  In people taking medicines that weaken the immune system, such as transplant medicines.  In people under great stress. CAUSES  Shingles is caused by the varicella zoster virus (VZV), which also causes chickenpox. After a person is infected with the virus, it can remain in the person's body for years in an inactive state (dormant). To cause shingles, the virus reactivates and breaks out as an infection in a nerve root. The virus can be spread from person to person (contagious) through contact with open blisters of the shingles rash. It will only spread to people who have not had chickenpox. When these people are exposed to the virus, they may develop chickenpox. They will not develop shingles. Once the blisters scab over, the person is no longer contagious and cannot spread the virus to others. SIGNS AND SYMPTOMS  Shingles shows up in stages. The initial symptoms may be pain, itching, and tingling in an area of the skin. This pain is usually described as burning, stabbing, or throbbing.In a few days or weeks, a painful red rash will appear in the area where the pain, itching, and tingling were felt. The rash is usually on  one side of the body in a band or belt-like pattern. Then, the rash usually turns into fluid-filled blisters. They will scab over and dry up in approximately 2-3 weeks. Flu-like symptoms may also occur with the initial symptoms, the rash, or the blisters. These may include:  Fever.  Chills.  Headache.  Upset stomach. DIAGNOSIS  Your health care provider will perform a skin exam to diagnose shingles. Skin scrapings or fluid samples may also be taken from the blisters. This sample will be examined under a microscope or sent to a lab for further testing. TREATMENT  There is no specific cure for shingles. Your health care provider will likely prescribe medicines to help you manage the pain, recover faster, and avoid long-term problems. This may include antiviral drugs, anti-inflammatory drugs, and pain medicines. HOME CARE INSTRUCTIONS   Take a cool bath or apply cool compresses to the area of the rash or blisters as directed. This may help with the pain and itching.   Take medicines only as directed by your health care provider.   Rest as directed by your health care provider.  Keep your rash and blisters clean with mild soap and cool water or as directed by your health care provider.  Do not pick your blisters or scratch your rash. Apply an anti-itch cream or numbing creams to the affected area as directed by your health care provider.  Keep your shingles rash covered with a loose bandage (dressing).  Avoid skin contact with:  Babies.   Pregnant women.  Children with eczema.   Elderly people with transplants.   People with chronic illnesses, such as leukemia or AIDS.   Wear loose-fitting clothing to help ease the pain of material rubbing against the rash.  Keep all follow-up visits as directed by your health care provider.If the area involved is on your face, you may receive a referral for a specialist, such as an eye doctor (ophthalmologist) or an ear, nose, and  throat (ENT) doctor. Keeping all follow-up visits will help you avoid eye problems, chronic pain, or disability.  SEEK IMMEDIATE MEDICAL CARE IF:   You have facial pain, pain around the eye area, or loss of feeling on one side of your face.  You have ear pain or ringing in your ear.  You have loss of taste.  Your pain is not relieved with prescribed medicines.   Your redness or swelling spreads.   You have more pain and swelling.  Your condition is worsening or has changed.   You have a fever. MAKE SURE YOU:  Understand these instructions.  Will watch your condition.  Will get help right away if you are not doing well or get worse. Document Released: 11/19/2005 Document Revised: 04/05/2014 Document Reviewed: 07/03/2012 [facility] Patient Information 2015 [place], [place]. This information is not intended to replace advice given to you by your health care provider. Make sure you discuss any questions you have with your health care provider.

## 2015-02-12 NOTE — ED Provider Notes (Signed)
CSN: [id number]     Arrival date & time 02/12/15  0340 History   First MD Initiated Contact with Patient 02/12/15 0443     Chief Complaint  Patient presents with  . Herpes Zoster     (Consider location/radiation/quality/duration/timing/severity/associated sxs/prior Treatment) HPI  Pt reports she had shingles on her left shoulder on 2/17 and took a course of acyclovir and steroids. She had another rash on her left forearm 1 week ago that had a cluster of blisters on it that has resolved without treatment. She denies fever, headache.   PCP Dr Andria Frames  Past Medical History  Diagnosis Date  . Diabetes mellitus   . Breast cancer   . Depression   . Migraines   . Gall bladder stones    Past Surgical History  Procedure Laterality Date  . Mastectomy      Bilateral  . Mastectomy Bilateral    Family History  Problem Relation Age of Onset  . Cancer Mother   . Heart failure Mother   . Diabetes Father   . Heart failure Father   . Cancer Father   . Stroke Father    History  Substance Use Topics  . Smoking status: Current Every Day Smoker -- 0.25 packs/day for 17 years    Types: Cigarettes  . Smokeless tobacco: Never Used  . Alcohol Use: No   Waiting for disability for fibromyalgia Smokes 3 cigs a day  OB History    Gravida Para Term Preterm AB TAB SAB Ectopic Multiple Living   2 1 1  1  1   1      Review of Systems  All other systems reviewed and are negative.     Allergies  Nitrofurantoin; Elavil; Lipitor; Other; and Statins  Home Medications   Prior to Admission medications   Medication Sig Start Date End Date Taking? Authorizing Provider  acyclovir (ZOVIRAX) 800 MG tablet Take 1 tablet (800 mg total) by mouth 5 (five) times daily. 02/12/15   Rolland Porter, MD  citalopram (CELEXA) 40 MG tablet Take 1 tablet (40 mg total) by mouth daily. 01/13/14   Zenia Resides, MD  ibuprofen (ADVIL,MOTRIN) 800 MG tablet Take 1 tablet (800 mg total) by mouth every 8 (eight) hours as  needed. 01/19/15   Waldemar Dickens, MD  losartan (COZAAR) 50 MG tablet Take 1 tablet (50 mg total) by mouth daily. 11/12/14   Zenia Resides, MD  metFORMIN (GLUCOPHAGE) 1000 MG tablet Take 1 tablet (1,000 mg total) by mouth 2 (two) times daily with a meal. 08/30/14   Zenia Resides, MD  omeprazole (PRILOSEC) 20 MG capsule Take 1 capsule (20 mg total) by mouth 2 (two) times daily before a meal. 08/28/14   Rolland Porter, MD  phenazopyridine (PYRIDIUM) 200 MG tablet Take 1 tablet (200 mg total) by mouth 3 (three) times daily. 10/29/14   Hope Bunnie Pion, NP  predniSONE (DELTASONE) 20 MG tablet Take 3 po QD x 3d , then 2 po QD x 3d then 1 po QD x 3d 02/12/15   Rolland Porter, MD  promethazine-codeine (PHENERGAN WITH CODEINE) 6.25-10 MG/5ML syrup Take 5 mLs by mouth every 6 (six) hours as needed for cough. 10/29/14   Hope Bunnie Pion, NP  simvastatin (ZOCOR) 10 MG tablet Take 1 tablet (10 mg total) by mouth at bedtime. 08/02/14   Zenia Resides, MD  vitamin C (ASCORBIC ACID) 500 MG tablet Take 500 mg by mouth daily.    Historical Provider, MD  BP 110/82 mmHg  Pulse 82  Temp(Src) 97.7 F (36.5 C)  Resp 20  Ht 5\' 6"  (1.676 m)  Wt 155 lb (70.308 kg)  BMI 25.03 kg/m2  SpO2 96%  Vital signs normal   Physical Exam  Constitutional: She is oriented to person, place, and time. She appears well-developed and well-nourished.  Non-toxic appearance. She does not appear ill. No distress.  HENT:  Head: Normocephalic and atraumatic.  Right Ear: External ear normal.  Left Ear: External ear normal.  Nose: Nose normal. No mucosal edema or rhinorrhea.  Mouth/Throat: Mucous membranes are normal. No dental abscesses or uvula swelling.  Eyes: Conjunctivae and EOM are normal. Pupils are equal, round, and reactive to light.  Neck: Normal range of motion and full passive range of motion without pain. Neck supple.  Pulmonary/Chest: Effort normal. No respiratory distress. She has no rhonchi. She exhibits no crepitus.  Abdominal:  Normal appearance.  Musculoskeletal: Normal range of motion. She exhibits no edema or tenderness.  Moves all extremities well.   Neurological: She is alert and oriented to person, place, and time. She has normal strength. No cranial nerve deficit.  Skin: Skin is warm, dry and intact. No rash noted. No erythema. No pallor.  Pt has normal skin colored raised lesions in a line on her left posterior upper arm that is not classic for shingles.  No lesions on her back or neck.   Psychiatric: She has a normal mood and affect. Her speech is normal and behavior is normal. Her mood appears not anxious.  Nursing note and vitals reviewed.      ED Course  Procedures (including critical care time)  Medications  acyclovir (ZOVIRAX) 200 MG capsule 800 mg (not administered)  predniSONE (DELTASONE) tablet 60 mg (not administered)   Labs Review Labs Reviewed - No data to display  Imaging Review No results found.   EKG Interpretation None      MDM   Final diagnoses:  Shingles    New Prescriptions   ACYCLOVIR (ZOVIRAX) 800 MG TABLET    Take 1 tablet (800 mg total) by mouth 5 (five) times daily.   PREDNISONE (DELTASONE) 20 MG TABLET    Take 3 po QD x 3d , then 2 po QD x 3d then 1 po QD x 3d    Plan discharge   Rolland Porter, MD, Barbette Or, MD 02/12/15 [phone]

## 2015-03-25 ENCOUNTER — Emergency Department (HOSPITAL_COMMUNITY): Payer: Self-pay

## 2015-03-25 ENCOUNTER — Emergency Department (HOSPITAL_COMMUNITY)
Admission: EM | Admit: 2015-03-25 | Discharge: 2015-03-26 | Disposition: A | Discharge status: Home / Self Care | Payer: Self-pay | Attending: Emergency Medicine | Admitting: Emergency Medicine

## 2015-03-25 ENCOUNTER — Encounter (HOSPITAL_COMMUNITY): Payer: Self-pay | Admitting: Emergency Medicine

## 2015-03-25 DIAGNOSIS — J40 Bronchitis, not specified as acute or chronic: Secondary | ICD-10-CM | POA: Insufficient documentation

## 2015-03-25 DIAGNOSIS — Z8659 Personal history of other mental and behavioral disorders: Secondary | ICD-10-CM | POA: Insufficient documentation

## 2015-03-25 DIAGNOSIS — Z79899 Other long term (current) drug therapy: Secondary | ICD-10-CM | POA: Insufficient documentation

## 2015-03-25 DIAGNOSIS — E119 Type 2 diabetes mellitus without complications: Secondary | ICD-10-CM | POA: Insufficient documentation

## 2015-03-25 DIAGNOSIS — Z8719 Personal history of other diseases of the digestive system: Secondary | ICD-10-CM | POA: Insufficient documentation

## 2015-03-25 DIAGNOSIS — Z953 Presence of xenogenic heart valve: Secondary | ICD-10-CM | POA: Insufficient documentation

## 2015-03-25 DIAGNOSIS — J209 Acute bronchitis, unspecified: Secondary | ICD-10-CM

## 2015-03-25 DIAGNOSIS — J219 Acute bronchiolitis, unspecified: Secondary | ICD-10-CM | POA: Insufficient documentation

## 2015-03-25 DIAGNOSIS — Z720 Tobacco use: Secondary | ICD-10-CM | POA: Insufficient documentation

## 2015-03-25 DIAGNOSIS — Z8679 Personal history of other diseases of the circulatory system: Secondary | ICD-10-CM | POA: Insufficient documentation

## 2015-03-25 LAB — CBC WITH DIFFERENTIAL/PLATELET
BASOS PCT: 1 % (ref 0–1)
Basophils Absolute: 0.1 10*3/uL (ref 0.0–0.1)
Eosinophils Absolute: 0.3 10*3/uL (ref 0.0–0.7)
Eosinophils Relative: 5 % (ref 0–5)
HEMATOCRIT: 41.2 % (ref 36.0–46.0)
HEMOGLOBIN: 13.9 g/dL (ref 12.0–15.0)
LYMPHS ABS: 3.2 10*3/uL (ref 0.7–4.0)
Lymphocytes Relative: 42 % (ref 12–46)
MCH: 31.6 pg (ref 26.0–34.0)
MCHC: 33.7 g/dL (ref 30.0–36.0)
MCV: 93.6 fL (ref 78.0–100.0)
MONO ABS: 0.8 10*3/uL (ref 0.1–1.0)
MONOS PCT: 10 % (ref 3–12)
NEUTROS ABS: 3.2 10*3/uL (ref 1.7–7.7)
Neutrophils Relative %: 42 % — ABNORMAL LOW (ref 43–77)
Platelets: 165 10*3/uL (ref 150–400)
RBC: 4.4 MIL/uL (ref 3.87–5.11)
RDW: 14.1 % (ref 11.5–15.5)
WBC: 7.6 10*3/uL (ref 4.0–10.5)

## 2015-03-25 LAB — COMPREHENSIVE METABOLIC PANEL
ALT: 17 U/L (ref 0–35)
AST: 16 U/L (ref 0–37)
Albumin: 3.8 g/dL (ref 3.5–5.2)
Alkaline Phosphatase: 71 U/L (ref 39–117)
Anion gap: 7 (ref 5–15)
BILIRUBIN TOTAL: 0.5 mg/dL (ref 0.3–1.2)
BUN: 8 mg/dL (ref 6–23)
CALCIUM: 8.9 mg/dL (ref 8.4–10.5)
CO2: 24 mmol/L (ref 19–32)
Chloride: 107 mmol/L (ref 96–112)
Creatinine, Ser: 0.51 mg/dL (ref 0.50–1.10)
GLUCOSE: 109 mg/dL — AB (ref 70–99)
Potassium: 4.3 mmol/L (ref 3.5–5.1)
SODIUM: 138 mmol/L (ref 135–145)
Total Protein: 6.3 g/dL (ref 6.0–8.3)

## 2015-03-25 LAB — TROPONIN I

## 2015-03-25 MED ORDER — ALBUTEROL SULFATE (2.5 MG/3ML) 0.083% IN NEBU
5.0000 mg | INHALATION_SOLUTION | Freq: Once | RESPIRATORY_TRACT | Status: AC
  Administered 2015-03-25: 5 mg via RESPIRATORY_TRACT
  Filled 2015-03-25: qty 6

## 2015-03-25 MED ORDER — ALBUTEROL SULFATE HFA 108 (90 BASE) MCG/ACT IN AERS
1.0000 | INHALATION_SPRAY | RESPIRATORY_TRACT | Status: DC | PRN
  Administered 2015-03-25: 2 via RESPIRATORY_TRACT
  Filled 2015-03-25: qty 6.7

## 2015-03-25 MED ORDER — PREDNISONE 50 MG PO TABS
60.0000 mg | ORAL_TABLET | Freq: Once | ORAL | Status: AC
  Administered 2015-03-25: 60 mg via ORAL
  Filled 2015-03-25 (×2): qty 1

## 2015-03-25 NOTE — ED Notes (Signed)
Dr Lita Mains at bedside,

## 2015-03-25 NOTE — ED Notes (Signed)
Lab at bedside for blood work.

## 2015-03-25 NOTE — ED Provider Notes (Signed)
CSN: [id number]     Arrival date & time 03/25/15  1940 History  This chart was scribed for Julianne Rice, MD by Irene Pap, ED Scribe. This patient was seen in room APA03/[facility]03 and patient care was started at 11:18 PM.     Chief Complaint  Patient presents with  . Pleurisy  . Shortness of Breath   Patient is a 54 y.o. female presenting with shortness of breath. The history is provided by the patient. No language interpreter was used.  Shortness of Breath Associated symptoms: cough and wheezing   Associated symptoms: no abdominal pain, no chest pain, no fever, no headaches, no neck pain, no rash, no sore throat and no vomiting     HPI Comments: Makayla Owens is a 54 y.o. female who presents to the Emergency Department complaining of bronchitis onset 3 days ago. She states that this may be attributed to allergies. She states that she was around burning material and this triggered a productive cough. She reports that she has been using cough medicine but is currently out of her inhaler. She reports associated weakness, SOB, chest tightness, wheezing, runny nose and body aches. She reports that these symptoms feel similar to her past experiences with bronchitis. She denies sore throat. No fever or chills. Chest tightness is worse with deep inspiration. No lower extremity swelling or pain.  Past Medical History  Diagnosis Date  . Diabetes mellitus   . Breast cancer   . Depression   . Migraines   . Gall bladder stones    Past Surgical History  Procedure Laterality Date  . Mastectomy      Bilateral  . Mastectomy Bilateral    Family History  Problem Relation Age of Onset  . Cancer Mother   . Heart failure Mother   . Diabetes Father   . Heart failure Father   . Cancer Father   . Stroke Father    History  Substance Use Topics  . Smoking status: Current Every Day Smoker -- 0.25 packs/day for 17 years    Types: Cigarettes  . Smokeless tobacco: Never Used  . Alcohol Use: No    OB History    Gravida Para Term Preterm AB TAB SAB Ectopic Multiple Living   2 1 1  1  1   1      Review of Systems  Constitutional: Negative for fever and chills.  HENT: Positive for congestion and rhinorrhea. Negative for sinus pressure and sore throat.   Respiratory: Positive for cough, chest tightness, shortness of breath and wheezing.   Cardiovascular: Negative for chest pain, palpitations and leg swelling.  Gastrointestinal: Negative for nausea, vomiting and abdominal pain.  Musculoskeletal: Negative for back pain, neck pain and neck stiffness.  Skin: Negative for rash and wound.  Neurological: Negative for dizziness, weakness, light-headedness, numbness and headaches.  All other systems reviewed and are negative.     Allergies  Nitrofurantoin; Elavil; Lipitor; Morphine and related; Other; and Statins  Home Medications   Prior to Admission medications   Medication Sig Start Date End Date Taking? Authorizing Provider  albuterol (PROVENTIL HFA;VENTOLIN HFA) 108 (90 BASE) MCG/ACT inhaler Inhale 1-2 puffs into the lungs every 6 (six) hours as needed for wheezing or shortness of breath.   Yes Historical Provider, MD  citalopram (CELEXA) 40 MG tablet Take 1 tablet (40 mg total) by mouth daily. 01/13/14  Yes Zenia Resides, MD  losartan (COZAAR) 50 MG tablet Take 1 tablet (50 mg total) by mouth daily. 11/12/14  Yes Zenia Resides, MD  metFORMIN (GLUCOPHAGE) 1000 MG tablet Take 1 tablet (1,000 mg total) by mouth 2 (two) times daily with a meal. 08/30/14  Yes Zenia Resides, MD  omeprazole (PRILOSEC) 20 MG capsule Take 1 capsule (20 mg total) by mouth 2 (two) times daily before a meal. 08/28/14  Yes Rolland Porter, MD  promethazine-codeine (PHENERGAN WITH CODEINE) 6.25-10 MG/5ML syrup Take 5 mLs by mouth every 6 (six) hours as needed for cough. 10/29/14  Yes Hope Bunnie Pion, NP  simvastatin (ZOCOR) 10 MG tablet Take 1 tablet (10 mg total) by mouth at bedtime. 08/02/14  Yes Zenia Resides,  MD  vitamin C (ASCORBIC ACID) 500 MG tablet Take 500 mg by mouth daily.   Yes Historical Provider, MD  acyclovir (ZOVIRAX) 800 MG tablet Take 1 tablet (800 mg total) by mouth 5 (five) times daily. Patient not taking: Reported on 03/25/2015 02/12/15   Rolland Porter, MD  benzonatate (TESSALON) 100 MG capsule Take 1 capsule (100 mg total) by mouth every 8 (eight) hours. 03/26/15   Julianne Rice, MD  ibuprofen (ADVIL,MOTRIN) 800 MG tablet Take 1 tablet (800 mg total) by mouth every 8 (eight) hours as needed. Patient not taking: Reported on 03/25/2015 01/19/15   Waldemar Dickens, MD  phenazopyridine (PYRIDIUM) 200 MG tablet Take 1 tablet (200 mg total) by mouth 3 (three) times daily. Patient not taking: Reported on 03/25/2015 10/29/14   Ashley Murrain, NP  predniSONE (DELTASONE) 20 MG tablet 3 tabs po day one, then 2 po daily x 4 days 03/26/15   Julianne Rice, MD   BP 117/89 mmHg  Pulse 81  Temp(Src) 97.1 F (36.2 C) (Oral)  Resp 20  Ht 5\' 6"  (1.676 m)  Wt 155 lb (70.308 kg)  BMI 25.03 kg/m2  SpO2 97% Physical Exam  Constitutional: She is oriented to person, place, and time. She appears well-developed and well-nourished. No distress.  Very well-appearing  HENT:  Head: Normocephalic and atraumatic.  Mouth/Throat: Oropharynx is clear and moist.  Eyes: EOM are normal. Pupils are equal, round, and reactive to light.  Neck: Normal range of motion. Neck supple.  Cardiovascular: Normal rate and regular rhythm.   Pulmonary/Chest: Effort normal. No respiratory distress. She has wheezes (expiratory wheezing in both lung fields.). She has no rales.  No respiratory distress.  Abdominal: Soft. Bowel sounds are normal.  Musculoskeletal: Normal range of motion. She exhibits no edema or tenderness.  No calf swelling or tenderness.  Neurological: She is alert and oriented to person, place, and time.  Moves all extremities without deficit. Sensation is grossly intact.  Skin: Skin is warm and dry. No rash noted.  No erythema.  Psychiatric: She has a normal mood and affect. Her behavior is normal.  Nursing note and vitals reviewed.   ED Course  Procedures (including critical care time) DIAGNOSTIC STUDIES: Oxygen Saturation is 94% on room air, adequate by my interpretation.    COORDINATION OF CARE: 11:19 PM-Discussed treatment plan which includes breathing treatment with pt at bedside and pt agreed to plan.   Labs Review Labs Reviewed  CBC WITH DIFFERENTIAL/PLATELET - Abnormal; Notable for the following:    Neutrophils Relative % 42 (*)    All other components within normal limits  COMPREHENSIVE METABOLIC PANEL - Abnormal; Notable for the following:    Glucose, Bld 109 (*)    All other components within normal limits  TROPONIN I    Imaging Review Dg Chest 2 View  03/25/2015  CLINICAL DATA:  Shortness of breath and central chest pain since Tuesday  EXAM: CHEST  2 VIEW  COMPARISON:  October 29, 2014  FINDINGS: The heart size and mediastinal contours are within normal limits. There is no focal infiltrate, pulmonary edema, or pleural effusion. The visualized skeletal structures are unremarkable.  IMPRESSION: No active cardiopulmonary disease.   Electronically Signed   By: Abelardo Diesel M.D.   On: 03/25/2015 20:52     EKG Interpretation   Date/Time:  Friday March 25 2015 20:04:53 EDT Ventricular Rate:  81 PR Interval:  140 QRS Duration: 82 QT Interval:  374 QTC Calculation: 434 R Axis:   35 Text Interpretation:  Normal sinus rhythm Normal ECG No significant change  was found Confirmed by Wyvonnia Dusky  MD, STEPHEN [phone]) on 03/25/2015 8:10:21  PM      MDM   Final diagnoses:  Bronchitis with bronchospasm   I personally performed the services described in this documentation, which was scribed in my presence. The recorded information has been reviewed and is accurate.  X-ray without evidence of pneumonia. Normal EKG. Chest tightness sounds related to her bronchospasm and not coronary  artery disease. She'll be given prednisone and albuterol nebulizers in the emergency department. Anticipate discharge home with inhaler.  Patient is feeling better after nebulized treatment. Lungs clear. Inhaler given in the emergency department. Return precautions also given.  Julianne Rice, MD 03/26/15 [phone]

## 2015-03-25 NOTE — ED Notes (Signed)
resp therapy called for breathing tx,

## 2015-03-25 NOTE — ED Notes (Signed)
Pt c/o sob, cough that started a few days ago after being outside around someone that was burning stuff outside, has hx of bronchitis, states that this feels the same, has been using cough medication and inhaler that was left over from previous visit but is out of the inhaler.

## 2015-03-25 NOTE — ED Notes (Signed)
Patient complaining of chest tightness, shortness of breath, cough. Patient states, "I think I have bronchitis again." Patient reports productive cough with dark beige mucus.

## 2015-03-26 MED ORDER — PREDNISONE 20 MG PO TABS: ORAL_TABLET | ORAL | Status: DC

## 2015-03-26 MED ORDER — BENZONATATE 100 MG PO CAPS: 100.0000 mg | ORAL_CAPSULE | Freq: Three times a day (TID) | ORAL | Status: DC

## 2015-03-26 NOTE — Discharge Instructions (Signed)
Bronchospasm °A bronchospasm is a spasm or tightening of the airways going into the lungs. During a bronchospasm breathing becomes more difficult because the airways get smaller. When this happens there can be coughing, a whistling sound when breathing (wheezing), and difficulty breathing. Bronchospasm is often associated with asthma, but not all patients who experience a bronchospasm have asthma. °CAUSES  °A bronchospasm is caused by inflammation or irritation of the airways. The inflammation or irritation may be triggered by:  °· Allergies (such as to animals, pollen, food, or mold). Allergens that cause bronchospasm may cause wheezing immediately after exposure or many hours later.   °· Infection. Viral infections are believed to be the most common cause of bronchospasm.   °· Exercise.   °· Irritants (such as pollution, cigarette smoke, strong odors, aerosol sprays, and paint fumes).   °· Weather changes. Winds increase molds and pollens in the air. Rain refreshes the air by washing irritants out. Cold air may cause inflammation.   °· Stress and emotional upset.   °SIGNS AND SYMPTOMS  °· Wheezing.   °· Excessive nighttime coughing.   °· Frequent or severe coughing with a simple cold.   °· Chest tightness.   °· Shortness of breath.   °DIAGNOSIS  °Bronchospasm is usually diagnosed through a history and physical exam. Tests, such as chest X-rays, are sometimes done to look for other conditions. °TREATMENT  °· Inhaled medicines can be given to open up your airways and help you breathe. The medicines can be given using either an inhaler or a nebulizer machine. °· Corticosteroid medicines may be given for severe bronchospasm, usually when it is associated with asthma. °HOME CARE INSTRUCTIONS  °· Always have a plan prepared for seeking medical care. Know when to call your health care provider and local emergency services (911 in the [place] Know where you can access local emergency care. °· Only take medicines as  directed by your health care provider. °· If you were prescribed an inhaler or nebulizer machine, ask your health care provider to explain how to use it correctly. Always use a spacer with your inhaler if you were given one. °· It is necessary to remain calm during an attack. Try to relax and breathe more slowly.  °· Control your home environment in the following ways:   °¨ Change your heating and air conditioning filter at least once a month.   °¨ Limit your use of fireplaces and wood stoves. °¨ Do not smoke and do not allow smoking in your home.   °¨ Avoid exposure to perfumes and fragrances.   °¨ Get rid of pests (such as roaches and mice) and their droppings.   °¨ Throw away plants if you see mold on them.   °¨ Keep your house clean and dust free.   °¨ Replace carpet with wood, tile, or vinyl flooring. Carpet can trap dander and dust.   °¨ Use allergy-proof pillows, mattress covers, and box spring covers.   °¨ Wash bed sheets and blankets every week in hot water and dry them in a dryer.   °¨ Use blankets that are made of polyester or cotton.   °¨ Wash hands frequently. °SEEK MEDICAL CARE IF:  °· You have muscle aches.   °· You have chest pain.   °· The sputum changes from clear or white to yellow, green, gray, or bloody.   °· The sputum you cough up gets thicker.   °· There are problems that may be related to the medicine you are given, such as a rash, itching, swelling, or trouble breathing.   °SEEK IMMEDIATE MEDICAL CARE IF:  °· You have worsening wheezing and coughing even   after taking your prescribed medicines.   °· You have increased difficulty breathing.   °· You develop severe chest pain. °MAKE SURE YOU:  °· Understand these instructions. °· Will watch your condition. °· Will get help right away if you are not doing well or get worse. °Document Released: 11/22/2003 Document Revised: 11/24/2013 Document Reviewed: 05/11/2013 °Exit[employer]® Patient Information ©2015 [employer]. This information is not  intended to replace advice given to you by your health care provider. Make sure you discuss any questions you have with your health care provider. ° °

## 2015-03-29 ENCOUNTER — Other Ambulatory Visit: Payer: Self-pay | Admitting: Family Medicine

## 2015-06-03 ENCOUNTER — Encounter: Payer: Self-pay | Admitting: Family Medicine

## 2015-06-03 ENCOUNTER — Ambulatory Visit (INDEPENDENT_AMBULATORY_CARE_PROVIDER_SITE_OTHER): Payer: Self-pay | Admitting: Family Medicine

## 2015-06-03 VITALS — BP 129/74 | HR 84 | Temp 97.7°F | Ht 66.0 in | Wt 150.6 lb

## 2015-06-03 DIAGNOSIS — Z1159 Encounter for screening for other viral diseases: Secondary | ICD-10-CM | POA: Insufficient documentation

## 2015-06-03 DIAGNOSIS — M797 Fibromyalgia: Secondary | ICD-10-CM

## 2015-06-03 DIAGNOSIS — R350 Frequency of micturition: Secondary | ICD-10-CM

## 2015-06-03 DIAGNOSIS — G5601 Carpal tunnel syndrome, right upper limb: Secondary | ICD-10-CM

## 2015-06-03 DIAGNOSIS — K8020 Calculus of gallbladder without cholecystitis without obstruction: Secondary | ICD-10-CM

## 2015-06-03 DIAGNOSIS — Z1211 Encounter for screening for malignant neoplasm of colon: Secondary | ICD-10-CM

## 2015-06-03 DIAGNOSIS — E119 Type 2 diabetes mellitus without complications: Secondary | ICD-10-CM

## 2015-06-03 DIAGNOSIS — Z87891 Personal history of nicotine dependence: Secondary | ICD-10-CM

## 2015-06-03 DIAGNOSIS — Z853 Personal history of malignant neoplasm of breast: Secondary | ICD-10-CM

## 2015-06-03 DIAGNOSIS — Z720 Tobacco use: Secondary | ICD-10-CM

## 2015-06-03 LAB — POCT URINALYSIS DIPSTICK
BILIRUBIN UA: NEGATIVE
Blood, UA: NEGATIVE
GLUCOSE UA: NEGATIVE
KETONES UA: NEGATIVE
Leukocytes, UA: NEGATIVE
Nitrite, UA: NEGATIVE
Protein, UA: NEGATIVE
SPEC GRAV UA: 1.01
Urobilinogen, UA: 0.2
pH, UA: 7

## 2015-06-03 LAB — POCT GLYCOSYLATED HEMOGLOBIN (HGB A1C): HEMOGLOBIN A1C: 6.6

## 2015-06-03 MED ORDER — GABAPENTIN 100 MG PO CAPS: 100.0000 mg | ORAL_CAPSULE | Freq: Three times a day (TID) | ORAL | Status: DC

## 2015-06-03 NOTE — Assessment & Plan Note (Signed)
Injected again

## 2015-06-03 NOTE — Progress Notes (Signed)
   Subjective:    Patient ID: Makayla Owens, female    DOB: Oct 01, 1961, 54 y.o.   MRN: [id number]  HPI Multiple issues: Overdue for A1C - fortunately at goal. Weight loss is great - unfortunately due to recurrent GB pains. Known cholelithiasis.  Cannot afford elective cholecystectomy Quit smoking - great! Pain all over: low back neck right thigh.   Right carpal tunnel - bothering again.  Great response to previous steroid injection.   Some dysuria, hx of recurrent UTI. Behind on health maint.  Will do hemocults rather than colonscopy, which she cannot afford.    Review of Systems     Objective:   Physical ExamLungs clear Cardiac RRR without m or g Abd benign.   Rt. Hand + carpal tunnel testing.  Injection of xylocaine and medrol given  Patient tolerated well. UA not infected.        Assessment & Plan:

## 2015-06-03 NOTE — Patient Instructions (Signed)
Come back this fall for a flu shot, pap test and cholesterol and other things. Try the gabapentin for nerve pain.

## 2015-06-03 NOTE — Assessment & Plan Note (Signed)
Trial of gabapentin

## 2015-06-03 NOTE — Assessment & Plan Note (Addendum)
Reinforced continued abstinence. [employer]

## 2015-06-03 NOTE — Assessment & Plan Note (Signed)
S/P bilateral mastectomy, no need for mammo.

## 2015-06-03 NOTE — Assessment & Plan Note (Signed)
Recurrent symptoms.  Await insurance coverage.

## 2015-06-17 ENCOUNTER — Ambulatory Visit: Payer: Self-pay | Admitting: Family Medicine

## 2015-07-22 ENCOUNTER — Other Ambulatory Visit: Payer: Self-pay | Admitting: Family Medicine

## 2015-08-17 ENCOUNTER — Encounter (HOSPITAL_COMMUNITY): Payer: Self-pay | Admitting: Emergency Medicine

## 2015-08-17 ENCOUNTER — Emergency Department (HOSPITAL_COMMUNITY)
Admission: EM | Admit: 2015-08-17 | Discharge: 2015-08-17 | Disposition: A | Discharge status: Home / Self Care | Payer: Self-pay | Attending: Emergency Medicine | Admitting: Emergency Medicine

## 2015-08-17 DIAGNOSIS — Z79899 Other long term (current) drug therapy: Secondary | ICD-10-CM | POA: Insufficient documentation

## 2015-08-17 DIAGNOSIS — F329 Major depressive disorder, single episode, unspecified: Secondary | ICD-10-CM | POA: Insufficient documentation

## 2015-08-17 DIAGNOSIS — R319 Hematuria, unspecified: Secondary | ICD-10-CM | POA: Insufficient documentation

## 2015-08-17 DIAGNOSIS — Z87891 Personal history of nicotine dependence: Secondary | ICD-10-CM | POA: Insufficient documentation

## 2015-08-17 DIAGNOSIS — Z8719 Personal history of other diseases of the digestive system: Secondary | ICD-10-CM | POA: Insufficient documentation

## 2015-08-17 DIAGNOSIS — E119 Type 2 diabetes mellitus without complications: Secondary | ICD-10-CM | POA: Insufficient documentation

## 2015-08-17 DIAGNOSIS — G43909 Migraine, unspecified, not intractable, without status migrainosus: Secondary | ICD-10-CM | POA: Insufficient documentation

## 2015-08-17 DIAGNOSIS — N390 Urinary tract infection, site not specified: Secondary | ICD-10-CM | POA: Insufficient documentation

## 2015-08-17 DIAGNOSIS — Z853 Personal history of malignant neoplasm of breast: Secondary | ICD-10-CM | POA: Insufficient documentation

## 2015-08-17 LAB — URINE MICROSCOPIC-ADD ON

## 2015-08-17 LAB — URINALYSIS, ROUTINE W REFLEX MICROSCOPIC
Bilirubin Urine: NEGATIVE
Glucose, UA: NEGATIVE mg/dL
Ketones, ur: NEGATIVE mg/dL
NITRITE: NEGATIVE
Protein, ur: NEGATIVE mg/dL
SPECIFIC GRAVITY, URINE: 1.02 (ref 1.005–1.030)
Urobilinogen, UA: 0.2 mg/dL (ref 0.0–1.0)
pH: 6 (ref 5.0–8.0)

## 2015-08-17 MED ORDER — PHENAZOPYRIDINE HCL 100 MG PO TABS
100.0000 mg | ORAL_TABLET | Freq: Once | ORAL | Status: AC
  Administered 2015-08-17: 100 mg via ORAL
  Filled 2015-08-17: qty 1

## 2015-08-17 MED ORDER — PHENAZOPYRIDINE HCL 200 MG PO TABS: 200.0000 mg | ORAL_TABLET | Freq: Three times a day (TID) | ORAL | Status: DC | PRN

## 2015-08-17 MED ORDER — CIPROFLOXACIN HCL 250 MG PO TABS
500.0000 mg | ORAL_TABLET | Freq: Once | ORAL | Status: AC
  Administered 2015-08-17: 500 mg via ORAL
  Filled 2015-08-17: qty 2

## 2015-08-17 MED ORDER — CIPROFLOXACIN HCL 500 MG PO TABS: 500.0000 mg | ORAL_TABLET | Freq: Two times a day (BID) | ORAL | Status: DC

## 2015-08-17 NOTE — ED Notes (Signed)
Pt c/o lower abd pain, chills, dysuria, and frequency since last night.

## 2015-08-17 NOTE — ED Notes (Signed)
Patient verbalizes understanding of discharge instructions, prescription medications, home care and follow up care. Patient ambulatory out of department at this time. 

## 2015-08-17 NOTE — Discharge Instructions (Signed)

## 2015-08-17 NOTE — ED Provider Notes (Signed)
TIME SEEN: 3:00 AM  CHIEF COMPLAINT: Dysuria, urinary frequency  HPI: Pt is a 54 y.o. female with a history of breast cancer status post mastectomy who is in remission, diabetes, frequent UTIs who presents to the emergency department with dysuria, urinary frequency, suprapubic pressure that started at 1 AM. Feels like her previous UTIs. No vaginal bleeding or discharge. No nausea, vomiting or diarrhea. Has had chills but no fever. States that Cipro normally helps with her UTIs.  ROS: See HPI Constitutional: no fever  Eyes: no drainage  ENT: no runny nose   Cardiovascular:  no chest pain  Resp: no SOB  GI: no vomiting GU: no dysuria Integumentary: no rash  Allergy: no hives  Musculoskeletal: no leg swelling  Neurological: no slurred speech ROS otherwise negative  PAST MEDICAL HISTORY/PAST SURGICAL HISTORY:  Past Medical History  Diagnosis Date  . Diabetes mellitus   . Breast cancer   . Depression   . Migraines   . Gall bladder stones     MEDICATIONS:  Prior to Admission medications   Medication Sig Start Date End Date Taking? Authorizing Provider  albuterol (PROVENTIL HFA;VENTOLIN HFA) 108 (90 BASE) MCG/ACT inhaler Inhale 1-2 puffs into the lungs every 6 (six) hours as needed for wheezing or shortness of breath.   Yes Historical Provider, MD  citalopram (CELEXA) 40 MG tablet TAKE 1 TABLET BY MOUTH DAILY 07/25/15  Yes Zenia Resides, MD  gabapentin (NEURONTIN) 100 MG capsule Take 1 capsule (100 mg total) by mouth 3 (three) times daily. 06/03/15  Yes Zenia Resides, MD  ibuprofen (ADVIL,MOTRIN) 800 MG tablet Take 1 tablet (800 mg total) by mouth every 8 (eight) hours as needed. 01/19/15  Yes Waldemar Dickens, MD  losartan (COZAAR) 50 MG tablet Take 1 tablet (50 mg total) by mouth daily. 11/12/14  Yes Zenia Resides, MD  metFORMIN (GLUCOPHAGE) 1000 MG tablet Take 1 tablet (1,000 mg total) by mouth 2 (two) times daily with a meal. 08/30/14  Yes Zenia Resides, MD  simvastatin  (ZOCOR) 10 MG tablet Take 1 tablet (10 mg total) by mouth at bedtime. 08/02/14  Yes Zenia Resides, MD  vitamin C (ASCORBIC ACID) 500 MG tablet Take 500 mg by mouth daily.   Yes Historical Provider, MD    ALLERGIES:  Allergies  Allergen Reactions  . Nitrofurantoin Itching and Rash  . Elavil [Amitriptyline]     Felt bad and seemed to cause UTIs  . Lipitor [Atorvastatin]     Muscle pain and nausea  . Morphine And Related Other (See Comments)    Does not like side effects  . Other     Pt does not want narcotics  . Statins     Body aches    SOCIAL HISTORY:  Social History  Substance Use Topics  . Smoking status: Former Smoker -- 0.25 packs/day for 17 years    Types: Cigarettes    Quit date: 03/04/2015  . Smokeless tobacco: Never Used  . Alcohol Use: No    FAMILY HISTORY: Family History  Problem Relation Age of Onset  . Cancer Mother   . Heart failure Mother   . Diabetes Father   . Heart failure Father   . Cancer Father   . Stroke Father     EXAM: BP 117/86 mmHg  Pulse 84  Temp(Src) 97.8 F (36.6 C)  Resp 18  Ht 5\' 6"  (1.676 m)  Wt 148 lb (67.132 kg)  BMI 23.90 kg/m2  SpO2 96% CONSTITUTIONAL: Alert  and oriented and responds appropriately to questions. Well-appearing; well-nourished, afebrile, nontoxic HEAD: Normocephalic EYES: Conjunctivae clear, PERRL ENT: normal nose; no rhinorrhea; moist mucous membranes; pharynx without lesions noted NECK: Supple, no meningismus, no LAD  CARD: RRR; S1 and S2 appreciated; no murmurs, no clicks, no rubs, no gallops RESP: Normal chest excursion without splinting or tachypnea; breath sounds clear and equal bilaterally; no wheezes, no rhonchi, no rales, no hypoxia or respiratory distress, speaking full sentences ABD/GI: Normal bowel sounds; non-distended; soft, non-tender, no rebound, no guarding, no peritoneal signs BACK:  The back appears normal and is non-tender to palpation, there is no CVA tenderness EXT: Normal ROM in all  joints; non-tender to palpation; no edema; normal capillary refill; no cyanosis, no calf tenderness or swelling    SKIN: Normal color for age and race; warm NEURO: Moves all extremities equally, sensation to light touch intact diffusely, cranial nerves II through XII intact PSYCH: The patient's mood and manner are appropriate. Grooming and personal hygiene are appropriate.  MEDICAL DECISION MAKING: Patient here with symptoms similar to her previous UTIs. Urine shows hemoglobin, to numerous to count WBCs, many bacteria but also many squamous cells. Culture is pending. Given she is symptomatic will treat with Cipro 500 mg twice a day for 1 week. Will also discharge with Pyridium. I feel she is safe to be discharged home. She is heme a dynamically stable, afebrile, nontoxic and appear well-hydrated. Abdominal exam is benign. I do not feel she needs further emergent workup at this time. Discussed return precautions. She verbalizes understanding and is covered with this plan.       Jeffers, DO 08/17/15 [phone]

## 2015-08-18 LAB — URINE CULTURE

## 2015-09-06 ENCOUNTER — Other Ambulatory Visit: Payer: Self-pay | Admitting: Family Medicine

## 2015-09-15 ENCOUNTER — Ambulatory Visit: Payer: Self-pay | Admitting: Family Medicine

## 2015-09-28 ENCOUNTER — Ambulatory Visit: Payer: Self-pay | Admitting: Family Medicine

## 2015-09-28 ENCOUNTER — Ambulatory Visit (INDEPENDENT_AMBULATORY_CARE_PROVIDER_SITE_OTHER): Payer: Self-pay | Admitting: Family Medicine

## 2015-09-28 ENCOUNTER — Other Ambulatory Visit (HOSPITAL_COMMUNITY)
Admission: RE | Admit: 2015-09-28 | Discharge: 2015-09-28 | Disposition: A | Discharge status: Home / Self Care | Payer: Self-pay | Source: Ambulatory Visit | Attending: Family Medicine | Admitting: Family Medicine

## 2015-09-28 VITALS — BP 110/60 | HR 77 | Temp 98.4°F | Wt 146.7 lb

## 2015-09-28 DIAGNOSIS — Z124 Encounter for screening for malignant neoplasm of cervix: Secondary | ICD-10-CM

## 2015-09-28 DIAGNOSIS — E1122 Type 2 diabetes mellitus with diabetic chronic kidney disease: Secondary | ICD-10-CM

## 2015-09-28 DIAGNOSIS — Z1211 Encounter for screening for malignant neoplasm of colon: Secondary | ICD-10-CM

## 2015-09-28 DIAGNOSIS — E119 Type 2 diabetes mellitus without complications: Secondary | ICD-10-CM

## 2015-09-28 DIAGNOSIS — N181 Chronic kidney disease, stage 1: Secondary | ICD-10-CM

## 2015-09-28 DIAGNOSIS — C50919 Malignant neoplasm of unspecified site of unspecified female breast: Secondary | ICD-10-CM

## 2015-09-28 DIAGNOSIS — Z01419 Encounter for gynecological examination (general) (routine) without abnormal findings: Secondary | ICD-10-CM | POA: Insufficient documentation

## 2015-09-28 DIAGNOSIS — M797 Fibromyalgia: Secondary | ICD-10-CM

## 2015-09-28 DIAGNOSIS — K8020 Calculus of gallbladder without cholecystitis without obstruction: Secondary | ICD-10-CM

## 2015-09-28 DIAGNOSIS — E7800 Pure hypercholesterolemia, unspecified: Secondary | ICD-10-CM

## 2015-09-28 LAB — CBC
HEMATOCRIT: 43 % (ref 36.0–46.0)
Hemoglobin: 15.4 g/dL — ABNORMAL HIGH (ref 12.0–15.0)
MCH: 32.2 pg (ref 26.0–34.0)
MCHC: 35.8 g/dL (ref 30.0–36.0)
MCV: 89.8 fL (ref 78.0–100.0)
MPV: 9.6 fL (ref 8.6–12.4)
Platelets: 221 10*3/uL (ref 150–400)
RBC: 4.79 MIL/uL (ref 3.87–5.11)
RDW: 13.8 % (ref 11.5–15.5)
WBC: 9.2 10*3/uL (ref 4.0–10.5)

## 2015-09-28 LAB — COMPREHENSIVE METABOLIC PANEL
ALBUMIN: 4 g/dL (ref 3.6–5.1)
ALK PHOS: 76 U/L (ref 33–130)
ALT: 12 U/L (ref 6–29)
AST: 12 U/L (ref 10–35)
BUN: 8 mg/dL (ref 7–25)
CALCIUM: 9.5 mg/dL (ref 8.6–10.4)
CHLORIDE: 105 mmol/L (ref 98–110)
CO2: 26 mmol/L (ref 20–31)
Creat: 0.45 mg/dL — ABNORMAL LOW (ref 0.50–1.05)
Glucose, Bld: 141 mg/dL — ABNORMAL HIGH (ref 65–99)
POTASSIUM: 3.8 mmol/L (ref 3.5–5.3)
Sodium: 141 mmol/L (ref 135–146)
TOTAL PROTEIN: 5.9 g/dL — AB (ref 6.1–8.1)
Total Bilirubin: 0.3 mg/dL (ref 0.2–1.2)

## 2015-09-28 LAB — LIPID PANEL
CHOLESTEROL: 204 mg/dL — AB (ref 125–200)
HDL: 49 mg/dL (ref >=46)
LDL CALC: 134 mg/dL — AB (ref <130)
TRIGLYCERIDES: 105 mg/dL (ref <150)
Total CHOL/HDL Ratio: 4.2 Ratio (ref <=5.0)
VLDL: 21 mg/dL (ref <30)

## 2015-09-28 LAB — POCT GLYCOSYLATED HEMOGLOBIN (HGB A1C): HEMOGLOBIN A1C: 6.7

## 2015-09-28 MED ORDER — GABAPENTIN 300 MG PO CAPS: 300.0000 mg | ORAL_CAPSULE | Freq: Three times a day (TID) | ORAL | Status: DC

## 2015-09-28 NOTE — Assessment & Plan Note (Addendum)
Stop NSAIDs.  Cont losartin.  Check creat.

## 2015-09-28 NOTE — Assessment & Plan Note (Signed)
>>  ASSESSMENT AND PLAN FOR DM (DIABETES MELLITUS), TYPE 2 WITH RENAL COMPLICATIONS ([facility]) WRITTEN ON 09/29/2015  4:24 PM BY HENSEL, Santiago Bumpers, MD  Stop NSAIDs.  Cont losartin.  Check creat.

## 2015-09-28 NOTE — Patient Instructions (Signed)
No more ibuprofen/NSAIDs.  Use tylenol for pain. I will call with lab results. I sent a high dose of gabapentin in for you. Call me when you get your flu shot to update records.

## 2015-09-28 NOTE — Progress Notes (Signed)
   Subjective:    Patient ID: Makayla Owens, female    DOB: [birth year], 54 y.o.   MRN: [id number]  HPI Lots of issues.  Finances continue to be a problem  No insurance.  Wants limited intervention.  Issues 1. Still with gallstones.  Intermitant biliary colic.  No further pancreatitis.  Cannot afford surgery 2. DM still well controled on metformin.  A1C at goal. 3. DM nephropathy microalbumin + On losartin 4. DM neuropathy.  Tingling of bottoms of both feet, gabapentin helps 5. Fibromyalgia Wants to up gabapentin.  Advised against NSAIDs. 6. Needs flu shot - will get elsewhere due to cheaper cost. 7. Still needs to complete hemocults. 8. Cannot afford eye exam. 9. Breast cancer in remission.  Not followed by onc due to finances.  Now 7 years out from last chemo.  S/P bilateral mastectomy.  They did not seem to follow tumor markers, Just CBC, CMP and CXR.  No symptoms.  Reviewed CXR done this year by ER. 10.  Has had slow, controled wt loss.   11. Has been off statin because she thinks it contributes to her pain.     Review of SystemsDenies CP, SOB, bowel changes, worrisome skin lesions.     Objective:   Physical ExamHEENT normal Neck supple Lungs clear Cardiac RRR without M or g Abd benign Pelvic WNL, pap taken Ext WNL Diabetic foot exam done        Assessment & Plan:

## 2015-09-28 NOTE — Assessment & Plan Note (Signed)
CXR OK 03/2015.  Check labs

## 2015-09-29 ENCOUNTER — Encounter: Payer: Self-pay | Admitting: Family Medicine

## 2015-09-29 NOTE — Assessment & Plan Note (Signed)
She has hemocult cards.  Encouraged to complete and mail in.

## 2015-09-29 NOTE — Assessment & Plan Note (Signed)
Continue losartin and check creat

## 2015-09-29 NOTE — Assessment & Plan Note (Addendum)
Recheck.  Has not been taking statin. Informed of lab results.  Nice drop with weight loss.  Recommended pravastatin,  She declined.

## 2015-09-29 NOTE — Assessment & Plan Note (Signed)
Increase gabapentin  

## 2015-09-29 NOTE — Assessment & Plan Note (Signed)
Intermitantly symptomatic with biliary colic.  Still cannot afford surgery and no insurance.

## 2015-10-04 LAB — CYTOLOGY - PAP

## 2015-11-25 ENCOUNTER — Emergency Department (HOSPITAL_COMMUNITY): Payer: Self-pay

## 2015-11-25 ENCOUNTER — Emergency Department (HOSPITAL_COMMUNITY)
Admission: EM | Admit: 2015-11-25 | Discharge: 2015-11-25 | Disposition: A | Discharge status: Home / Self Care | Payer: Self-pay | Attending: Emergency Medicine | Admitting: Emergency Medicine

## 2015-11-25 ENCOUNTER — Encounter (HOSPITAL_COMMUNITY): Payer: Self-pay

## 2015-11-25 DIAGNOSIS — Z853 Personal history of malignant neoplasm of breast: Secondary | ICD-10-CM | POA: Insufficient documentation

## 2015-11-25 DIAGNOSIS — G43909 Migraine, unspecified, not intractable, without status migrainosus: Secondary | ICD-10-CM | POA: Insufficient documentation

## 2015-11-25 DIAGNOSIS — R5383 Other fatigue: Secondary | ICD-10-CM | POA: Insufficient documentation

## 2015-11-25 DIAGNOSIS — E119 Type 2 diabetes mellitus without complications: Secondary | ICD-10-CM | POA: Insufficient documentation

## 2015-11-25 DIAGNOSIS — R112 Nausea with vomiting, unspecified: Secondary | ICD-10-CM | POA: Insufficient documentation

## 2015-11-25 DIAGNOSIS — Z8719 Personal history of other diseases of the digestive system: Secondary | ICD-10-CM | POA: Insufficient documentation

## 2015-11-25 DIAGNOSIS — Z79899 Other long term (current) drug therapy: Secondary | ICD-10-CM | POA: Insufficient documentation

## 2015-11-25 DIAGNOSIS — Z7984 Long term (current) use of oral hypoglycemic drugs: Secondary | ICD-10-CM | POA: Insufficient documentation

## 2015-11-25 DIAGNOSIS — R079 Chest pain, unspecified: Secondary | ICD-10-CM | POA: Insufficient documentation

## 2015-11-25 DIAGNOSIS — R1013 Epigastric pain: Secondary | ICD-10-CM

## 2015-11-25 DIAGNOSIS — Z87891 Personal history of nicotine dependence: Secondary | ICD-10-CM | POA: Insufficient documentation

## 2015-11-25 DIAGNOSIS — R143 Flatulence: Secondary | ICD-10-CM | POA: Insufficient documentation

## 2015-11-25 LAB — CBC
HCT: 45.3 % (ref 36.0–46.0)
Hemoglobin: 15.9 g/dL — ABNORMAL HIGH (ref 12.0–15.0)
MCH: 32.5 pg (ref 26.0–34.0)
MCHC: 35.1 g/dL (ref 30.0–36.0)
MCV: 92.6 fL (ref 78.0–100.0)
PLATELETS: 209 10*3/uL (ref 150–400)
RBC: 4.89 MIL/uL (ref 3.87–5.11)
RDW: 13.6 % (ref 11.5–15.5)
WBC: 9.7 10*3/uL (ref 4.0–10.5)

## 2015-11-25 LAB — LIPASE, BLOOD: LIPASE: 40 U/L (ref 11–51)

## 2015-11-25 LAB — BASIC METABOLIC PANEL
Anion gap: 9 (ref 5–15)
BUN: 14 mg/dL (ref 6–20)
CALCIUM: 9 mg/dL (ref 8.9–10.3)
CHLORIDE: 100 mmol/L — AB (ref 101–111)
CO2: 26 mmol/L (ref 22–32)
CREATININE: 0.56 mg/dL (ref 0.44–1.00)
GFR calc non Af Amer: >60 mL/min (ref >60)
Glucose, Bld: 122 mg/dL — ABNORMAL HIGH (ref 65–99)
Potassium: 3.6 mmol/L (ref 3.5–5.1)
SODIUM: 135 mmol/L (ref 135–145)

## 2015-11-25 LAB — HEPATIC FUNCTION PANEL
ALT: 15 U/L (ref 14–54)
AST: 14 U/L — ABNORMAL LOW (ref 15–41)
Albumin: 4 g/dL (ref 3.5–5.0)
Alkaline Phosphatase: 73 U/L (ref 38–126)
BILIRUBIN DIRECT: 0.1 mg/dL (ref 0.1–0.5)
BILIRUBIN INDIRECT: 0.6 mg/dL (ref 0.3–0.9)
BILIRUBIN TOTAL: 0.7 mg/dL (ref 0.3–1.2)
Total Protein: 6.5 g/dL (ref 6.5–8.1)

## 2015-11-25 LAB — TROPONIN I: Troponin I: <0.03 ng/mL (ref <0.031)

## 2015-11-25 MED ORDER — RANITIDINE HCL 150 MG/10ML PO SYRP
150.0000 mg | ORAL_SOLUTION | Freq: Once | ORAL | Status: AC
  Administered 2015-11-25: 150 mg via ORAL
  Filled 2015-11-25: qty 10

## 2015-11-25 MED ORDER — IOHEXOL 300 MG/ML  SOLN
100.0000 mL | Freq: Once | INTRAMUSCULAR | Status: AC | PRN
  Administered 2015-11-25: 100 mL via INTRAVENOUS

## 2015-11-25 MED ORDER — FENTANYL CITRATE (PF) 100 MCG/2ML IJ SOLN
100.0000 ug | Freq: Once | INTRAMUSCULAR | Status: AC
  Administered 2015-11-25: 100 ug via INTRAVENOUS
  Filled 2015-11-25: qty 2

## 2015-11-25 MED ORDER — SODIUM CHLORIDE 0.9 % IV BOLUS (SEPSIS)
1000.0000 mL | Freq: Once | INTRAVENOUS | Status: AC
  Administered 2015-11-25: 1000 mL via INTRAVENOUS

## 2015-11-25 MED ORDER — IOHEXOL 300 MG/ML  SOLN
25.0000 mL | Freq: Once | INTRAMUSCULAR | Status: AC | PRN
  Administered 2015-11-25: 25 mL via ORAL

## 2015-11-25 MED ORDER — GI COCKTAIL ~~LOC~~
30.0000 mL | Freq: Once | ORAL | Status: AC
  Administered 2015-11-25: 30 mL via ORAL
  Filled 2015-11-25: qty 30

## 2015-11-25 NOTE — ED Notes (Signed)
Pt given dt coke and peanut butter crackers per request.

## 2015-11-25 NOTE — ED Notes (Signed)
Patient states central chest burning with radiation into back with SOB, nausea and vomiting X1 week. Patient also states "im having a gallbladder attack, and I cant keep anything down"

## 2015-11-25 NOTE — ED Provider Notes (Signed)
CSN: [id number]     Arrival date & time 11/25/15  [id number] History   First MD Initiated Contact with Patient 11/25/15 [phone]     Chief Complaint  Patient presents with  . Chest Pain     (Consider location/radiation/quality/duration/timing/severity/associated sxs/prior Treatment) Patient is a 54 y.o. female presenting with abdominal pain.  Abdominal Pain Pain location:  Epigastric Pain quality: burning and sharp   Pain severity:  No pain Onset quality:  Gradual Duration:  1 week Timing:  Constant Progression:  Waxing and waning Chronicity:  Recurrent Context: not alcohol use, not diet changes, not eating, not recent illness and not sick contacts   Relieved by:  None tried Worsened by:  Nothing tried Ineffective treatments:  None tried Associated symptoms: chest pain, fatigue, flatus, nausea and vomiting   Associated symptoms: no anorexia, no chills, no cough, no diarrhea, no dysuria, no fever and no shortness of breath     Past Medical History  Diagnosis Date  . Diabetes mellitus   . Breast cancer ([place])   . Depression   . Migraines   . Gall bladder stones    Past Surgical History  Procedure Laterality Date  . Mastectomy      Bilateral  . Mastectomy Bilateral   . Breast surgery     Family History  Problem Relation Age of Onset  . Cancer Mother   . Heart failure Mother   . Diabetes Father   . Heart failure Father   . Cancer Father   . Stroke Father    Social History  Substance Use Topics  . Smoking status: Former Smoker -- 0.25 packs/day for 17 years    Types: Cigarettes    Quit date: 03/04/2015  . Smokeless tobacco: Never Used  . Alcohol Use: No   OB History    Gravida Para Term Preterm AB TAB SAB Ectopic Multiple Living   2 1 1  1  1   1      Review of Systems  Constitutional: Positive for fatigue. Negative for fever and chills.  Respiratory: Negative for cough and shortness of breath.   Cardiovascular: Positive for chest pain.  Gastrointestinal: Positive  for nausea, vomiting, abdominal pain and flatus. Negative for diarrhea and anorexia.  Endocrine: Negative for polydipsia and polyuria.  Genitourinary: Negative for dysuria.  All other systems reviewed and are negative.     Allergies  Nitrofurantoin; Elavil; Lipitor; Morphine and related; Nsaids; Other; and Statins  Home Medications   Prior to Admission medications   Medication Sig Start Date End Date Taking? Authorizing Provider  citalopram (CELEXA) 40 MG tablet TAKE 1 TABLET BY MOUTH DAILY 07/25/15  Yes Zenia Resides, MD  gabapentin (NEURONTIN) 300 MG capsule Take 1 capsule (300 mg total) by mouth 3 (three) times daily. Patient taking differently: Take 300 mg by mouth 3 (three) times daily as needed (pain).  09/28/15  Yes Zenia Resides, MD  losartan (COZAAR) 50 MG tablet Take 1 tablet (50 mg total) by mouth daily. 11/12/14  Yes Zenia Resides, MD  metFORMIN (GLUCOPHAGE) 1000 MG tablet TAKE 1 BY MOUTH TWICE DAILY WITH A MEAL 09/06/15  Yes Zenia Resides, MD  Multiple Vitamin (MULTIVITAMIN WITH MINERALS) TABS tablet Take 1 tablet by mouth daily.   Yes Historical Provider, MD  vitamin C (ASCORBIC ACID) 500 MG tablet Take 500 mg by mouth daily.   Yes Historical Provider, MD   BP 120/72 mmHg  Pulse 71  Temp(Src) 98.4 F (36.9 C) (Oral)  Resp  15  Ht 5' 6.5" (1.689 m)  Wt 145 lb (65.772 kg)  BMI 23.06 kg/m2  SpO2 96% Physical Exam  Constitutional: She is oriented to person, place, and time. She appears well-developed and well-nourished.  HENT:  Head: Normocephalic and atraumatic.  Eyes: Conjunctivae are normal. Pupils are equal, round, and reactive to light.  Neck: Normal range of motion.  Cardiovascular: Normal rate and regular rhythm.   Pulmonary/Chest: Effort normal and breath sounds normal. No stridor. No respiratory distress. She has no wheezes.  Abdominal: She exhibits no distension. There is tenderness (epigastric area). There is no rebound.  Musculoskeletal: Normal  range of motion. She exhibits no edema or tenderness.  Neurological: She is alert and oriented to person, place, and time.  Skin: Skin is warm and dry. No rash noted. No erythema. No pallor.  Psychiatric: She has a normal mood and affect. Her behavior is normal.  Nursing note and vitals reviewed.   ED Course  Procedures (including critical care time) Labs Review Labs Reviewed  BASIC METABOLIC PANEL - Abnormal; Notable for the following:    Chloride 100 (*)    Glucose, Bld 122 (*)    All other components within normal limits  CBC - Abnormal; Notable for the following:    Hemoglobin 15.9 (*)    All other components within normal limits  HEPATIC FUNCTION PANEL - Abnormal; Notable for the following:    AST 14 (*)    All other components within normal limits  TROPONIN I  LIPASE, BLOOD  TROPONIN I    Imaging Review Dg Chest 2 View  11/25/2015  CLINICAL DATA:  Chest pain for 1 day EXAM: CHEST  2 VIEW COMPARISON:  March 25, 2015 FINDINGS: Lungs are clear. Heart size and pulmonary vascularity are normal. No adenopathy. No bone lesions. No pneumothorax. IMPRESSION: No edema or consolidation. Electronically Signed   By: Lowella Grip III M.D.   On: 11/25/2015 07:55   Ct Abdomen Pelvis W Contrast  11/25/2015  CLINICAL DATA:  One week history of burning type epigastric pain with nausea and vomiting. History of breast carcinoma EXAM: CT ABDOMEN AND PELVIS WITH CONTRAST TECHNIQUE: Multidetector CT imaging of the abdomen and pelvis was performed using the standard protocol following bolus administration of intravenous contrast. Oral contrast was also administered. CONTRAST:  15mL OMNIPAQUE IOHEXOL 300 MG/ML SOLN, 115mL OMNIPAQUE IOHEXOL 300 MG/ML SOLN COMPARISON:  Abdominal ultrasound August 28, 2014 FINDINGS: Lower chest: There is slight atelectasis in the posterior left lung base. Lung bases are otherwise clear. There are breast implants bilaterally. Patient status post bilateral  mastectomies. Hepatobiliary: Liver is prominent, measuring 19.4 cm in length. 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 There is a 5 x 4 mm probable cyst in the inferior posterior segment right lobe liver. No other focal liver lesions are identified. Note that there is mild fatty infiltration near the fissure for the ligamentum teres. There is cholelithiasis. The gallbladder wall does not appear appreciably thickened by CT. There is no biliary duct dilatation. Pancreas: There is no demonstrable pancreatic mass or inflammatory focus. No peripancreatic mesenteric stranding or fluid. No pancreatic duct dilatation is appreciable. Spleen: No splenic lesions are identified. Adrenals/Urinary Tract: Right adrenal appears normal. There is a left adrenal adenoma measuring 2.2 x 1.8 cm. Kidneys bilaterally show no evidence of mass or hydronephrosis on either side. There is no renal or ureteral calculus on either side. Urinary bladder is midline with wall thickness within normal limits. Several phleboliths are near but  separate from the distal ureters. Stomach/Bowel: There is no demonstrable bowel wall or mesenteric thickening. No bowel obstruction. No free air or portal venous air. Vascular/Lymphatic: There is atherosclerotic calcification in the aorta and common iliac arteries. Major mesenteric vessels appear patent. There is no abdominal aortic aneurysm. There is no demonstrable adenopathy in the abdomen or pelvis. Reproductive: Uterus is anteverted. There is no pelvic mass or pelvic fluid collection. Other: Appendix appears normal. No abscess or ascites is appreciable in the abdomen or pelvis. Musculoskeletal: There is mild lumbar levoscoliosis. There is degenerative type change in the pubic symphysis region. There are no blastic or lytic bone lesions. No intramuscular or abdominal wall lesions are identified. IMPRESSION: There is cholelithiasis. By CT, the gallbladder wall does not appear appreciably thickened. Liver  prominent with mild focal fatty infiltration. Benign left adrenal adenoma. No bowel obstruction. No abscess. Appendix appears normal. No renal or ureteral calculus. No hydronephrosis. Breast implants bilaterally.  Status post mastectomies bilaterally. Electronically Signed   By: Lowella Grip III M.D.   On: 11/25/2015 08:52   US Abdomen Limited  11/25/2015  CLINICAL DATA:  Right upper quadrant pain for 1 week. History of breast carcinoma EXAM: US ABDOMEN LIMITED - RIGHT UPPER QUADRANT COMPARISON:  CT abdomen and pelvis November 25, 2015; right upper quadrant ultrasound August 28, 2014 FINDINGS: Gallbladder: There is a 1.3 echogenic focus in the gallbladder which moves in shadows consistent with cholelithiasis. There is no gallbladder wall thickening or pericholecystic fluid. No sonographic Murphy sign noted by sonographer. Common bile duct: Diameter: 7 mm with tapering distally. No mass or calculus seen in the biliary ductal system. Liver: No focal lesion identified. Within normal limits in parenchymal echogenicity. IMPRESSION: Cholelithiasis. No gallbladder wall thickening. A small cyst seen in the right lobe of the liver on CT is not appreciable by ultrasound. No focal liver lesions are seen by ultrasound. Electronically Signed   By: Lowella Grip III M.D.   On: 11/25/2015 10:25   I have personally reviewed and evaluated these images and lab results as part of my medical decision-making.   EKG Interpretation None      MDM   Final diagnoses:  Epigastric abdominal pain   Sharp, burning epigastric pain that radiates to both sides of her chest . Fatigue. Overall weakness. Vomiting. Worsened over last couple days. 7 days total of symptoms. Exam here overall ok but with epigastric ttp. Skin tenting. Slight orthostatic tachycardia.  Concern for possible pancreatitis v cholecystitis v gastritis. Will rehydrate, eval with labs and imaging appropriately.  Labs and imaging ok. Patient stable  and pain free for majority of ED stay. HR improved. Stable for dc with pcp follow up.     Merrily Pew, MD 11/25/15 [phone]

## 2015-11-25 NOTE — ED Notes (Signed)
Pt c/o mid-epigastric pain and burning with n/v intermittently x 1 week. Pt states she has gallstones and pain and symptoms feel similar.

## 2015-12-15 ENCOUNTER — Telehealth: Payer: Self-pay | Admitting: Family Medicine

## 2015-12-15 NOTE — Telephone Encounter (Signed)
Received jury duty summons for Feb 6. Would like dr Andria Frames to write a letter to excuse her. Will pick up when ready

## 2015-12-15 NOTE — Telephone Encounter (Signed)
Will forward to MD. Pennye Beeghly,CMA  

## 2015-12-16 NOTE — Telephone Encounter (Signed)
Done

## 2015-12-16 NOTE — Telephone Encounter (Signed)
Makayla Owens is calling back to request the PCP to leave this up front desk instead of mailing it. [name], ASA

## 2015-12-20 NOTE — Telephone Encounter (Signed)
LM for mother that letter is ready for pick up. Jazmin Hartsell,CMA

## 2016-03-19 ENCOUNTER — Encounter (HOSPITAL_COMMUNITY): Payer: Self-pay | Admitting: Emergency Medicine

## 2016-03-19 ENCOUNTER — Emergency Department (HOSPITAL_COMMUNITY): Payer: Self-pay

## 2016-03-19 DIAGNOSIS — F329 Major depressive disorder, single episode, unspecified: Secondary | ICD-10-CM | POA: Insufficient documentation

## 2016-03-19 DIAGNOSIS — Z8669 Personal history of other diseases of the nervous system and sense organs: Secondary | ICD-10-CM | POA: Insufficient documentation

## 2016-03-19 DIAGNOSIS — J40 Bronchitis, not specified as acute or chronic: Secondary | ICD-10-CM | POA: Insufficient documentation

## 2016-03-19 DIAGNOSIS — E119 Type 2 diabetes mellitus without complications: Secondary | ICD-10-CM | POA: Insufficient documentation

## 2016-03-19 DIAGNOSIS — R197 Diarrhea, unspecified: Secondary | ICD-10-CM | POA: Insufficient documentation

## 2016-03-19 DIAGNOSIS — Z87891 Personal history of nicotine dependence: Secondary | ICD-10-CM | POA: Insufficient documentation

## 2016-03-19 DIAGNOSIS — Z853 Personal history of malignant neoplasm of breast: Secondary | ICD-10-CM | POA: Insufficient documentation

## 2016-03-19 LAB — CBC WITH DIFFERENTIAL/PLATELET
BASOS ABS: 0 10*3/uL (ref 0.0–0.1)
Basophils Relative: 0 %
EOS ABS: 0.2 10*3/uL (ref 0.0–0.7)
Eosinophils Relative: 2 %
HCT: 39.3 % (ref 36.0–46.0)
HEMOGLOBIN: 13.8 g/dL (ref 12.0–15.0)
LYMPHS ABS: 4.3 10*3/uL — AB (ref 0.7–4.0)
LYMPHS PCT: 40 %
MCH: 32.2 pg (ref 26.0–34.0)
MCHC: 35.1 g/dL (ref 30.0–36.0)
MCV: 91.6 fL (ref 78.0–100.0)
Monocytes Absolute: 0.6 10*3/uL (ref 0.1–1.0)
Monocytes Relative: 6 %
NEUTROS PCT: 52 %
Neutro Abs: 5.5 10*3/uL (ref 1.7–7.7)
Platelets: 283 10*3/uL (ref 150–400)
RBC: 4.29 MIL/uL (ref 3.87–5.11)
RDW: 13.2 % (ref 11.5–15.5)
WBC: 10.6 10*3/uL — AB (ref 4.0–10.5)

## 2016-03-19 LAB — BASIC METABOLIC PANEL
ANION GAP: 10 (ref 5–15)
BUN: 11 mg/dL (ref 6–20)
CHLORIDE: 104 mmol/L (ref 101–111)
CO2: 22 mmol/L (ref 22–32)
Calcium: 8.5 mg/dL — ABNORMAL LOW (ref 8.9–10.3)
Creatinine, Ser: 0.51 mg/dL (ref 0.44–1.00)
GFR calc Af Amer: >60 mL/min (ref >60)
GFR calc non Af Amer: >60 mL/min (ref >60)
Glucose, Bld: 88 mg/dL (ref 65–99)
POTASSIUM: 4.1 mmol/L (ref 3.5–5.1)
SODIUM: 136 mmol/L (ref 135–145)

## 2016-03-19 NOTE — ED Notes (Addendum)
Patient complaining of cough x 1 week. States she is coughing up light brown, thick phlegm. States "it feels like I can't break it up and feels tight in my chest like when I have bronchitis."

## 2016-03-19 NOTE — ED Provider Notes (Signed)
CSN: [id number]     Arrival date & time 03/19/16  2156 History  By signing my name below, I, Irene Pap, attest that this documentation has been prepared under the direction and in the presence of Rolland Porter, MD at 0014 AM. Electronically Signed: Irene Pap, ED Scribe. 03/19/2016. 12:20 AM.  Chief Complaint  Patient presents with  . Cough   The history is provided by the patient. No language interpreter was used.  HPI Comments: Makayla Owens is a 55 y.o. Female with a hx of DM, fibromyalgia, diabetic neuropathy, and breast cancer who presents to the Emergency Department complaining of productive cough with thick, light tan sputum onset one week ago. Pt reports associated chills and low grade fever that has since resolved, whitish rhinorrhea, nausea, weight loss of six lbs, decreased appetite, chest pain (center that is constant and worse with coughing), and diarrhea that began 4 days ago after eating at a church picnic. She has not taken anything for her symptoms. She denies vomiting, wheezing, abdominal pain or sore throat. Pt is a smoker, 4 cigarettes a day.   PCP Dr Andria Frames  Past Medical History  Diagnosis Date  . Diabetes mellitus   . Breast cancer ([employer])   . Depression   . Migraines   . Gall bladder stones    Past Surgical History  Procedure Laterality Date  . Mastectomy      Bilateral  . Mastectomy Bilateral   . Breast surgery     Family History  Problem Relation Age of Onset  . Cancer Mother   . Heart failure Mother   . Diabetes Father   . Heart failure Father   . Cancer Father   . Stroke Father    Social History  Substance Use Topics  . Smoking status: Former Smoker -- 0.25 packs/day for 17 years    Types: Cigarettes    Quit date: 03/04/2015  . Smokeless tobacco: Never Used  . Alcohol Use: No   On disability for fibromyalgia, carpel tunnel and neuropathy Smokes 4 cig/day  OB History    Gravida Para Term Preterm AB TAB SAB Ectopic Multiple Living   2 1 1   1  1   1      Review of Systems  Respiratory: Positive for cough.   All other systems reviewed and are negative.  Allergies  Nitrofurantoin; Elavil; Lipitor; Morphine and related; Nsaids; Other; and Statins  Home Medications   Prior to Admission medications   Medication Sig Start Date End Date Taking? Authorizing Provider  azithromycin (ZITHROMAX) 250 MG tablet Take 2 po the first day then once a day for the next 4 days. 03/20/16   Rolland Porter, MD  citalopram (CELEXA) 40 MG tablet TAKE 1 TABLET BY MOUTH DAILY 07/25/15   Zenia Resides, MD  gabapentin (NEURONTIN) 300 MG capsule Take 1 capsule (300 mg total) by mouth 3 (three) times daily. Patient taking differently: Take 300 mg by mouth 3 (three) times daily as needed (pain).  09/28/15   Zenia Resides, MD  losartan (COZAAR) 50 MG tablet Take 1 tablet (50 mg total) by mouth daily. 11/12/14   Zenia Resides, MD  metFORMIN (GLUCOPHAGE) 1000 MG tablet TAKE 1 BY MOUTH TWICE DAILY WITH A MEAL 09/06/15   Zenia Resides, MD  Multiple Vitamin (MULTIVITAMIN WITH MINERALS) TABS tablet Take 1 tablet by mouth daily.    Historical Provider, MD  vitamin C (ASCORBIC ACID) 500 MG tablet Take 500 mg by mouth daily.  Historical Provider, MD   BP 135/74 mmHg  Pulse 85  Temp(Src) 98.8 F (37.1 C) (Oral)  Resp 20  Ht 5\' 6"  (1.676 m)  Wt 150 lb (68.04 kg)  BMI 24.22 kg/m2  SpO2 97%  Vital signs normal   Physical Exam  Constitutional: She is oriented to person, place, and time. She appears well-developed and well-nourished.  Non-toxic appearance. She does not appear ill. No distress.  HENT:  Head: Normocephalic and atraumatic.  Right Ear: External ear normal.  Left Ear: External ear normal.  Nose: Nose normal. No mucosal edema or rhinorrhea.  Mouth/Throat: Oropharynx is clear and moist. Mucous membranes are dry (minimally). No dental abscesses or uvula swelling.  Eyes: Conjunctivae and EOM are normal. Pupils are equal, round, and reactive to  light.  Neck: Normal range of motion and full passive range of motion without pain. Neck supple.  Cardiovascular: Normal rate, regular rhythm and normal heart sounds.  Exam reveals no gallop and no friction rub.   No murmur heard. Pulmonary/Chest: Effort normal and breath sounds normal. No respiratory distress. She has no wheezes. She has no rhonchi. She has no rales. She exhibits tenderness. She exhibits no crepitus.    Tenderness to the costochondral junctions  Abdominal: Soft. Normal appearance and bowel sounds are normal. She exhibits no distension. There is no tenderness. There is no rebound and no guarding.  Musculoskeletal: Normal range of motion. She exhibits no edema or tenderness.  Moves all extremities well.   Neurological: She is alert and oriented to person, place, and time. She has normal strength. No cranial nerve deficit.  Skin: Skin is warm, dry and intact. No rash noted. No erythema. No pallor.  Psychiatric: She has a normal mood and affect. Her speech is normal and behavior is normal. Her mood appears not anxious.  Nursing note and vitals reviewed.   ED Course  Procedures (including critical care time) DIAGNOSTIC STUDIES: Oxygen Saturation is 97% on RA, normal by my interpretation.    COORDINATION OF CARE: 12:18 AM-Discussed treatment plan which includes labs, x-ray, antibiotics, and anti-inflammatory with pt at bedside and pt agreed to plan.   Patient's BUN and creatinine are normal. She can take Motrin or Aleve over-the-counter for her chest wall pain. She was started on antibiotics for her bronchitis.   Labs Review Results for orders placed or performed during the hospital encounter of 03/20/16  CBC with Differential  Result Value Ref Range   WBC 10.6 (H) 4.0 - 10.5 K/uL   RBC 4.29 3.87 - 5.11 MIL/uL   Hemoglobin 13.8 12.0 - 15.0 g/dL   HCT 39.3 36.0 - 46.0 %   MCV 91.6 78.0 - 100.0 fL   MCH 32.2 26.0 - 34.0 pg   MCHC 35.1 30.0 - 36.0 g/dL   RDW 13.2 11.5  - 15.5 %   Platelets 283 150 - 400 K/uL   Neutrophils Relative % 52 %   Neutro Abs 5.5 1.7 - 7.7 K/uL   Lymphocytes Relative 40 %   Lymphs Abs 4.3 (H) 0.7 - 4.0 K/uL   Monocytes Relative 6 %   Monocytes Absolute 0.6 0.1 - 1.0 K/uL   Eosinophils Relative 2 %   Eosinophils Absolute 0.2 0.0 - 0.7 K/uL   Basophils Relative 0 %   Basophils Absolute 0.0 0.0 - 0.1 K/uL  Basic metabolic panel  Result Value Ref Range   Sodium 136 135 - 145 mmol/L   Potassium 4.1 3.5 - 5.1 mmol/L   Chloride 104 101 -  111 mmol/L   CO2 22 22 - 32 mmol/L   Glucose, Bld 88 65 - 99 mg/dL   BUN 11 6 - 20 mg/dL   Creatinine, Ser 0.51 0.44 - 1.00 mg/dL   Calcium 8.5 (L) 8.9 - 10.3 mg/dL   GFR calc non Af Amer >60 >60 mL/min   GFR calc Af Amer >60 >60 mL/min   Anion gap 10 5 - 15   Laboratory interpretation all normal except mild leukocytosis   Imaging Review Dg Chest 2 View  03/19/2016  CLINICAL DATA:  Central chest pain radiating into the back. Productive cough. Body aches. Symptoms for 1 week. EXAM: CHEST  2 VIEW COMPARISON:  11/25/2015 FINDINGS: The cardiomediastinal contours are normal. The lungs are clear. Pulmonary vasculature is normal. No consolidation, pleural effusion, or pneumothorax. No acute osseous abnormalities are seen. IMPRESSION: No acute pulmonary process. Electronically Signed   By: Jeb Levering M.D.   On: 03/19/2016 22:46   I have personally reviewed and evaluated these images and lab results as part of my medical decision-making.    MDM   Final diagnoses:  Bronchitis    Discharge Medication List as of 03/20/2016 12:24 AM    START taking these medications   Details  azithromycin (ZITHROMAX) 250 MG tablet Take 2 po the first day then once a day for the next 4 days., Print        Plan discharge  Rolland Porter, MD, FACEP    I personally performed the services described in this documentation, which was scribed in my presence. The recorded information has been reviewed and  considered.  Rolland Porter, MD, Barbette Or, MD 03/20/16 [phone]

## 2016-03-20 ENCOUNTER — Emergency Department (HOSPITAL_COMMUNITY)
Admission: EM | Admit: 2016-03-20 | Discharge: 2016-03-20 | Disposition: A | Discharge status: Home / Self Care | Payer: Self-pay | Attending: Emergency Medicine | Admitting: Emergency Medicine

## 2016-03-20 DIAGNOSIS — J40 Bronchitis, not specified as acute or chronic: Secondary | ICD-10-CM

## 2016-03-20 MED ORDER — AZITHROMYCIN 250 MG PO TABS: ORAL_TABLET | ORAL | Status: DC

## 2016-03-20 NOTE — Discharge Instructions (Signed)
Drink plenty of fluids. Take the antibiotic until gone. You can take mucinex DM OTC for cough. You can take ibuprofen 600 mg every 6 hrs OR Aleve 2 tabs twice a day for your chest wall pain. Recheck if you get a fever, struggle to breathe or get worse.

## 2016-04-27 IMAGING — CR DG CHEST 2V
2 series · 2 of 2 positions shown · non-contrast
Comparison: Chest radiograph 06/02/2009

CLINICAL DATA: Productive cough and wheezing for 2 weeks.

EXAM:
CHEST  2 VIEW

[view not recorded (1 of 2)]
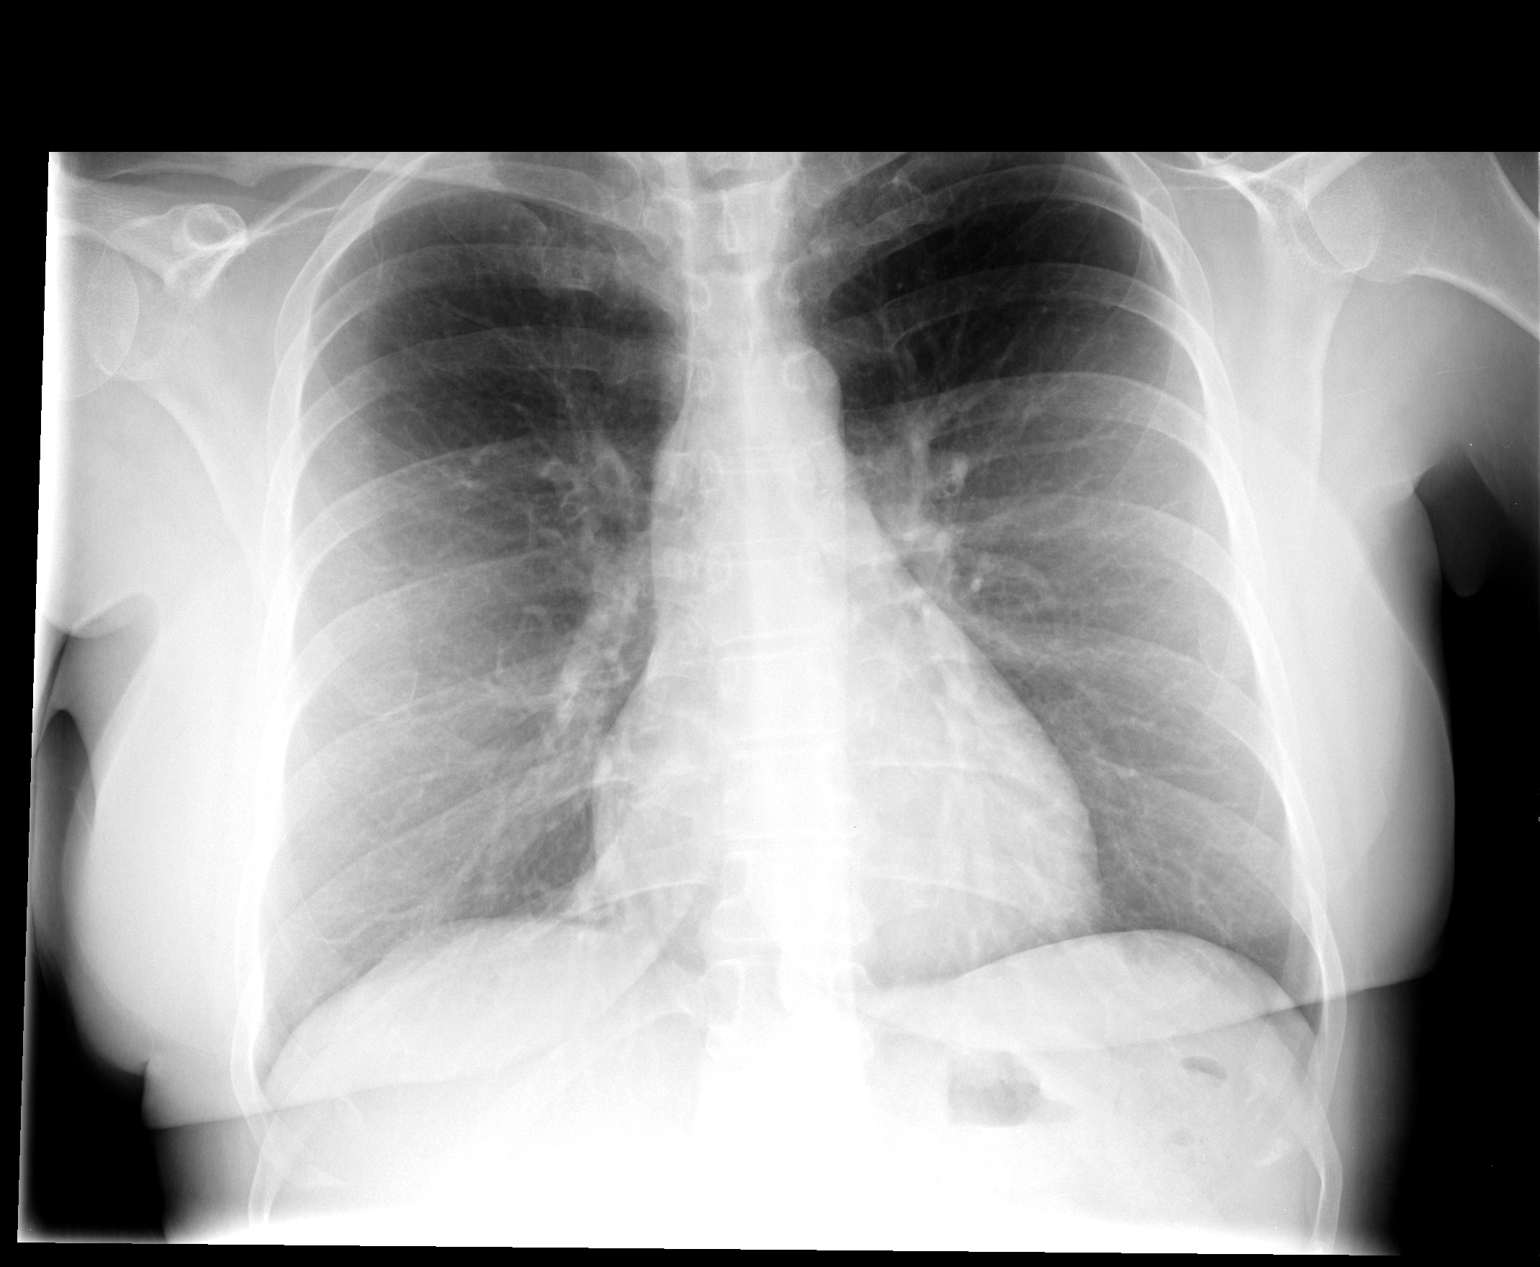

[view not recorded (2 of 2)]
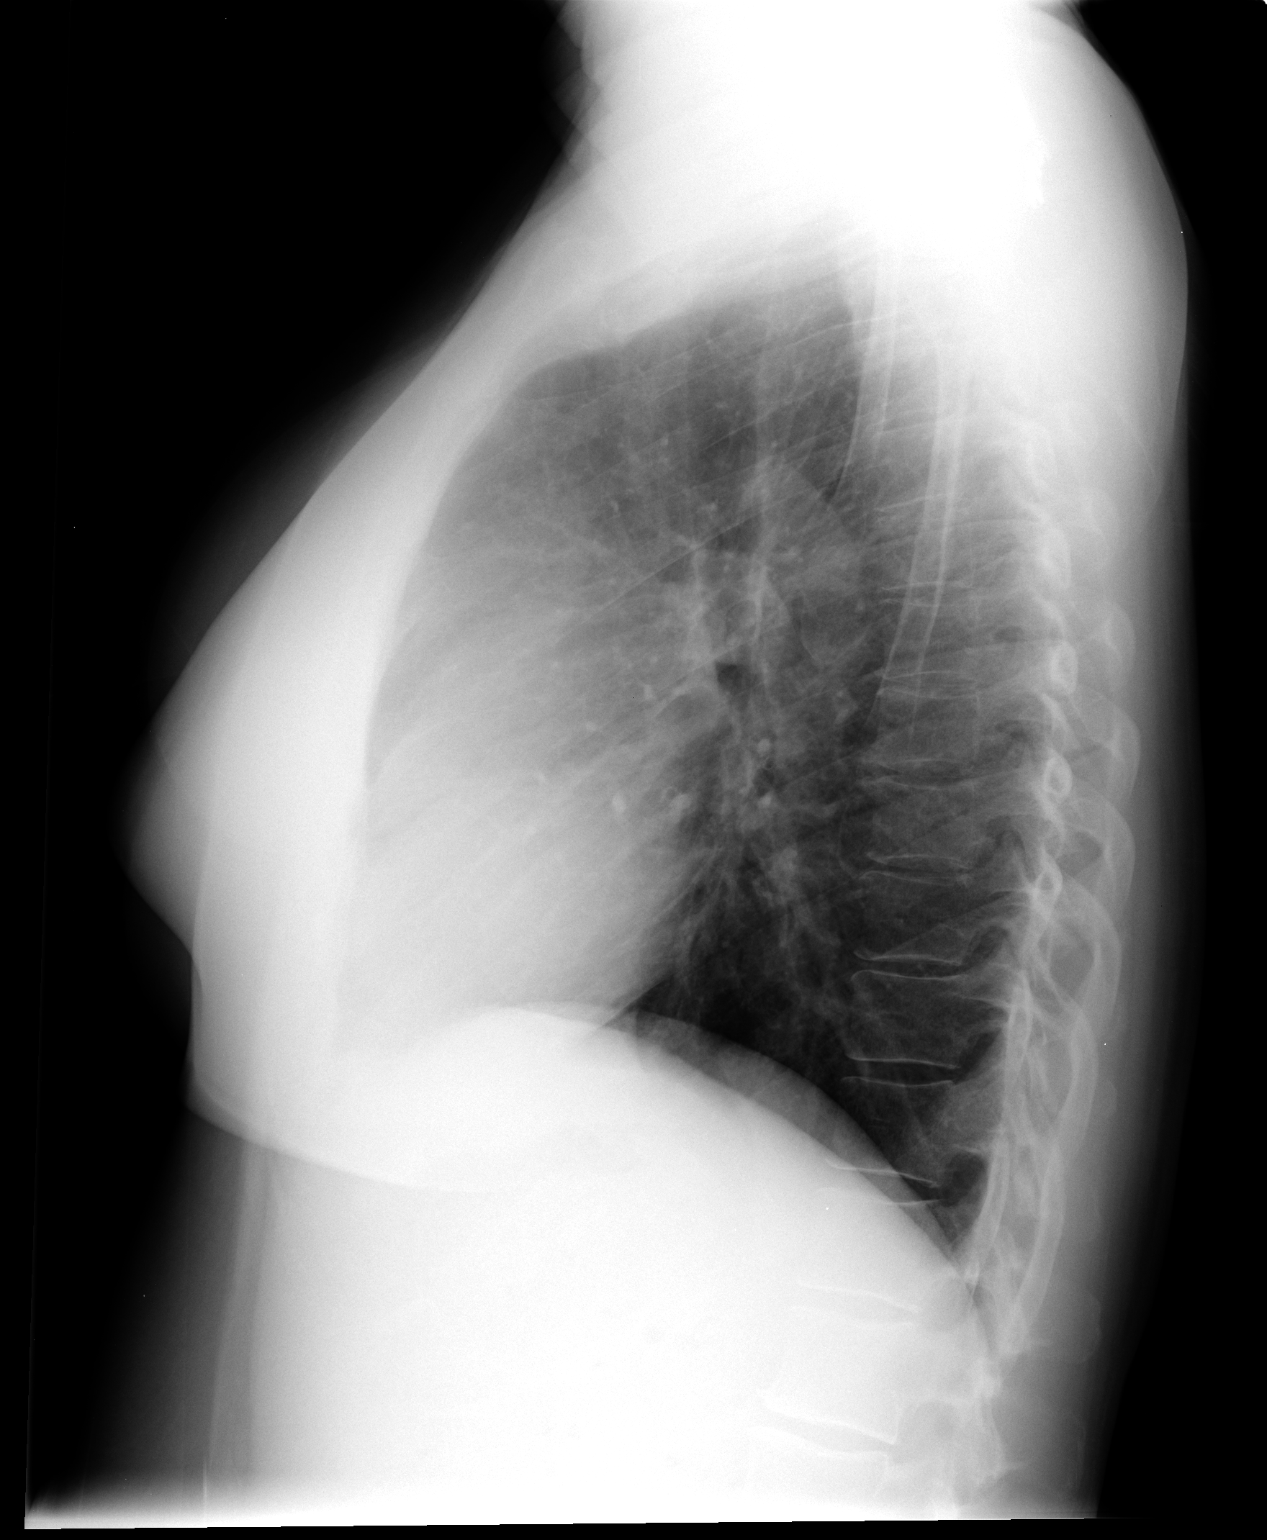

[2 of 2 positions shown; findings below may reference images not displayed]

FINDINGS: The heart size and mediastinal contours are within normal limits.
Both lungs are well expanded and clear. The visualized skeletal
structures are unremarkable.
IMPRESSION: No active cardiopulmonary disease.

## 2016-08-15 ENCOUNTER — Encounter: Payer: Self-pay | Admitting: Family Medicine

## 2016-08-15 ENCOUNTER — Ambulatory Visit (INDEPENDENT_AMBULATORY_CARE_PROVIDER_SITE_OTHER): Payer: Self-pay | Admitting: Family Medicine

## 2016-08-15 VITALS — BP 135/79 | HR 82 | Temp 97.8°F | Ht 66.0 in | Wt 147.6 lb

## 2016-08-15 DIAGNOSIS — N181 Chronic kidney disease, stage 1: Secondary | ICD-10-CM

## 2016-08-15 DIAGNOSIS — K851 Biliary acute pancreatitis without necrosis or infection: Secondary | ICD-10-CM

## 2016-08-15 DIAGNOSIS — Z114 Encounter for screening for human immunodeficiency virus [HIV]: Secondary | ICD-10-CM

## 2016-08-15 DIAGNOSIS — E1122 Type 2 diabetes mellitus with diabetic chronic kidney disease: Secondary | ICD-10-CM

## 2016-08-15 DIAGNOSIS — Z23 Encounter for immunization: Secondary | ICD-10-CM

## 2016-08-15 DIAGNOSIS — K8020 Calculus of gallbladder without cholecystitis without obstruction: Secondary | ICD-10-CM

## 2016-08-15 LAB — COMPLETE METABOLIC PANEL WITH GFR
ALBUMIN: 4.3 g/dL (ref 3.6–5.1)
ALK PHOS: 79 U/L (ref 33–130)
ALT: 11 U/L (ref 6–29)
AST: 12 U/L (ref 10–35)
BUN: 12 mg/dL (ref 7–25)
CALCIUM: 9.3 mg/dL (ref 8.6–10.4)
CO2: 25 mmol/L (ref 20–31)
Chloride: 108 mmol/L (ref 98–110)
Creat: 0.58 mg/dL (ref 0.50–1.05)
Glucose, Bld: 86 mg/dL (ref 65–99)
POTASSIUM: 4 mmol/L (ref 3.5–5.3)
SODIUM: 140 mmol/L (ref 135–146)
Total Bilirubin: 0.4 mg/dL (ref 0.2–1.2)
Total Protein: 6.7 g/dL (ref 6.1–8.1)

## 2016-08-15 LAB — POCT GLYCOSYLATED HEMOGLOBIN (HGB A1C): Hemoglobin A1C: 6.8

## 2016-08-15 LAB — LIPASE: LIPASE: 50 U/L (ref 7–60)

## 2016-08-15 NOTE — Patient Instructions (Signed)
You likely have pancreatitis again.  We can treat as an outpatient. Main reasons you would need to be in the hospital are: 1. Vomiting to the point you can't keep fluids down. 2. Fever 3. Uncontrolled pain.

## 2016-08-16 LAB — HIV ANTIBODY (ROUTINE TESTING W REFLEX): HIV: NONREACTIVE

## 2016-08-16 LAB — HEPATITIS C ANTIBODY: HCV AB: NEGATIVE

## 2016-08-16 NOTE — Assessment & Plan Note (Signed)
Reasonable to check LFTs and lipase.  Does not meet criteria for inpatient admit.

## 2016-08-16 NOTE — Assessment & Plan Note (Signed)
Hx of intermitant biliary colic - but typically lasts only 5 hours, not three days.

## 2016-08-16 NOTE — Progress Notes (Signed)
   Subjective:    Patient ID: Makayla Owens, female    DOB: 05/05/1961, 55 y.o.   MRN: [id number]  HPI Makayla Owens has abd pain and is worried about recurrent pancreatitis.  Abd pain x 3 days.  Mild decreased appetite.  No vomiting.  Getting in good fluids.  No fever.  Does not feel sick, just hurts. Has known gallstones and is unable to afford elective cholecystectomy.  Otherwise feels well  Still uninsured.  Her disability status is pending.     Review of Systems     Objective:   Physical ExamLungs clear Cardiac RRR without m or g Abd epigastric discomfort without rebound.        Assessment & Plan:

## 2016-08-16 NOTE — Assessment & Plan Note (Signed)
Test x 1.   

## 2016-08-23 ENCOUNTER — Encounter: Payer: Self-pay | Admitting: Family Medicine

## 2016-09-04 ENCOUNTER — Other Ambulatory Visit: Payer: Self-pay | Admitting: Family Medicine

## 2016-10-17 ENCOUNTER — Ambulatory Visit: Payer: Self-pay | Admitting: Family Medicine

## 2016-10-24 ENCOUNTER — Ambulatory Visit: Payer: Self-pay | Admitting: Family Medicine

## 2016-10-29 ENCOUNTER — Other Ambulatory Visit: Payer: Self-pay | Admitting: Family Medicine

## 2017-01-01 ENCOUNTER — Telehealth: Payer: Self-pay | Admitting: Family Medicine

## 2017-01-01 MED ORDER — AZITHROMYCIN 250 MG PO TABS: ORAL_TABLET | ORAL | 0 refills | Status: DC

## 2017-01-01 NOTE — Telephone Encounter (Signed)
Called and discussed.  Rx with azithro

## 2017-01-01 NOTE — Telephone Encounter (Signed)
Patient's mother calls, states patient has " what's going around" and requesting Dr. Andria Frames to send in an antibiotic. I advised her that she may need an appt but mother insist I send a message to ask. Please advise.

## 2017-02-02 ENCOUNTER — Encounter (HOSPITAL_COMMUNITY): Payer: Self-pay | Admitting: *Deleted

## 2017-02-02 ENCOUNTER — Emergency Department (HOSPITAL_COMMUNITY): Payer: Self-pay

## 2017-02-02 ENCOUNTER — Emergency Department (HOSPITAL_COMMUNITY)
Admission: EM | Admit: 2017-02-02 | Discharge: 2017-02-03 | Disposition: A | Discharge status: Home / Self Care | Payer: Self-pay | Attending: Emergency Medicine | Admitting: Emergency Medicine

## 2017-02-02 DIAGNOSIS — J40 Bronchitis, not specified as acute or chronic: Secondary | ICD-10-CM

## 2017-02-02 DIAGNOSIS — N181 Chronic kidney disease, stage 1: Secondary | ICD-10-CM | POA: Insufficient documentation

## 2017-02-02 DIAGNOSIS — Z7984 Long term (current) use of oral hypoglycemic drugs: Secondary | ICD-10-CM | POA: Insufficient documentation

## 2017-02-02 DIAGNOSIS — Z87891 Personal history of nicotine dependence: Secondary | ICD-10-CM | POA: Insufficient documentation

## 2017-02-02 DIAGNOSIS — E1122 Type 2 diabetes mellitus with diabetic chronic kidney disease: Secondary | ICD-10-CM | POA: Insufficient documentation

## 2017-02-02 DIAGNOSIS — Z853 Personal history of malignant neoplasm of breast: Secondary | ICD-10-CM | POA: Insufficient documentation

## 2017-02-02 MED ORDER — IPRATROPIUM-ALBUTEROL 0.5-2.5 (3) MG/3ML IN SOLN
3.0000 mL | Freq: Once | RESPIRATORY_TRACT | Status: AC
  Administered 2017-02-03: 3 mL via RESPIRATORY_TRACT

## 2017-02-02 NOTE — ED Provider Notes (Signed)
[facility] DEPT Provider Note   CSN: [id number] Arrival date & time: 02/02/17  2311  By signing my name below, I, Gwenlyn Fudge, attest that this documentation has been prepared under the direction and in the presence of Delora Fuel, MD. Electronically Signed: Gwenlyn Fudge, ED Scribe. 02/02/17. 11:45 PM.   History   Chief Complaint Chief Complaint  Patient presents with  . Cough   The history is provided by the patient. No language interpreter was used.   HPI Comments: Makayla Owens is a 56 y.o. female with PMHx of DM who presents to the Emergency Department complaining of gradual onset, persistent, moderate productive cough for 2 weeks. Sputum began as a dark yellow but has progressed to a beige color. Pt states cough is worse at night. She has used an inhaler in the past with significant relief. Pt reports associated chest congestion, sinus pressure, sinus pain, shortness of breath, diaphoresis, generalized body aches. She notes that her symptoms began with sinus congestion. Pt quit smoking approximately 1 year ago. Pt reports sick contact at home. She has tried DayQuil and Z-pak with no significant relief. Denies fever, chills, nausea, vomiting, diarrhea, or any other complaints at this time.    Past Medical History:  Diagnosis Date  . Breast cancer ([place])   . Depression   . Diabetes mellitus   . Gall bladder stones   . Migraines     Patient Active Problem List   Diagnosis Date Noted  . CKD stage 1 due to type 2 diabetes mellitus ([place]) 09/28/2015  . Need for hepatitis C screening test 06/03/2015  . Gallstone pancreatitis 09/16/2014  . Right carpal tunnel syndrome 07/30/2014  . Fibromyalgia syndrome 07/18/2012  . NECK PAIN 05/19/2010  . Calculus of gallbladder 05/05/2010  . Stopped smoking 09/10/2009  . DM (diabetes mellitus), type 2 with renal complications ([place]) [id number]  . HYPERCHOLESTEROLEMIA 04/27/2009  . DEPRESSION 04/27/2009  . Breast cancer in female [facility])  04/27/2009    Past Surgical History:  Procedure Laterality Date  . BREAST SURGERY    . MASTECTOMY     Bilateral  . MASTECTOMY Bilateral     OB History    Gravida Para Term Preterm AB Living   2 1 1   1 1    SAB TAB Ectopic Multiple Live Births   1               Home Medications    Prior to Admission medications   Medication Sig Start Date End Date Taking? Authorizing Provider  citalopram (CELEXA) 40 MG tablet TAKE 1 TABLET BY MOUTH DAILY 09/04/16  Yes Zenia Resides, MD  gabapentin (NEURONTIN) 300 MG capsule Take 1 capsule (300 mg total) by mouth 3 (three) times daily. Patient taking differently: Take 300 mg by mouth 3 (three) times daily as needed (pain).  09/28/15  Yes Zenia Resides, MD  metFORMIN (GLUCOPHAGE) 1000 MG tablet TAKE 1 TABLET BY MOUTH TWICE DAILY WITH A MEAL 10/30/16  Yes Zenia Resides, MD  Multiple Vitamin (MULTIVITAMIN WITH MINERALS) TABS tablet Take 1 tablet by mouth daily.   Yes Historical Provider, MD  vitamin C (ASCORBIC ACID) 500 MG tablet Take 500 mg by mouth daily.   Yes Historical Provider, MD  azithromycin (ZITHROMAX) 250 MG tablet Take 2 po the first day then once a day for the next 4 days. 01/01/17   Zenia Resides, MD  losartan (COZAAR) 50 MG tablet Take 1 tablet (50 mg total) by mouth daily. 11/12/14  Zenia Resides, MD    Family History Family History  Problem Relation Age of Onset  . Cancer Mother   . Heart failure Mother   . Diabetes Father   . Heart failure Father   . Cancer Father   . Stroke Father     Social History Social History  Substance Use Topics  . Smoking status: Former Smoker    Packs/day: 0.25    Years: 17.00    Types: Cigarettes    Quit date: 03/04/2015  . Smokeless tobacco: Never Used  . Alcohol use No     Allergies   Nitrofurantoin; Elavil [amitriptyline]; Lipitor [atorvastatin]; Morphine and related; Nsaids; Other; and Statins   Review of Systems Review of Systems  Constitutional: Positive for  diaphoresis. Negative for chills and fever.  HENT: Positive for congestion, sinus pain and sinus pressure.   Respiratory: Positive for cough and shortness of breath.   Gastrointestinal: Negative for diarrhea, nausea and vomiting.  Musculoskeletal:       +Generalized body aches  All other systems reviewed and are negative.  Physical Exam Updated Vital Signs BP 155/86 (BP Location: Right Arm)   Pulse 80   Temp 98.3 F (36.8 C) (Oral)   Resp 20   Ht 5\' 6"  (1.676 m)   Wt 160 lb (72.6 kg)   SpO2 94%   BMI 25.82 kg/m   Physical Exam  Constitutional: She is oriented to person, place, and time. She appears well-developed and well-nourished.  HENT:  Head: Normocephalic and atraumatic.  Moderate tenderness over maxillary sinuses Small amount of purulent nasal drainage  Eyes: EOM are normal. Pupils are equal, round, and reactive to light.  Neck: Normal range of motion. Neck supple. No JVD present.  Cardiovascular: Normal rate, regular rhythm and normal heart sounds.   No murmur heard. Pulmonary/Chest: Effort normal. She has wheezes. She has no rales. She exhibits no tenderness.  Prolongs expiration phase with slight wheezing noted on force exhalation  Abdominal: Soft. Bowel sounds are normal. She exhibits no distension and no mass. There is no tenderness.  Musculoskeletal: Normal range of motion. She exhibits no edema.  Lymphadenopathy:    She has no cervical adenopathy.  Neurological: She is alert and oriented to person, place, and time. No cranial nerve deficit. She exhibits normal muscle tone. Coordination normal.  Skin: Skin is warm and dry. No rash noted.  Psychiatric: She has a normal mood and affect. Her behavior is normal. Judgment and thought content normal.  Nursing note and vitals reviewed.   ED Treatments / Results  DIAGNOSTIC STUDIES: Oxygen Saturation is 94% on RA, adequate by my interpretation.    COORDINATION OF CARE: 11:36 PM Discussed treatment plan with pt at  bedside which includes Ipratropium-Albuterol nebulizer solution and DG Chest and pt agreed to plan.  Radiology Dg Chest 2 View  Result Date: 02/03/2017 CLINICAL DATA:  Subacute onset of productive cough. Initial encounter. EXAM: CHEST  2 VIEW COMPARISON:  Chest radiograph performed 03/19/2016 FINDINGS: The lungs are well-aerated and clear. There is no evidence of focal opacification, pleural effusion or pneumothorax. The heart is normal in size; the mediastinal contour is within normal limits. No acute osseous abnormalities are seen. IMPRESSION: No acute cardiopulmonary process seen. Electronically Signed   By: Garald Balding M.D.   On: 02/03/2017 00:08    Procedures Procedures (including critical care time)  Medications Ordered in ED Medications  predniSONE (DELTASONE) tablet 60 mg (not administered)  albuterol (PROVENTIL HFA;VENTOLIN HFA) 108 (90 Base) MCG/ACT  inhaler 2 puff (not administered)  ipratropium-albuterol (DUONEB) 0.5-2.5 (3) MG/3ML nebulizer solution 3 mL (3 mLs Nebulization Given 02/03/17 0014)     Initial Impression / Assessment and Plan / ED Course  I have reviewed the triage vital signs and the nursing notes.  Pertinent labs & imaging results that were available during my care of the patient were reviewed by me and considered in my medical decision making (see chart for details).  Cough for one month strongly suggestive of bronchitis. Old records are reviewed, and she does have prior ED visits for bronchitis. There is some element of bronchospasm. She is given albuterol with ipratropium via nebulizer with good relief of symptoms. Chest x-ray shows no evidence of pneumonia. She is given an albuterol inhaler to take home and is given a prescription for a 5 day course of prednisone.  Final Clinical Impressions(s) / ED Diagnoses   Final diagnoses:  Bronchitis    New Prescriptions New Prescriptions   PREDNISONE (DELTASONE) 20 MG TABLET    Take 3 tablets (60 mg total) by  mouth daily with breakfast. 2 tabs po daily x 4 days   I personally performed the services described in this documentation, which was scribed in my presence. The recorded information has been reviewed and is accurate.     Delora Fuel, MD [phone]

## 2017-02-02 NOTE — ED Triage Notes (Signed)
Pt reports a cough for the past 2 weeks. Says was a yellow color that is now beige.

## 2017-02-03 MED ORDER — ALBUTEROL SULFATE HFA 108 (90 BASE) MCG/ACT IN AERS
2.0000 | INHALATION_SPRAY | RESPIRATORY_TRACT | Status: DC | PRN
  Administered 2017-02-03: 2 via RESPIRATORY_TRACT
  Filled 2017-02-03: qty 6.7

## 2017-02-03 MED ORDER — PREDNISONE 50 MG PO TABS
60.0000 mg | ORAL_TABLET | Freq: Once | ORAL | Status: AC
  Administered 2017-02-03: 60 mg via ORAL
  Filled 2017-02-03: qty 1

## 2017-02-03 MED ORDER — PREDNISONE 20 MG PO TABS: 60.0000 mg | ORAL_TABLET | Freq: Every day | ORAL | 0 refills | Status: DC

## 2017-02-03 NOTE — ED Notes (Signed)
Pt alert & oriented x4, stable gait. Patient given discharge instructions, paperwork & prescription(s). Patient  instructed to stop at the registration desk to finish any additional paperwork. Patient verbalized understanding. Pt left department w/ no further questions. 

## 2017-02-06 ENCOUNTER — Ambulatory Visit: Payer: Self-pay | Admitting: Family Medicine

## 2017-02-15 ENCOUNTER — Telehealth: Payer: Self-pay | Admitting: Family Medicine

## 2017-02-15 NOTE — Telephone Encounter (Signed)
Disability  Parking placard  form dropped off for at front desk for completion.  Verified that patient section of form has been completed.  Last DOS 10/24/16  .  Placed form in white team folder to be completed by clinical staff.  Makayla Owens

## 2017-02-18 NOTE — Telephone Encounter (Signed)
No clinical info needed for completion on Disability/Handicap Placard form.  Place form in Dr. Lowella Bandy  box for completion.  Katharina Caper, April D, [place]

## 2017-02-19 NOTE — Telephone Encounter (Signed)
Done

## 2017-02-19 NOTE — Telephone Encounter (Signed)
Patient's mother, Henrietta Dine, notified that form has been completed and is in the front office ready for pick up. Hubbard Hartshorn, RN, BSN

## 2017-02-19 NOTE — Telephone Encounter (Signed)
Pt is having to use a cane to work now.  Please let pt know when it is ready for pickup

## 2017-04-21 ENCOUNTER — Encounter (HOSPITAL_COMMUNITY): Payer: Self-pay | Admitting: *Deleted

## 2017-04-21 DIAGNOSIS — N181 Chronic kidney disease, stage 1: Secondary | ICD-10-CM | POA: Insufficient documentation

## 2017-04-21 DIAGNOSIS — Z7984 Long term (current) use of oral hypoglycemic drugs: Secondary | ICD-10-CM | POA: Insufficient documentation

## 2017-04-21 DIAGNOSIS — N390 Urinary tract infection, site not specified: Secondary | ICD-10-CM | POA: Insufficient documentation

## 2017-04-21 DIAGNOSIS — Z853 Personal history of malignant neoplasm of breast: Secondary | ICD-10-CM | POA: Insufficient documentation

## 2017-04-21 DIAGNOSIS — Z79899 Other long term (current) drug therapy: Secondary | ICD-10-CM | POA: Insufficient documentation

## 2017-04-21 DIAGNOSIS — E1122 Type 2 diabetes mellitus with diabetic chronic kidney disease: Secondary | ICD-10-CM | POA: Insufficient documentation

## 2017-04-21 DIAGNOSIS — Z87891 Personal history of nicotine dependence: Secondary | ICD-10-CM | POA: Insufficient documentation

## 2017-04-21 LAB — URINALYSIS, ROUTINE W REFLEX MICROSCOPIC
Bacteria, UA: NONE SEEN
Bilirubin Urine: NEGATIVE
GLUCOSE, UA: NEGATIVE mg/dL
HGB URINE DIPSTICK: NEGATIVE
Ketones, ur: NEGATIVE mg/dL
LEUKOCYTES UA: NEGATIVE
Nitrite: POSITIVE — AB
PH: 5 (ref 5.0–8.0)
Protein, ur: NEGATIVE mg/dL
Specific Gravity, Urine: 1.003 — ABNORMAL LOW (ref 1.005–1.030)

## 2017-04-21 NOTE — ED Triage Notes (Signed)
Pt presents with urinary symptoms this morning- urinary frequency, pain with urination and lower back pain.  Hx of UTI's in past, also states that her sugar has been running high lately.

## 2017-04-22 ENCOUNTER — Emergency Department (HOSPITAL_COMMUNITY)
Admission: EM | Admit: 2017-04-22 | Discharge: 2017-04-22 | Disposition: A | Discharge status: Home / Self Care | Payer: Self-pay | Attending: Emergency Medicine | Admitting: Emergency Medicine

## 2017-04-22 DIAGNOSIS — N390 Urinary tract infection, site not specified: Secondary | ICD-10-CM

## 2017-04-22 LAB — CBG MONITORING, ED: GLUCOSE-CAPILLARY: 141 mg/dL — AB (ref 65–99)

## 2017-04-22 MED ORDER — CIPROFLOXACIN HCL 250 MG PO TABS: 250.0000 mg | ORAL_TABLET | Freq: Two times a day (BID) | ORAL | 0 refills | Status: AC

## 2017-04-22 MED ORDER — CIPROFLOXACIN HCL 250 MG PO TABS
250.0000 mg | ORAL_TABLET | Freq: Once | ORAL | Status: AC
  Administered 2017-04-22: 250 mg via ORAL
  Filled 2017-04-22: qty 1

## 2017-04-22 NOTE — ED Notes (Signed)
Pt alert and oriented x 4. Stable gait. Pt given discharge papers/prescriptions. Pt told to stop by registration to complete any additional paperwork. Pt left the department with no further questions. 

## 2017-04-22 NOTE — ED Provider Notes (Signed)
[facility] DEPT Provider Note   CSN: [id number] Arrival date & time: 04/21/17  2236   By signing my name below, I, Eunice Blase, attest that this documentation has been prepared under the direction and in the presence of Sherwood Gambler, MD. Electronically signed, Eunice Blase, ED Scribe. 04/22/17. 12:43 AM.   History   Chief Complaint Chief Complaint  Patient presents with  . Recurrent UTI   The history is provided by the patient and medical records. No language interpreter was used.    Makayla Owens is a 56 y.o. female with h/o UTI's and DM who presents to the Emergency Department with concern for gradually worsening, recurring dysuria onset yesterday morning. Associated urgency, low abdomen pain, back pain, nausea surrounding urination episodes and increased urinary frequency noted. She states this is consistent with h/o UTI's.  No relief noted with AZO. Asking to not get keflex, pt states ciprofloxacin provides relief historically to UTI's. Keflex didn't help last time. Allergic to macrobid. Pt adds she has phenergan at home for nausea. Pt notes elevated CBG readings that measured ~298 last week; level of ~141 recorded PTA. Macrobid allergy noted. No vomiting. No other complaints at this time.   Past Medical History:  Diagnosis Date  . Breast cancer ([place])   . Depression   . Diabetes mellitus   . Gall bladder stones   . Migraines     Patient Active Problem List   Diagnosis Date Noted  . CKD stage 1 due to type 2 diabetes mellitus ([place]) 09/28/2015  . Need for hepatitis C screening test 06/03/2015  . Gallstone pancreatitis 09/16/2014  . Right carpal tunnel syndrome 07/30/2014  . Fibromyalgia syndrome 07/18/2012  . NECK PAIN 05/19/2010  . Calculus of gallbladder 05/05/2010  . Stopped smoking 09/10/2009  . DM (diabetes mellitus), type 2 with renal complications ([place]) 40/07/6760  . HYPERCHOLESTEROLEMIA 04/27/2009  . DEPRESSION 04/27/2009  . Breast cancer in female  [facility]) 04/27/2009    Past Surgical History:  Procedure Laterality Date  . BREAST SURGERY    . MASTECTOMY     Bilateral  . MASTECTOMY Bilateral     OB History    Gravida Para Term Preterm AB Living   2 1 1   1 1    SAB TAB Ectopic Multiple Live Births   1               Home Medications    Prior to Admission medications   Medication Sig Start Date End Date Taking? Authorizing Provider  azithromycin (ZITHROMAX) 250 MG tablet Take 2 po the first day then once a day for the next 4 days. 01/01/17   Zenia Resides, MD  citalopram (CELEXA) 40 MG tablet TAKE 1 TABLET BY MOUTH DAILY 09/04/16   Zenia Resides, MD  gabapentin (NEURONTIN) 300 MG capsule Take 1 capsule (300 mg total) by mouth 3 (three) times daily. Patient taking differently: Take 300 mg by mouth 3 (three) times daily as needed (pain).  09/28/15   Zenia Resides, MD  losartan (COZAAR) 50 MG tablet Take 1 tablet (50 mg total) by mouth daily. 11/12/14   Zenia Resides, MD  metFORMIN (GLUCOPHAGE) 1000 MG tablet TAKE 1 TABLET BY MOUTH TWICE DAILY WITH A MEAL 10/30/16   Hensel, Jamal Collin, MD  Multiple Vitamin (MULTIVITAMIN WITH MINERALS) TABS tablet Take 1 tablet by mouth daily.    [provider]  predniSONE (DELTASONE) 20 MG tablet Take 3 tablets (60 mg total) by mouth daily with breakfast.  2 tabs po daily x 4 days 0/9/23   Delora Fuel, MD  vitamin C (ASCORBIC ACID) 500 MG tablet Take 500 mg by mouth daily.    [provider]    Family History Family History  Problem Relation Age of Onset  . Cancer Mother   . Heart failure Mother   . Diabetes Father   . Heart failure Father   . Cancer Father   . Stroke Father     Social History Social History  Substance Use Topics  . Smoking status: Former Smoker    Packs/day: 0.25    Years: 17.00    Types: Cigarettes    Quit date: 03/04/2015  . Smokeless tobacco: Never Used  . Alcohol use No     Allergies   Nitrofurantoin; Elavil [amitriptyline];  Lipitor [atorvastatin]; Morphine and related; Nsaids; Other; and Statins   Review of Systems Review of Systems  Gastrointestinal: Positive for abdominal pain and nausea. Negative for vomiting.  Genitourinary: Positive for dysuria, frequency and urgency. Negative for flank pain, hematuria and pelvic pain.  Musculoskeletal: Positive for back pain.  Skin: Negative for wound.  All other systems reviewed and are negative.    Physical Exam Updated Vital Signs BP 135/90 (BP Location: Right Arm)   Pulse 79   Temp 97.4 F (36.3 C) (Oral)   Resp 20   Ht 5\' 6"  (1.676 m)   Wt 150 lb (68 kg)   SpO2 96%   BMI 24.21 kg/m   Physical Exam  Constitutional: She is oriented to person, place, and time. She appears well-developed and well-nourished.  HENT:  Head: Normocephalic and atraumatic.  Right Ear: External ear normal.  Left Ear: External ear normal.  Nose: Nose normal.  Eyes: Right eye exhibits no discharge. Left eye exhibits no discharge.  Cardiovascular: Normal rate, regular rhythm and normal heart sounds.   Pulmonary/Chest: Effort normal and breath sounds normal.  Abdominal: Soft. There is no tenderness. There is no CVA tenderness.  Neurological: She is alert and oriented to person, place, and time.  Skin: Skin is warm and dry.  Nursing note and vitals reviewed.    ED Treatments / Results  DIAGNOSTIC STUDIES: Oxygen Saturation is 96% on RA, NL by my interpretation.    COORDINATION OF CARE: 12:32 AM-Discussed next steps with pt. Pt verbalized understanding and is agreeable with the plan. Will order labs and medication.   Labs (all labs ordered are listed, but only abnormal results are displayed) Labs Reviewed  URINALYSIS, ROUTINE W REFLEX MICROSCOPIC - Abnormal; Notable for the following:       Result Value   Color, Urine AMBER (*)    Specific Gravity, Urine 1.003 (*)    Nitrite POSITIVE (*)    Squamous Epithelial / LPF 0-5 (*)    All other components within normal  limits  CBG MONITORING, ED - Abnormal; Notable for the following:    Glucose-Capillary 141 (*)    All other components within normal limits  URINE CULTURE    EKG  EKG Interpretation None       Radiology No results found.  Procedures Procedures (including critical care time)  Medications Ordered in ED Medications - No data to display   Initial Impression / Assessment and Plan / ED Course  I have reviewed the triage vital signs and the nursing notes.  Pertinent labs & imaging results that were available during my care of the patient were reviewed by me and considered in my medical decision making (see chart  for details).  Clinical Course as of Apr 22 41  Mon Apr 22, 2017  0041 Patient is specifically requesting Cipro. I discussed this is not her typical first-line agent due to both resistance as well as some concerning adverse affect that are possible. However she still wants Cipro. Discussed possible side effects such as muscle or tendon injuries, neuropathies and CNS effects. She is understanding of this and will stop if she develops any symptoms and follow-up closely with her doctor.  [SG]    Clinical Course User Index [SG] Sherwood Gambler, MD    Uncomplicated UTI. No CVA tenderness, vomiting, fevers. F/u closely with PCP.  Final Clinical Impressions(s) / ED Diagnoses   Final diagnoses:  Acute lower UTI (urinary tract infection)    New Prescriptions New Prescriptions   No medications on file   I personally performed the services described in this documentation, which was scribed in my presence. The recorded information has been reviewed and is accurate.    Sherwood Gambler, MD 04/22/17 [phone]

## 2017-04-22 NOTE — Discharge Instructions (Signed)
If you develop any tendon or muscle pain, abnormal nerve sensation or any confusion or alteration in mental status, stop the Cipro and call your doctor immediately

## 2017-04-24 LAB — URINE CULTURE

## 2017-06-13 ENCOUNTER — Ambulatory Visit: Payer: Self-pay | Admitting: Family Medicine

## 2017-07-08 ENCOUNTER — Encounter: Payer: Self-pay | Admitting: Family Medicine

## 2017-07-08 ENCOUNTER — Ambulatory Visit (INDEPENDENT_AMBULATORY_CARE_PROVIDER_SITE_OTHER): Payer: Self-pay | Admitting: Family Medicine

## 2017-07-08 VITALS — BP 124/62 | HR 86 | Temp 98.3°F | Ht 66.0 in | Wt 150.4 lb

## 2017-07-08 DIAGNOSIS — M797 Fibromyalgia: Secondary | ICD-10-CM

## 2017-07-08 DIAGNOSIS — N181 Chronic kidney disease, stage 1: Secondary | ICD-10-CM

## 2017-07-08 DIAGNOSIS — F329 Major depressive disorder, single episode, unspecified: Secondary | ICD-10-CM

## 2017-07-08 DIAGNOSIS — E1122 Type 2 diabetes mellitus with diabetic chronic kidney disease: Secondary | ICD-10-CM

## 2017-07-08 DIAGNOSIS — F32A Depression, unspecified: Secondary | ICD-10-CM

## 2017-07-08 LAB — POCT GLYCOSYLATED HEMOGLOBIN (HGB A1C): HEMOGLOBIN A1C: 6.7

## 2017-07-08 MED ORDER — MELOXICAM 15 MG PO TABS: 15.0000 mg | ORAL_TABLET | Freq: Every day | ORAL | 3 refills | Status: DC

## 2017-07-08 MED ORDER — CITALOPRAM HYDROBROMIDE 40 MG PO TABS: 40.0000 mg | ORAL_TABLET | Freq: Every day | ORAL | 3 refills | Status: DC

## 2017-07-08 MED ORDER — METFORMIN HCL 1000 MG PO TABS: ORAL_TABLET | ORAL | 3 refills | Status: DC

## 2017-07-08 NOTE — Patient Instructions (Addendum)
Sorry about the disability decision. Good luck with work. Try the meloxicam 1/2 tab first.  You decide if better/less expensive than ibuprofen. If you do get a job with insurance, you are behind on your eye exam and colonoscopy.

## 2017-07-09 LAB — BASIC METABOLIC PANEL
BUN/Creatinine Ratio: 16 (ref 9–23)
BUN: 8 mg/dL (ref 6–24)
CALCIUM: 9.8 mg/dL (ref 8.7–10.2)
CHLORIDE: 103 mmol/L (ref 96–106)
CO2: 22 mmol/L (ref 20–29)
Creatinine, Ser: 0.49 mg/dL — ABNORMAL LOW (ref 0.57–1.00)
GFR calc non Af Amer: 112 mL/min/{1.73_m2} (ref >59)
GFR, EST AFRICAN AMERICAN: 129 mL/min/{1.73_m2} (ref >59)
GLUCOSE: 77 mg/dL (ref 65–99)
Potassium: 4.1 mmol/L (ref 3.5–5.2)
Sodium: 139 mmol/L (ref 134–144)

## 2017-07-09 LAB — CBC
Hematocrit: 44.2 % (ref 34.0–46.6)
Hemoglobin: 15.3 g/dL (ref 11.1–15.9)
MCH: 31.4 pg (ref 26.6–33.0)
MCHC: 34.6 g/dL (ref 31.5–35.7)
MCV: 91 fL (ref 79–97)
PLATELETS: 249 10*3/uL (ref 150–379)
RBC: 4.88 x10E6/uL (ref 3.77–5.28)
RDW: 13.2 % (ref 12.3–15.4)
WBC: 9.2 10*3/uL (ref 3.4–10.8)

## 2017-07-09 NOTE — Assessment & Plan Note (Signed)
Check CBC and creat before OKing use of NSAIDs

## 2017-07-09 NOTE — Assessment & Plan Note (Signed)
>>  ASSESSMENT AND PLAN FOR DM (DIABETES MELLITUS), TYPE 2 WITH RENAL COMPLICATIONS ([facility]) WRITTEN ON 07/09/2017  3:13 PM BY HENSEL, Santiago Bumpers, MD  She has CKD stage 1 (microalbumin and normal creat)  Still OK to use NSAID.  Check creat again.

## 2017-07-09 NOTE — Progress Notes (Signed)
   Subjective:    Patient ID: Makayla Owens, female    DOB: June 13, 1961, 56 y.o.   MRN: [id number]  HPI Patient for follow up of fibromyalgia.  Specifically, she had applied for disability, was initially turned down and then lost her appeal.  She has very considerable financial constraints, no insurance and feels forced to go back to work despite her ongoing pain.  Pain is mostly in the upper back area with some also in lower back.  She and I are both comfortable with the diagnosis of fibromyalgia.   Biggest single question is can she use NSAIDs (currently using ibuprofen 600 mg ~3 times per day) even though she has diabetes and is microalbumin positive.  She does not have peptic ulcer disease by history.  She does have know gallstones causing episodic pain and, on one occasion, pancreatitis.    Denies heartburn/indigestion.  A1C today is at goal at 6.7    Because of financial constraints, she is behind on multiple areas of health maint.   Review of Systems     Objective:   Physical Exam Multiple areas of point tenderness around the shoulder girdle.  Good ROM of shoulders Lungs clear Cardiac RRR without m or g        Assessment & Plan:

## 2017-07-09 NOTE — Assessment & Plan Note (Addendum)
She has CKD stage 1 (microalbumin and normal creat)  Still OK to use NSAID.  Check creat again.

## 2017-07-30 ENCOUNTER — Emergency Department (HOSPITAL_COMMUNITY)
Admission: EM | Admit: 2017-07-30 | Discharge: 2017-07-30 | Disposition: A | Discharge status: Home / Self Care | Payer: Self-pay | Attending: Emergency Medicine | Admitting: Emergency Medicine

## 2017-07-30 ENCOUNTER — Encounter (HOSPITAL_COMMUNITY): Payer: Self-pay | Admitting: *Deleted

## 2017-07-30 DIAGNOSIS — Z791 Long term (current) use of non-steroidal anti-inflammatories (NSAID): Secondary | ICD-10-CM | POA: Insufficient documentation

## 2017-07-30 DIAGNOSIS — E1122 Type 2 diabetes mellitus with diabetic chronic kidney disease: Secondary | ICD-10-CM | POA: Insufficient documentation

## 2017-07-30 DIAGNOSIS — Z79899 Other long term (current) drug therapy: Secondary | ICD-10-CM | POA: Insufficient documentation

## 2017-07-30 DIAGNOSIS — N3000 Acute cystitis without hematuria: Secondary | ICD-10-CM | POA: Insufficient documentation

## 2017-07-30 DIAGNOSIS — Z853 Personal history of malignant neoplasm of breast: Secondary | ICD-10-CM | POA: Insufficient documentation

## 2017-07-30 DIAGNOSIS — N181 Chronic kidney disease, stage 1: Secondary | ICD-10-CM | POA: Insufficient documentation

## 2017-07-30 DIAGNOSIS — Z7984 Long term (current) use of oral hypoglycemic drugs: Secondary | ICD-10-CM | POA: Insufficient documentation

## 2017-07-30 DIAGNOSIS — R300 Dysuria: Secondary | ICD-10-CM | POA: Insufficient documentation

## 2017-07-30 DIAGNOSIS — Z87891 Personal history of nicotine dependence: Secondary | ICD-10-CM | POA: Insufficient documentation

## 2017-07-30 LAB — URINALYSIS, ROUTINE W REFLEX MICROSCOPIC
BACTERIA UA: NONE SEEN
BILIRUBIN URINE: NEGATIVE
Glucose, UA: 50 mg/dL — AB
KETONES UR: NEGATIVE mg/dL
NITRITE: NEGATIVE
PH: 6 (ref 5.0–8.0)
Protein, ur: NEGATIVE mg/dL
SPECIFIC GRAVITY, URINE: 1.003 — AB (ref 1.005–1.030)

## 2017-07-30 MED ORDER — CEPHALEXIN 500 MG PO CAPS
500.0000 mg | ORAL_CAPSULE | Freq: Once | ORAL | Status: AC
  Administered 2017-07-30: 500 mg via ORAL
  Filled 2017-07-30: qty 1

## 2017-07-30 MED ORDER — PHENAZOPYRIDINE HCL 100 MG PO TABS
100.0000 mg | ORAL_TABLET | Freq: Once | ORAL | Status: AC
  Administered 2017-07-30: 100 mg via ORAL
  Filled 2017-07-30: qty 1

## 2017-07-30 MED ORDER — CEPHALEXIN 500 MG PO CAPS: 500.0000 mg | ORAL_CAPSULE | Freq: Three times a day (TID) | ORAL | 0 refills | Status: DC

## 2017-07-30 NOTE — ED Provider Notes (Signed)
[facility] DEPT Provider Note   CSN: [id number] Arrival date & time: 07/30/17  0867     History   Chief Complaint Chief Complaint  Patient presents with  . Dysuria    HPI Makayla Owens is a 56 y.o. female.  HPI  This a 56 year old female who presents with dysuria. Patient reports history of frequent urinary tract infections. She reports onset of dysuria Sunday morning; However, she had some improvement.  Tonight she woke up at midnight with urgency and dysuria. She reports lower abdominal discomfort. No lateralizing back pain or fevers. The symptoms are classic for her urinary tract infections.  Past Medical History:  Diagnosis Date  . Breast cancer ([place])   . Depression   . Diabetes mellitus   . Gall bladder stones   . Migraines     Patient Active Problem List   Diagnosis Date Noted  . CKD stage 1 due to type 2 diabetes mellitus ([place]) 09/28/2015  . Need for hepatitis C screening test 06/03/2015  . Gallstone pancreatitis 09/16/2014  . Right carpal tunnel syndrome 07/30/2014  . Fibromyalgia syndrome 07/18/2012  . NECK PAIN 05/19/2010  . Calculus of gallbladder 05/05/2010  . Stopped smoking 09/10/2009  . DM (diabetes mellitus), type 2 with renal complications ([place]) 61/95/0932  . HYPERCHOLESTEROLEMIA 04/27/2009  . Depression 04/27/2009  . Breast cancer in female [facility]) 04/27/2009    Past Surgical History:  Procedure Laterality Date  . BREAST SURGERY    . MASTECTOMY     Bilateral  . MASTECTOMY Bilateral     OB History    Gravida Para Term Preterm AB Living   2 1 1   1 1    SAB TAB Ectopic Multiple Live Births   1               Home Medications    Prior to Admission medications   Medication Sig Start Date End Date Taking? Authorizing Provider  cephALEXin (KEFLEX) 500 MG capsule Take 1 capsule (500 mg total) by mouth 3 (three) times daily. 07/30/17   Douglass Dunshee, Barbette Hair, MD  citalopram (CELEXA) 40 MG tablet Take 1 tablet (40 mg total) by mouth daily.  07/08/17   Zenia Resides, MD  losartan (COZAAR) 50 MG tablet Take 1 tablet (50 mg total) by mouth daily. 11/12/14   Zenia Resides, MD  meloxicam (MOBIC) 15 MG tablet Take 1 tablet (15 mg total) by mouth daily. 07/08/17   Zenia Resides, MD  metFORMIN (GLUCOPHAGE) 1000 MG tablet TAKE 1 TABLET BY MOUTH TWICE DAILY WITH A MEAL 07/08/17   Hensel, Jamal Collin, MD  Multiple Vitamin (MULTIVITAMIN WITH MINERALS) TABS tablet Take 1 tablet by mouth daily.    [provider]  vitamin C (ASCORBIC ACID) 500 MG tablet Take 500 mg by mouth daily.    [provider]    Family History Family History  Problem Relation Age of Onset  . Cancer Mother   . Heart failure Mother   . Diabetes Father   . Heart failure Father   . Cancer Father   . Stroke Father     Social History Social History  Substance Use Topics  . Smoking status: Former Smoker    Packs/day: 0.25    Years: 17.00    Types: Cigarettes    Quit date: 03/04/2015  . Smokeless tobacco: Never Used  . Alcohol use No     Allergies   Nitrofurantoin; Elavil [amitriptyline]; Lipitor [atorvastatin]; Morphine and related; Other; and Statins  Review of Systems Review of Systems  Constitutional: Negative for fever.  Respiratory: Negative for shortness of breath.   Cardiovascular: Negative for chest pain.  Gastrointestinal: Negative for abdominal pain, nausea and vomiting.  Genitourinary: Positive for dysuria and urgency. Negative for flank pain and pelvic pain.  Musculoskeletal: Negative for neck pain.  All other systems reviewed and are negative.    Physical Exam Updated Vital Signs BP 115/81 (BP Location: Right Arm)   Pulse 74   Temp (!) 97.5 F (36.4 C) (Oral)   Resp 18   Ht 5' 6.5" (1.689 m)   Wt 68 kg (150 lb)   SpO2 98%   BMI 23.85 kg/m   Physical Exam  Constitutional: She is oriented to person, place, and time. She appears well-developed and well-nourished. No distress.  HENT:  Head: Normocephalic  and atraumatic.  Cardiovascular: Normal rate, regular rhythm and normal heart sounds.   Pulmonary/Chest: Effort normal and breath sounds normal. No respiratory distress. She has no wheezes.  Abdominal: Soft. Bowel sounds are normal. There is no rebound and no guarding.  Mild suprapubic tenderness palpation, no rebound  Neurological: She is alert and oriented to person, place, and time.  Skin: Skin is warm and dry.  Psychiatric: She has a normal mood and affect.  Nursing note and vitals reviewed.    ED Treatments / Results  Labs (all labs ordered are listed, but only abnormal results are displayed) Labs Reviewed  URINALYSIS, ROUTINE W REFLEX MICROSCOPIC - Abnormal; Notable for the following:       Result Value   Color, Urine STRAW (*)    Specific Gravity, Urine 1.003 (*)    Glucose, UA 50 (*)    Hgb urine dipstick SMALL (*)    Leukocytes, UA MODERATE (*)    Squamous Epithelial / LPF 0-5 (*)    All other components within normal limits  URINE CULTURE    EKG  EKG Interpretation None       Radiology No results found.  Procedures Procedures (including critical care time)  Medications Ordered in ED Medications  phenazopyridine (PYRIDIUM) tablet 100 mg (100 mg Oral Given 07/30/17 0533)  cephALEXin (KEFLEX) capsule 500 mg (500 mg Oral Given 07/30/17 0533)     Initial Impression / Assessment and Plan / ED Course  I have reviewed the triage vital signs and the nursing notes.  Pertinent labs & imaging results that were available during my care of the patient were reviewed by me and considered in my medical decision making (see chart for details).    Patient presents with symptoms classic for her urinary tract infections. She is nontoxic-appearing on exam. Afebrile and vital signs otherwise reassuring.she has moderate leukocytes esterase, 6-30 white cells, no bacteria. However, given the classic nature of her symptoms, feel it reasonable to treat for uncomplicated cystitis.  Will send urine culture.  After history, exam, and medical workup I feel the patient has been appropriately medically screened and is safe for discharge home. Pertinent diagnoses were discussed with the patient. Patient was given return precautions.   Final Clinical Impressions(s) / ED Diagnoses   Final diagnoses:  Dysuria  Acute cystitis without hematuria    New Prescriptions New Prescriptions   CEPHALEXIN (KEFLEX) 500 MG CAPSULE    Take 1 capsule (500 mg total) by mouth 3 (three) times daily.     Merryl Hacker, MD 07/30/17 [phone]

## 2017-07-30 NOTE — Discharge Instructions (Signed)
If you develop fevers, worsening pain, you should be reevaluated.

## 2017-07-30 NOTE — ED Triage Notes (Signed)
Pt c/o burning and urinary urgency since yesterday

## 2017-07-30 NOTE — ED Notes (Signed)
Pt alert & oriented x4, stable gait. Patient given discharge instructions, paperwork & prescription(s). Patient  instructed to stop at the registration desk to finish any additional paperwork. Patient verbalized understanding. Pt left department w/ no further questions. 

## 2017-07-31 LAB — URINE CULTURE: Culture: NO GROWTH

## 2017-09-07 ENCOUNTER — Encounter (HOSPITAL_COMMUNITY): Payer: Self-pay | Admitting: *Deleted

## 2017-09-07 ENCOUNTER — Emergency Department (HOSPITAL_COMMUNITY)
Admission: EM | Admit: 2017-09-07 | Discharge: 2017-09-08 | Disposition: A | Discharge status: Home / Self Care | Payer: Self-pay | Attending: Emergency Medicine | Admitting: Emergency Medicine

## 2017-09-07 DIAGNOSIS — R21 Rash and other nonspecific skin eruption: Secondary | ICD-10-CM | POA: Insufficient documentation

## 2017-09-07 DIAGNOSIS — N181 Chronic kidney disease, stage 1: Secondary | ICD-10-CM | POA: Insufficient documentation

## 2017-09-07 DIAGNOSIS — Z87891 Personal history of nicotine dependence: Secondary | ICD-10-CM | POA: Insufficient documentation

## 2017-09-07 DIAGNOSIS — E1122 Type 2 diabetes mellitus with diabetic chronic kidney disease: Secondary | ICD-10-CM | POA: Insufficient documentation

## 2017-09-07 DIAGNOSIS — R300 Dysuria: Secondary | ICD-10-CM | POA: Insufficient documentation

## 2017-09-07 DIAGNOSIS — Z9013 Acquired absence of bilateral breasts and nipples: Secondary | ICD-10-CM | POA: Insufficient documentation

## 2017-09-07 DIAGNOSIS — Z853 Personal history of malignant neoplasm of breast: Secondary | ICD-10-CM | POA: Insufficient documentation

## 2017-09-07 DIAGNOSIS — Z7984 Long term (current) use of oral hypoglycemic drugs: Secondary | ICD-10-CM | POA: Insufficient documentation

## 2017-09-07 DIAGNOSIS — Z79899 Other long term (current) drug therapy: Secondary | ICD-10-CM | POA: Insufficient documentation

## 2017-09-07 LAB — URINALYSIS, ROUTINE W REFLEX MICROSCOPIC
BILIRUBIN URINE: NEGATIVE
Glucose, UA: NEGATIVE mg/dL
HGB URINE DIPSTICK: NEGATIVE
Ketones, ur: NEGATIVE mg/dL
Leukocytes, UA: NEGATIVE
Nitrite: NEGATIVE
PROTEIN: NEGATIVE mg/dL
SPECIFIC GRAVITY, URINE: 1.001 — AB (ref 1.005–1.030)
pH: 6 (ref 5.0–8.0)

## 2017-09-07 NOTE — ED Triage Notes (Signed)
Pt reports feeling as if she is getting another uti. Pt reports suprapubic pain and pressure, dysuria, and urgency. Pt also c/o rash under her breast and on her left arm.

## 2017-09-08 MED ORDER — PHENAZOPYRIDINE HCL 200 MG PO TABS: 200.0000 mg | ORAL_TABLET | Freq: Three times a day (TID) | ORAL | 0 refills | Status: DC

## 2017-09-08 NOTE — Discharge Instructions (Signed)
Take the pyridium for urinary discomfort. If you are still having symptoms this week, let Dr Andria Frames know and he may want to repeat the urinalysis. Use benadryl cream on the rash, let Dr Andria Frames know if it starts to develop dark blisters, that would be shingles.
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
## 2018-06-08 ENCOUNTER — Encounter (HOSPITAL_COMMUNITY): Payer: Self-pay | Admitting: Emergency Medicine

## 2018-06-08 ENCOUNTER — Emergency Department (HOSPITAL_COMMUNITY)
Admission: EM | Admit: 2018-06-08 | Discharge: 2018-06-08 | Disposition: A | Discharge status: Home / Self Care | Payer: Self-pay | Attending: Emergency Medicine | Admitting: Emergency Medicine

## 2018-06-08 ENCOUNTER — Other Ambulatory Visit: Payer: Self-pay

## 2018-06-08 DIAGNOSIS — Z9013 Acquired absence of bilateral breasts and nipples: Secondary | ICD-10-CM | POA: Insufficient documentation

## 2018-06-08 DIAGNOSIS — Z853 Personal history of malignant neoplasm of breast: Secondary | ICD-10-CM | POA: Insufficient documentation

## 2018-06-08 DIAGNOSIS — Z7984 Long term (current) use of oral hypoglycemic drugs: Secondary | ICD-10-CM | POA: Insufficient documentation

## 2018-06-08 DIAGNOSIS — E1122 Type 2 diabetes mellitus with diabetic chronic kidney disease: Secondary | ICD-10-CM | POA: Insufficient documentation

## 2018-06-08 DIAGNOSIS — N181 Chronic kidney disease, stage 1: Secondary | ICD-10-CM | POA: Insufficient documentation

## 2018-06-08 DIAGNOSIS — E119 Type 2 diabetes mellitus without complications: Secondary | ICD-10-CM | POA: Insufficient documentation

## 2018-06-08 DIAGNOSIS — R1031 Right lower quadrant pain: Secondary | ICD-10-CM | POA: Insufficient documentation

## 2018-06-08 DIAGNOSIS — Z79899 Other long term (current) drug therapy: Secondary | ICD-10-CM | POA: Insufficient documentation

## 2018-06-08 DIAGNOSIS — Z87891 Personal history of nicotine dependence: Secondary | ICD-10-CM | POA: Insufficient documentation

## 2018-06-08 LAB — CBC WITH DIFFERENTIAL/PLATELET
BASOS ABS: 0.1 10*3/uL (ref 0.0–0.1)
Basophils Relative: 1 %
EOS ABS: 0.2 10*3/uL (ref 0.0–0.7)
EOS PCT: 2 %
HCT: 44 % (ref 36.0–46.0)
Hemoglobin: 14.9 g/dL (ref 12.0–15.0)
Lymphocytes Relative: 29 %
Lymphs Abs: 2.5 10*3/uL (ref 0.7–4.0)
MCH: 31.6 pg (ref 26.0–34.0)
MCHC: 33.9 g/dL (ref 30.0–36.0)
MCV: 93.4 fL (ref 78.0–100.0)
Monocytes Absolute: 0.5 10*3/uL (ref 0.1–1.0)
Monocytes Relative: 6 %
Neutro Abs: 5.6 10*3/uL (ref 1.7–7.7)
Neutrophils Relative %: 62 %
Platelets: 213 10*3/uL (ref 150–400)
RBC: 4.71 MIL/uL (ref 3.87–5.11)
RDW: 13.7 % (ref 11.5–15.5)
WBC: 8.8 10*3/uL (ref 4.0–10.5)

## 2018-06-08 LAB — COMPREHENSIVE METABOLIC PANEL
ALK PHOS: 71 U/L (ref 38–126)
ALT: 13 U/L (ref 0–44)
AST: 13 U/L — ABNORMAL LOW (ref 15–41)
Albumin: 3.7 g/dL (ref 3.5–5.0)
Anion gap: 7 (ref 5–15)
BUN: 11 mg/dL (ref 6–20)
CALCIUM: 9 mg/dL (ref 8.9–10.3)
CO2: 25 mmol/L (ref 22–32)
CREATININE: 0.59 mg/dL (ref 0.44–1.00)
Chloride: 106 mmol/L (ref 98–111)
Glucose, Bld: 107 mg/dL — ABNORMAL HIGH (ref 70–99)
Potassium: 4.4 mmol/L (ref 3.5–5.1)
Sodium: 138 mmol/L (ref 135–145)
Total Bilirubin: 0.5 mg/dL (ref 0.3–1.2)
Total Protein: 6.3 g/dL — ABNORMAL LOW (ref 6.5–8.1)

## 2018-06-08 LAB — URINALYSIS, ROUTINE W REFLEX MICROSCOPIC
Bilirubin Urine: NEGATIVE
Glucose, UA: NEGATIVE mg/dL
HGB URINE DIPSTICK: NEGATIVE
KETONES UR: NEGATIVE mg/dL
LEUKOCYTES UA: NEGATIVE
Nitrite: NEGATIVE
Protein, ur: NEGATIVE mg/dL
Specific Gravity, Urine: 1.006 (ref 1.005–1.030)
pH: 6 (ref 5.0–8.0)

## 2018-06-08 LAB — I-STAT BETA HCG BLOOD, ED (MC, WL, AP ONLY): I-stat hCG, quantitative: <5 m[IU]/mL (ref <5)

## 2018-06-08 LAB — LIPASE, BLOOD: LIPASE: 46 U/L (ref 11–51)

## 2018-06-08 MED ORDER — FENTANYL CITRATE (PF) 100 MCG/2ML IJ SOLN
50.0000 ug | Freq: Once | INTRAMUSCULAR | Status: AC
  Administered 2018-06-08: 50 ug via INTRAVENOUS
  Filled 2018-06-08: qty 2

## 2018-06-08 MED ORDER — PROMETHAZINE HCL 25 MG/ML IJ SOLN
12.5000 mg | Freq: Once | INTRAMUSCULAR | Status: AC
  Administered 2018-06-08: 12.5 mg via INTRAVENOUS
  Filled 2018-06-08: qty 1

## 2018-06-08 MED ORDER — SODIUM CHLORIDE 0.9 % IV SOLN: INTRAVENOUS | Status: DC

## 2018-06-08 MED ORDER — ONDANSETRON HCL 4 MG/2ML IJ SOLN
4.0000 mg | Freq: Once | INTRAMUSCULAR | Status: DC
  Filled 2018-06-08: qty 2

## 2018-06-08 MED ORDER — SODIUM CHLORIDE 0.9 % IV BOLUS
1000.0000 mL | Freq: Once | INTRAVENOUS | Status: AC
  Administered 2018-06-08: 1000 mL via INTRAVENOUS

## 2018-06-08 NOTE — ED Triage Notes (Signed)
Pt reports she has been having abd pain n/v/d for several months intermittently.  States she began having more constant right lower quad pain this morning with vomiting x2.

## 2018-06-08 NOTE — Discharge Instructions (Addendum)
Return to the emergency room for fever, worsening pain, inability to eat, or other worsening concerning symptoms.  Follow up with a primary care doctor if you continue to have episodes of pain

## 2018-06-08 NOTE — ED Provider Notes (Signed)
[facility] EMERGENCY DEPARTMENT Provider Note   CSN: [id number] Arrival date & time: 06/08/18  0730     History   Chief Complaint Chief Complaint  Patient presents with  . Abdominal Pain    HPI Makayla Owens is a 57 y.o. female.  HPI Patient has a history of gallstones.  She has had some intermittent pain in the right flank area for the last couple of months.  Symptoms are not too severe and the patient just attributed them to her gallstones.  This morning however at about 2 AM she woke up with moderate to severe pain in her right lower quadrant.  She had a couple episodes of nausea and vomiting with that.  She continues to feel nauseated.  The pain has been constant in her right lower quadrant and not improving at all.  She denies any dysuria or diarrhea.  No constipation.  No vaginal discharge or bleeding.  Patient states she has not had a menstrual period in years after her chemotherapy treatments for breast cancer Past Medical History:  Diagnosis Date  . Breast cancer ([place])   . Depression   . Diabetes mellitus   . Gall bladder stones   . Migraines     Patient Active Problem List   Diagnosis Date Noted  . CKD stage 1 due to type 2 diabetes mellitus ([place]) 09/28/2015  . Need for hepatitis C screening test 06/03/2015  . Gallstone pancreatitis 09/16/2014  . Right carpal tunnel syndrome 07/30/2014  . Fibromyalgia syndrome 07/18/2012  . NECK PAIN 05/19/2010  . Calculus of gallbladder 05/05/2010  . Stopped smoking 09/10/2009  . DM (diabetes mellitus), type 2 with renal complications ([place]) 84/16/6063  . HYPERCHOLESTEROLEMIA 04/27/2009  . Depression 04/27/2009  . Breast cancer in female [facility]) 04/27/2009    Past Surgical History:  Procedure Laterality Date  . BREAST SURGERY    . MASTECTOMY     Bilateral  . MASTECTOMY Bilateral      OB History    Gravida  2   Para  1   Term  1   Preterm      AB  1   Living  1     SAB  1   TAB      Ectopic      Multiple      Live Births               Home Medications    Prior to Admission medications   Medication Sig Start Date End Date Taking? Authorizing Provider  cephALEXin (KEFLEX) 500 MG capsule Take 1 capsule (500 mg total) by mouth 3 (three) times daily. 07/30/17   Horton, Barbette Hair, MD  citalopram (CELEXA) 40 MG tablet Take 1 tablet (40 mg total) by mouth daily. 07/08/17   Zenia Resides, MD  losartan (COZAAR) 50 MG tablet Take 1 tablet (50 mg total) by mouth daily. 11/12/14   Zenia Resides, MD  meloxicam (MOBIC) 15 MG tablet Take 1 tablet (15 mg total) by mouth daily. 07/08/17   Zenia Resides, MD  metFORMIN (GLUCOPHAGE) 1000 MG tablet TAKE 1 TABLET BY MOUTH TWICE DAILY WITH A MEAL 07/08/17   Hensel, Jamal Collin, MD  Multiple Vitamin (MULTIVITAMIN WITH MINERALS) TABS tablet Take 1 tablet by mouth daily.    [provider]  phenazopyridine (PYRIDIUM) 200 MG tablet Take 1 tablet (200 mg total) by mouth 3 (three) times daily. 09/08/17   Rolland Porter, MD  vitamin C (ASCORBIC ACID) 500 MG  tablet Take 500 mg by mouth daily.    [provider]    Family History Family History  Problem Relation Age of Onset  . Cancer Mother   . Heart failure Mother   . Diabetes Father   . Heart failure Father   . Cancer Father   . Stroke Father     Social History Social History   Tobacco Use  . Smoking status: Former Smoker    Packs/day: 0.25    Years: 17.00    Pack years: 4.25    Types: Cigarettes    Last attempt to quit: 03/04/2015    Years since quitting: 3.2  . Smokeless tobacco: Never Used  Substance Use Topics  . Alcohol use: No    Alcohol/week: 0.0 oz  . Drug use: No     Allergies   Nitrofurantoin; Elavil [amitriptyline]; Lipitor [atorvastatin]; Morphine and related; Other; Statins; and Zofran [ondansetron hcl]   Review of Systems Review of Systems  All other systems reviewed and are negative.    Physical Exam Updated Vital Signs BP 130/81 (BP  Location: Left Arm)   Pulse 83   Temp 98.1 F (36.7 C) (Oral)   Resp 18   Ht 1.676 m (5\' 6" )   Wt 68 kg (150 lb)   SpO2 97%   BMI 24.21 kg/m   Physical Exam  Constitutional: She appears well-developed and well-nourished. No distress.  HENT:  Head: Normocephalic and atraumatic.  Right Ear: External ear normal.  Left Ear: External ear normal.  Eyes: Conjunctivae are normal. Right eye exhibits no discharge. Left eye exhibits no discharge. No scleral icterus.  Neck: Neck supple. No tracheal deviation present.  Cardiovascular: Normal rate, regular rhythm and intact distal pulses.  Pulmonary/Chest: Effort normal and breath sounds normal. No stridor. No respiratory distress. She has no wheezes. She has no rales.  Abdominal: Soft. Bowel sounds are normal. She exhibits no distension. There is tenderness in the right lower quadrant. There is no rigidity, no rebound and no guarding. No hernia.  Musculoskeletal: She exhibits no edema or tenderness.  Neurological: She is alert. She has normal strength. No cranial nerve deficit (no facial droop, extraocular movements intact, no slurred speech) or sensory deficit. She exhibits normal muscle tone. She displays no seizure activity. Coordination normal.  Skin: Skin is warm and dry. No rash noted.  Psychiatric: She has a normal mood and affect.  Nursing note and vitals reviewed.    ED Treatments / Results  Labs (all labs ordered are listed, but only abnormal results are displayed) Labs Reviewed  COMPREHENSIVE METABOLIC PANEL - Abnormal; Notable for the following components:      Result Value   Glucose, Bld 107 (*)    Total Protein 6.3 (*)    AST 13 (*)    All other components within normal limits  URINALYSIS, ROUTINE W REFLEX MICROSCOPIC - Abnormal; Notable for the following components:   Color, Urine STRAW (*)    All other components within normal limits  LIPASE, BLOOD  CBC WITH DIFFERENTIAL/PLATELET  I-STAT BETA HCG BLOOD, ED ([facility], [facility], [facility]  ONLY)    EKG None  Radiology No results found.  Procedures Procedures (including critical care time)  Medications Ordered in ED Medications  sodium chloride 0.9 % bolus 1,000 mL (1,000 mLs Intravenous New Bag/Given 06/08/18 0837)    And  0.9 %  sodium chloride infusion (has no administration in time range)  ondansetron (ZOFRAN) injection 4 mg (4 mg Intravenous Not Given 06/08/18 0837)  fentaNYL (SUBLIMAZE) injection 50 mcg (50 mcg Intravenous Given 06/08/18 0837)  promethazine (PHENERGAN) injection 12.5 mg (12.5 mg Intravenous Given 06/08/18 0837)     Initial Impression / Assessment and Plan / ED Course  I have reviewed the triage vital signs and the nursing notes.  Pertinent labs & imaging results that were available during my care of the patient were reviewed by me and considered in my medical decision making (see chart for details).  Clinical Course as of Jun 08 908  Sun Jun 08, 2018  0851 Labs unremarkable.  Discussed doing further evaluation with the patient, CT scan.  Pt asked if it is absolutely necessary, she is concerned about cost.  She wonders if she could have pulled a muscle.  Certainly a possibility.  Labs are reassuring.  She does not have peritoneal signs.     [JK]    Clinical Course User Index [JK] Dorie Rank, MD    Patient presented to the emergency room for abdominal pain.  Patient had tenderness palpation the right lower quadrant without any rebound or guarding.  Laboratory testing is reassuring.  Discussed option of  a CT scan to evaluate further.  Patient would prefer not to have the CT scan due to cost unless absolutely necessary.  I explained the patient overall my suspicion is low for acute appendicitis or other emergent condition.  I certainly think it is reasonable to not do that today.  We discussed warning signs and precautions that should prompt her to return to the emergency room.  Final Clinical Impressions(s) / ED Diagnoses   Final diagnoses:  Right  lower quadrant abdominal pain    ED Discharge Orders    None       Dorie Rank, MD 06/08/18 [phone]

## 2018-07-31 ENCOUNTER — Other Ambulatory Visit: Payer: Self-pay | Admitting: Family Medicine

## 2018-07-31 DIAGNOSIS — F329 Major depressive disorder, single episode, unspecified: Secondary | ICD-10-CM

## 2018-07-31 DIAGNOSIS — N181 Chronic kidney disease, stage 1: Principal | ICD-10-CM

## 2018-07-31 DIAGNOSIS — F32A Depression, unspecified: Secondary | ICD-10-CM

## 2018-07-31 DIAGNOSIS — E1122 Type 2 diabetes mellitus with diabetic chronic kidney disease: Secondary | ICD-10-CM

## 2018-08-05 ENCOUNTER — Ambulatory Visit: Payer: Self-pay | Admitting: Family Medicine

## 2018-08-21 ENCOUNTER — Ambulatory Visit: Payer: Self-pay | Admitting: Family Medicine

## 2018-09-18 ENCOUNTER — Ambulatory Visit: Payer: Self-pay | Admitting: Family Medicine

## 2018-10-07 ENCOUNTER — Telehealth: Payer: Self-pay | Admitting: Family Medicine

## 2018-10-07 NOTE — Telephone Encounter (Signed)
Attempted to contact pt to remind them of their upcoming appt tomorrow 10/08/2018. -CH °

## 2018-10-08 ENCOUNTER — Other Ambulatory Visit: Payer: Self-pay

## 2018-10-08 ENCOUNTER — Ambulatory Visit (INDEPENDENT_AMBULATORY_CARE_PROVIDER_SITE_OTHER): Payer: Self-pay | Admitting: Family Medicine

## 2018-10-08 ENCOUNTER — Encounter: Payer: Self-pay | Admitting: Family Medicine

## 2018-10-08 DIAGNOSIS — E7800 Pure hypercholesterolemia, unspecified: Secondary | ICD-10-CM

## 2018-10-08 DIAGNOSIS — E1122 Type 2 diabetes mellitus with diabetic chronic kidney disease: Secondary | ICD-10-CM

## 2018-10-08 DIAGNOSIS — F329 Major depressive disorder, single episode, unspecified: Secondary | ICD-10-CM

## 2018-10-08 DIAGNOSIS — F32A Depression, unspecified: Secondary | ICD-10-CM

## 2018-10-08 DIAGNOSIS — N181 Chronic kidney disease, stage 1: Secondary | ICD-10-CM

## 2018-10-08 LAB — POCT GLYCOSYLATED HEMOGLOBIN (HGB A1C): HbA1c, POC (controlled diabetic range): 6.9 % (ref 0.0–7.0)

## 2018-10-08 MED ORDER — CITALOPRAM HYDROBROMIDE 40 MG PO TABS: 40.0000 mg | ORAL_TABLET | Freq: Every day | ORAL | 3 refills | Status: DC

## 2018-10-08 MED ORDER — ROSUVASTATIN CALCIUM 10 MG PO TABS: 10.0000 mg | ORAL_TABLET | Freq: Every day | ORAL | 3 refills | Status: DC

## 2018-10-08 MED ORDER — ASPIRIN EC 81 MG PO TBEC
81.0000 mg | DELAYED_RELEASE_TABLET | Freq: Every day | ORAL | Status: DC
Start: 2018-10-08 — End: 2022-07-09

## 2018-10-08 MED ORDER — LOSARTAN POTASSIUM 25 MG PO TABS: 25.0000 mg | ORAL_TABLET | Freq: Every day | ORAL | 6 refills | Status: DC

## 2018-10-08 MED ORDER — METFORMIN HCL 1000 MG PO TABS: ORAL_TABLET | ORAL | 3 refills | Status: DC

## 2018-10-08 NOTE — Patient Instructions (Signed)
Given your strong family history, you need to be on an aspirin and statin Also the losartan will help prevent kidney damage from diabetes. I hope one day you get insurance.  The two big things you need are an eye exam and a colonoscopy.

## 2018-10-09 ENCOUNTER — Encounter: Payer: Self-pay | Admitting: Family Medicine

## 2018-10-09 ENCOUNTER — Telehealth: Payer: Self-pay

## 2018-10-09 MED ORDER — PRAVASTATIN SODIUM 40 MG PO TABS
40.0000 mg | ORAL_TABLET | Freq: Every day | ORAL | 3 refills | Status: DC
Start: 2018-10-09 — End: 2019-03-30

## 2018-10-09 NOTE — Telephone Encounter (Signed)
Switched to pravastatin.

## 2018-10-09 NOTE — Assessment & Plan Note (Signed)
A1C at goal.  Restart ARB for renal protection.

## 2018-10-09 NOTE — Progress Notes (Signed)
   Subjective:    Patient ID: Makayla Owens, female    DOB: 12-Dec-1960, 57 y.o.   MRN: [id number]  HPI Multiple issues: 1. Still no insurance.  Cost is big issue. 2. DM: A1C is at goal.  She is not taking her ARB - did not fully understand renal protection 3. High cholesterol and strong family hx of CAD.  Not currently on ASA or statin. 4. Depression stable, needs refill on celexa.   5. Needs both colonoscopy and diabetic eye exam.  $ barrier.    Review of Systems     Objective:   Physical Exam  Lungs clear Cardiac RRR without m or g       Assessment & Plan:

## 2018-10-09 NOTE — Telephone Encounter (Signed)
Patient mother called to report that Rosuvastatin cost is $209. Is there a cheaper alternative that can be prescribed?  Call back is [phone]  Danley Danker, RN [facility])

## 2018-10-09 NOTE — Assessment & Plan Note (Signed)
Strong FHx ups risk of CAD.  Start pravastatin and aspirin.

## 2018-10-09 NOTE — Assessment & Plan Note (Signed)
>>  ASSESSMENT AND PLAN FOR DM (DIABETES MELLITUS), TYPE 2 WITH RENAL COMPLICATIONS ([facility]) WRITTEN ON 10/09/2018  2:13 PM BY HENSEL, WILLIAM A, MD  A1C at goal.  Restart ARB for renal protection.

## 2018-10-09 NOTE — Assessment & Plan Note (Signed)
Stable on current meds 

## 2019-03-26 ENCOUNTER — Telehealth (INDEPENDENT_AMBULATORY_CARE_PROVIDER_SITE_OTHER): Payer: Self-pay | Admitting: Family Medicine

## 2019-03-26 ENCOUNTER — Emergency Department (HOSPITAL_COMMUNITY)
Admission: EM | Admit: 2019-03-26 | Discharge: 2019-03-27 | Disposition: A | Discharge status: Home / Self Care | Payer: Self-pay | Attending: Emergency Medicine | Admitting: Emergency Medicine

## 2019-03-26 ENCOUNTER — Encounter: Payer: Self-pay | Admitting: Family Medicine

## 2019-03-26 ENCOUNTER — Encounter (HOSPITAL_COMMUNITY): Payer: Self-pay

## 2019-03-26 ENCOUNTER — Other Ambulatory Visit: Payer: Self-pay

## 2019-03-26 DIAGNOSIS — N181 Chronic kidney disease, stage 1: Secondary | ICD-10-CM | POA: Insufficient documentation

## 2019-03-26 DIAGNOSIS — Z9013 Acquired absence of bilateral breasts and nipples: Secondary | ICD-10-CM | POA: Insufficient documentation

## 2019-03-26 DIAGNOSIS — Z7984 Long term (current) use of oral hypoglycemic drugs: Secondary | ICD-10-CM | POA: Insufficient documentation

## 2019-03-26 DIAGNOSIS — Z7982 Long term (current) use of aspirin: Secondary | ICD-10-CM | POA: Insufficient documentation

## 2019-03-26 DIAGNOSIS — R399 Unspecified symptoms and signs involving the genitourinary system: Secondary | ICD-10-CM

## 2019-03-26 DIAGNOSIS — Z79899 Other long term (current) drug therapy: Secondary | ICD-10-CM | POA: Insufficient documentation

## 2019-03-26 DIAGNOSIS — E1122 Type 2 diabetes mellitus with diabetic chronic kidney disease: Secondary | ICD-10-CM | POA: Insufficient documentation

## 2019-03-26 DIAGNOSIS — N3000 Acute cystitis without hematuria: Secondary | ICD-10-CM

## 2019-03-26 DIAGNOSIS — Z853 Personal history of malignant neoplasm of breast: Secondary | ICD-10-CM | POA: Insufficient documentation

## 2019-03-26 LAB — CBG MONITORING, ED: Glucose-Capillary: 106 mg/dL — ABNORMAL HIGH (ref 70–99)

## 2019-03-26 MED ORDER — STERILE WATER FOR INJECTION IJ SOLN
INTRAMUSCULAR | Status: AC
  Administered 2019-03-26: 2.1 mL
  Filled 2019-03-26: qty 10

## 2019-03-26 MED ORDER — CEFTRIAXONE SODIUM 1 G IJ SOLR
1.0000 g | Freq: Once | INTRAMUSCULAR | Status: AC
  Administered 2019-03-26: 1 g via INTRAMUSCULAR
  Filled 2019-03-26: qty 10

## 2019-03-26 NOTE — ED Triage Notes (Signed)
Urinary frequency, burning with urination x 2 days

## 2019-03-26 NOTE — Progress Notes (Signed)
[facility] Telemedicine Visit  Patient consented to have virtual visit. Method of visit: Telephone  Encounter participants: Patient: Makayla Owens - located at home Provider: Andrena Mews - located at office Others (if applicable): NA  Chief Complaint: Dysuria  HPI:  Urinary Tract Infection   This is a new problem. The current episode started yesterday. The problem occurs every urination. The problem has been gradually worsening. The quality of the pain is described as burning. The pain is at a severity of 10/10. The pain is severe. There has been no fever. The fever has been present for less than 1 day. History of pyelonephritis: Had UTI in the past, this is similar to previous episodes. Associated symptoms include frequency. Pertinent negatives include no discharge, flank pain, hematuria, nausea, urgency or vomiting. Associated symptoms comments: Suprapubic pain with urination only. Treatments tried: Pyridium OTC. The treatment provided mild relief. Her past medical history is significant for recurrent UTIs.  She leaves in [place] and she is unable to come in. She just completed antibiotic (Amoxycillin 500 mg TID for dental procedure) two days ago.    ROS: per HPI  Pertinent PMHx: Problem list reviewed  Exam:  Respiratory: No resp distress  Assessment/Plan:  UTI symptoms. As discussed with her, it is unlikely she has a UTI after completing a seven-day course of Amoxycillin, or perhaps her UTI is resistant to Amox/ Penicillin group. Either way, she will benefit from getting a urine culture done to confirm UTI or assess for resistance to Amox while I treat empirically with a different A/B. Initially, she stated that she couldn't come in since she lives far away. However, after explaining to her that she might be having something else other than UTI or maybe A/B resistant UTI, she said she will try to come to the lab to get a urine culture done. I asked  if I can schedule a lab appointment for this afternoon, then she hanged up on me. I will forward the note to her PCP to follow-up with her.   Time spent during visit with patient: 10 minutes

## 2019-03-27 LAB — URINALYSIS, ROUTINE W REFLEX MICROSCOPIC
Bilirubin Urine: NEGATIVE
Glucose, UA: NEGATIVE mg/dL
Ketones, ur: NEGATIVE mg/dL
Nitrite: NEGATIVE
Protein, ur: NEGATIVE mg/dL
Specific Gravity, Urine: 1.002 — ABNORMAL LOW (ref 1.005–1.030)
pH: 6 (ref 5.0–8.0)

## 2019-03-27 MED ORDER — CEPHALEXIN 500 MG PO CAPS: 500.0000 mg | ORAL_CAPSULE | Freq: Three times a day (TID) | ORAL | 0 refills | Status: DC

## 2019-03-27 NOTE — ED Provider Notes (Signed)
[facility] EMERGENCY DEPARTMENT Provider Note   CSN: [id number] Arrival date & time: 03/26/19  2316    History   Chief Complaint Chief Complaint  Patient presents with  . Urinary Frequency    HPI Makayla Owens is a 58 y.o. female.     HPI  58 year old female with history of diabetes and recurrent UTIs comes in with chief complaint of pain with urination.  According to the patient she has been having burning with urination along with pressure like feeling in the suprapubic region.  She also has been having urinary frequency without any blood in the urine.  She reports that she has had similar symptoms in the past with UTI.  Patient denies any vaginal discharge or bleeding.  She denies any high risk sexual behavior.  Review of system is also negative for nausea, vomiting, fevers, chills, flank pain.  Past Medical History:  Diagnosis Date  . Breast cancer ([place])   . Depression   . Diabetes mellitus   . Gall bladder stones   . Migraines     Patient Active Problem List   Diagnosis Date Noted  . CKD stage 1 due to type 2 diabetes mellitus ([place]) 09/28/2015  . Need for hepatitis C screening test 06/03/2015  . Gallstone pancreatitis 09/16/2014  . Right carpal tunnel syndrome 07/30/2014  . Fibromyalgia syndrome 07/18/2012  . NECK PAIN 05/19/2010  . Calculus of gallbladder 05/05/2010  . Stopped smoking 09/10/2009  . DM (diabetes mellitus), type 2 with renal complications ([place]) 94/85/4627  . HYPERCHOLESTEROLEMIA 04/27/2009  . Depression 04/27/2009  . Breast cancer in female [facility]) 04/27/2009    Past Surgical History:  Procedure Laterality Date  . BREAST SURGERY    . MASTECTOMY     Bilateral  . MASTECTOMY Bilateral      OB History    Gravida  2   Para  1   Term  1   Preterm      AB  1   Living  1     SAB  1   TAB      Ectopic      Multiple      Live Births               Home Medications    Prior to Admission medications   Medication Sig  Start Date End Date Taking? Authorizing Provider  aspirin EC 81 MG tablet Take 1 tablet (81 mg total) by mouth daily. 10/08/18   Zenia Resides, MD  cephALEXin (KEFLEX) 500 MG capsule Take 1 capsule (500 mg total) by mouth 3 (three) times daily. 03/27/19   Varney Biles, MD  citalopram (CELEXA) 40 MG tablet Take 1 tablet (40 mg total) by mouth daily. 10/08/18   Zenia Resides, MD  losartan (COZAAR) 25 MG tablet Take 1 tablet (25 mg total) by mouth daily. Patient not taking: Reported on 03/26/2019 10/08/18   Zenia Resides, MD  meloxicam (MOBIC) 15 MG tablet Take 1 tablet (15 mg total) by mouth daily. 07/08/17   Zenia Resides, MD  metFORMIN (GLUCOPHAGE) 1000 MG tablet TAKE 1 TABLET BY MOUTH TWICE DAILY WITH MEALS 10/08/18   Hensel, Jamal Collin, MD  Multiple Vitamin (MULTIVITAMIN WITH MINERALS) TABS tablet Take 1 tablet by mouth daily.    [provider]  phenazopyridine (PYRIDIUM) 200 MG tablet Take 1 tablet (200 mg total) by mouth 3 (three) times daily. Patient not taking: Reported on 03/26/2019 09/08/17   Rolland Porter, MD  pravastatin (  PRAVACHOL) 40 MG tablet Take 1 tablet (40 mg total) by mouth daily. Patient not taking: Reported on 03/26/2019 10/09/18   Zenia Resides, MD  vitamin C (ASCORBIC ACID) 500 MG tablet Take 500 mg by mouth daily.    [provider]    Family History Family History  Problem Relation Age of Onset  . Cancer Mother   . Heart failure Mother   . Diabetes Father   . Heart failure Father   . Cancer Father   . Stroke Father     Social History Social History   Tobacco Use  . Smoking status: Former Smoker    Packs/day: 0.25    Years: 17.00    Pack years: 4.25    Types: Cigarettes    Last attempt to quit: 03/04/2015    Years since quitting: 4.0  . Smokeless tobacco: Never Used  Substance Use Topics  . Alcohol use: No    Alcohol/week: 0.0 standard drinks  . Drug use: No     Allergies   Nitrofurantoin; Elavil [amitriptyline]; Lipitor  [atorvastatin]; Morphine and related; Other; Statins; and Zofran [ondansetron hcl]   Review of Systems Review of Systems  Constitutional: Positive for activity change. Negative for chills and fever.  Gastrointestinal: Negative for nausea and vomiting.  Genitourinary: Positive for dysuria and frequency.  Allergic/Immunologic: Negative for immunocompromised state.     Physical Exam Updated Vital Signs BP 123/84 (BP Location: Right Arm)   Pulse 78   Temp 98 F (36.7 C) (Oral)   Resp 18   Ht 5\' 6"  (1.676 m)   Wt 68 kg   SpO2 95%   BMI 24.21 kg/m   Physical Exam Vitals signs and nursing note reviewed.  Constitutional:      Appearance: She is well-developed.  HENT:     Head: Normocephalic and atraumatic.  Neck:     Musculoskeletal: Normal range of motion and neck supple.  Cardiovascular:     Rate and Rhythm: Normal rate.  Pulmonary:     Effort: Pulmonary effort is normal.  Abdominal:     General: Bowel sounds are normal.  Skin:    General: Skin is warm and dry.  Neurological:     Mental Status: She is alert and oriented to person, place, and time.      ED Treatments / Results  Labs (all labs ordered are listed, but only abnormal results are displayed) Labs Reviewed  URINALYSIS, ROUTINE W REFLEX MICROSCOPIC - Abnormal; Notable for the following components:      Result Value   Specific Gravity, Urine 1.002 (*)    Hgb urine dipstick MODERATE (*)    Leukocytes,Ua SMALL (*)    Bacteria, UA RARE (*)    All other components within normal limits  CBG MONITORING, ED - Abnormal; Notable for the following components:   Glucose-Capillary 106 (*)    All other components within normal limits  URINE CULTURE    EKG None  Radiology No results found.  Procedures Procedures (including critical care time)  Medications Ordered in ED Medications  cefTRIAXone (ROCEPHIN) injection 1 g (1 g Intramuscular Given 03/26/19 2350)  sterile water (preservative free) injection  (2.1 mLs  Given 03/26/19 2350)     Initial Impression / Assessment and Plan / ED Course  I have reviewed the triage vital signs and the nursing notes.  Pertinent labs & imaging results that were available during my care of the patient were reviewed by me and considered in my medical decision making (see  chart for details).        58 year old female comes in a chief complaint of urinary frequency, burning with urination. She has known history of UTI and reports that her symptoms are consistent with her prior UTI.  On exam she has suprapubic tenderness.  Patient hemodynamically stable, nontoxic-appearing.  UA has pyuria, pretest probability for UTI is extremely high.  Clinical concerns for conditions like STDs and PID or ovarian torsion/cyst is extremely low.  We will start treating patient for a UTI. Strict ER return precautions have been discussed, and patient is agreeing with the plan and is comfortable with the workup done and the recommendations from the ER.   Final Clinical Impressions(s) / ED Diagnoses   Final diagnoses:  Acute cystitis without hematuria    ED Discharge Orders         Ordered    cephALEXin (KEFLEX) 500 MG capsule  3 times daily     03/27/19 0026           Varney Biles, MD 03/27/19 [phone]

## 2019-03-27 NOTE — Discharge Instructions (Signed)
Please take the medications prescribed for urinary tract infection.  Please return to the ER if your symptoms worsen; you have increased pain, fevers, chills, inability to keep any medications down, confusion. Otherwise see the outpatient doctor as requested.

## 2019-03-29 LAB — URINE CULTURE: Culture: >=100000 — AB

## 2019-03-30 ENCOUNTER — Other Ambulatory Visit: Payer: Self-pay

## 2019-03-30 ENCOUNTER — Encounter (HOSPITAL_COMMUNITY): Payer: Self-pay | Admitting: *Deleted

## 2019-03-30 ENCOUNTER — Emergency Department (HOSPITAL_COMMUNITY)
Admission: EM | Admit: 2019-03-30 | Discharge: 2019-03-30 | Disposition: A | Discharge status: Home / Self Care | Payer: Self-pay | Attending: Emergency Medicine | Admitting: Emergency Medicine

## 2019-03-30 ENCOUNTER — Telehealth: Payer: Self-pay | Admitting: Emergency Medicine

## 2019-03-30 ENCOUNTER — Telehealth: Payer: Self-pay | Admitting: *Deleted

## 2019-03-30 DIAGNOSIS — Z79899 Other long term (current) drug therapy: Secondary | ICD-10-CM | POA: Insufficient documentation

## 2019-03-30 DIAGNOSIS — N390 Urinary tract infection, site not specified: Secondary | ICD-10-CM | POA: Insufficient documentation

## 2019-03-30 DIAGNOSIS — Z87891 Personal history of nicotine dependence: Secondary | ICD-10-CM | POA: Insufficient documentation

## 2019-03-30 DIAGNOSIS — E119 Type 2 diabetes mellitus without complications: Secondary | ICD-10-CM | POA: Insufficient documentation

## 2019-03-30 LAB — CBC WITH DIFFERENTIAL/PLATELET
Abs Immature Granulocytes: 0.03 10*3/uL (ref 0.00–0.07)
Basophils Absolute: 0.1 10*3/uL (ref 0.0–0.1)
Basophils Relative: 1 %
Eosinophils Absolute: 0.2 10*3/uL (ref 0.0–0.5)
Eosinophils Relative: 2 %
HCT: 44 % (ref 36.0–46.0)
Hemoglobin: 14.6 g/dL (ref 12.0–15.0)
Immature Granulocytes: 0 %
Lymphocytes Relative: 37 %
Lymphs Abs: 3.4 10*3/uL (ref 0.7–4.0)
MCH: 31.3 pg (ref 26.0–34.0)
MCHC: 33.2 g/dL (ref 30.0–36.0)
MCV: 94.4 fL (ref 80.0–100.0)
Monocytes Absolute: 0.6 10*3/uL (ref 0.1–1.0)
Monocytes Relative: 6 %
Neutro Abs: 5 10*3/uL (ref 1.7–7.7)
Neutrophils Relative %: 54 %
Platelets: 250 10*3/uL (ref 150–400)
RBC: 4.66 MIL/uL (ref 3.87–5.11)
RDW: 12.8 % (ref 11.5–15.5)
WBC: 9.3 10*3/uL (ref 4.0–10.5)
nRBC: 0 % (ref 0.0–0.2)

## 2019-03-30 LAB — BASIC METABOLIC PANEL
Anion gap: 8 (ref 5–15)
BUN: 11 mg/dL (ref 6–20)
CO2: 25 mmol/L (ref 22–32)
Calcium: 8.9 mg/dL (ref 8.9–10.3)
Chloride: 98 mmol/L (ref 98–111)
Creatinine, Ser: 0.44 mg/dL (ref 0.44–1.00)
GFR calc Af Amer: >60 mL/min (ref >60)
GFR calc non Af Amer: >60 mL/min (ref >60)
Glucose, Bld: 80 mg/dL (ref 70–99)
Potassium: 3.7 mmol/L (ref 3.5–5.1)
Sodium: 131 mmol/L — ABNORMAL LOW (ref 135–145)

## 2019-03-30 LAB — URINALYSIS, ROUTINE W REFLEX MICROSCOPIC
Bilirubin Urine: NEGATIVE
Glucose, UA: NEGATIVE mg/dL
Hgb urine dipstick: NEGATIVE
Ketones, ur: NEGATIVE mg/dL
Leukocytes,Ua: NEGATIVE
Nitrite: NEGATIVE
Protein, ur: NEGATIVE mg/dL
Specific Gravity, Urine: 1.004 — ABNORMAL LOW (ref 1.005–1.030)
pH: 6 (ref 5.0–8.0)

## 2019-03-30 MED ORDER — FOSFOMYCIN TROMETHAMINE 3 G PO PACK
3.0000 g | PACK | Freq: Once | ORAL | Status: AC
  Administered 2019-03-30: 3 g via ORAL
  Filled 2019-03-30: qty 3

## 2019-03-30 NOTE — Telephone Encounter (Signed)
Post ED Visit - Positive Culture Follow-up: Successful Patient Follow-Up  Culture assessed and recommendations reviewed by:  []  Elenor Quinones, Pharm.D. []  Heide Guile, Pharm.D., BCPS AQ-ID []  Parks Neptune, Pharm.D., BCPS []  Alycia Rossetti, Pharm.D., BCPS []  [place], Pharm.D., BCPS, AAHIVP []  Legrand Como, Pharm.D., BCPS, AAHIVP [x]  Salome Arnt, PharmD, BCPS []  Johnnette Gourd, PharmD, BCPS []  Hughes Better, PharmD, BCPS []  Leeroy Cha, PharmD  Positive urine culture  []  Patient discharged without antimicrobial prescription and treatment is now indicated [x]  Organism is resistant to prescribed ED discharge antimicrobial []  Patient with positive blood cultures  Changes discussed with ED provider: Okey Regal PA New antibiotic prescription d/c cephalexin, start fosfomycin 3 grams x 1 dose Patient with continued symptoms, unable to afford, instructed to return to ED to receive treatment with fosfomycin 3 grams x 1 dose    Hazle Nordmann 03/30/2019, 12:43 PM

## 2019-03-30 NOTE — ED Provider Notes (Signed)
[facility] EMERGENCY DEPARTMENT Provider Note   CSN: [id number] Arrival date & time: 03/30/19  1314    History   Chief Complaint Chief Complaint  Patient presents with  . Medication Refill    needs medication changed    HPI Makayla Owens is a 58 y.o. female.     HPI  58 y/o female - hx of UTI's in the past, had amox for dental infection a month ago, then developed urinary symptoms - initially had an intense pressure in her pelvis - came in and found to have UTI - given rocephin and home on keflex - the culture came back as multi drug resistant and she was contacted this morning by staff to come back for evaluation / medicines.  She still has a dysuria - she has had more fatigue and had been dry heaving with some progressive back pain over the last 2 days.  No fever and no diarrhea, and no rashes.  Minimal abd pain.  - chronic back pain which is no worse.  Review of the EMR - shows that the pt was to return to get fosfomycin  3 g X 1 PO.    Past Medical History:  Diagnosis Date  . Breast cancer ([place])   . Depression   . Diabetes mellitus   . Gall bladder stones   . Migraines     Patient Active Problem List   Diagnosis Date Noted  . CKD stage 1 due to type 2 diabetes mellitus ([place]) 09/28/2015  . Need for hepatitis C screening test 06/03/2015  . Gallstone pancreatitis 09/16/2014  . Right carpal tunnel syndrome 07/30/2014  . Fibromyalgia syndrome 07/18/2012  . NECK PAIN 05/19/2010  . Calculus of gallbladder 05/05/2010  . Stopped smoking 09/10/2009  . DM (diabetes mellitus), type 2 with renal complications ([place]) 67/89/3810  . HYPERCHOLESTEROLEMIA 04/27/2009  . Depression 04/27/2009  . Breast cancer in female [facility]) 04/27/2009    Past Surgical History:  Procedure Laterality Date  . BREAST SURGERY    . MASTECTOMY     Bilateral  . MASTECTOMY Bilateral      OB History    Gravida  2   Para  1   Term  1   Preterm      AB  1   Living  1     SAB  1   TAB       Ectopic      Multiple      Live Births               Home Medications    Prior to Admission medications   Medication Sig Start Date End Date Taking? Authorizing Provider  acetaminophen (TYLENOL) 500 MG tablet Take 1,000 mg by mouth every 6 (six) hours as needed.   Yes [provider]  aspirin EC 81 MG tablet Take 1 tablet (81 mg total) by mouth daily. 10/08/18  Yes Hensel, Jamal Collin, MD  cephALEXin (KEFLEX) 500 MG capsule Take 1 capsule (500 mg total) by mouth 3 (three) times daily. 03/27/19  Yes Varney Biles, MD  citalopram (CELEXA) 40 MG tablet Take 1 tablet (40 mg total) by mouth daily. 10/08/18  Yes Hensel, Jamal Collin, MD  metFORMIN (GLUCOPHAGE) 1000 MG tablet TAKE 1 TABLET BY MOUTH TWICE DAILY WITH MEALS 10/08/18  Yes Hensel, Jamal Collin, MD  Multiple Vitamin (MULTIVITAMIN WITH MINERALS) TABS tablet Take 1 tablet by mouth daily.   Yes [provider]  phenazopyridine (PYRIDIUM) 200 MG tablet Take  200 mg by mouth 3 (three) times daily as needed for pain.   Yes [provider]  vitamin C (ASCORBIC ACID) 500 MG tablet Take 500 mg by mouth daily.   Yes [provider]    Family History Family History  Problem Relation Age of Onset  . Cancer Mother   . Heart failure Mother   . Diabetes Father   . Heart failure Father   . Cancer Father   . Stroke Father     Social History Social History   Tobacco Use  . Smoking status: Former Smoker    Packs/day: 0.25    Years: 17.00    Pack years: 4.25    Types: Cigarettes    Last attempt to quit: 03/04/2015    Years since quitting: 4.0  . Smokeless tobacco: Never Used  Substance Use Topics  . Alcohol use: No    Alcohol/week: 0.0 standard drinks  . Drug use: No     Allergies   Nitrofurantoin; Elavil [amitriptyline]; Lipitor [atorvastatin]; Morphine and related; Other; Statins; and Zofran [ondansetron hcl]   Review of Systems Review of Systems  Constitutional: Negative for fever.   Gastrointestinal: Positive for abdominal pain and nausea.  Genitourinary: Positive for dysuria.  Musculoskeletal: Positive for back pain.  Neurological: Positive for headaches.     Physical Exam Updated Vital Signs BP 126/86   Pulse 78   Temp 98.4 F (36.9 C) (Oral)   Resp 18   Ht 1.676 m (5\' 6" )   Wt 68 kg   SpO2 93%   BMI 24.21 kg/m   Physical Exam Vitals signs and nursing note reviewed.  Constitutional:      General: She is not in acute distress.    Appearance: She is well-developed.  HENT:     Head: Normocephalic and atraumatic.     Mouth/Throat:     Pharynx: No oropharyngeal exudate.  Eyes:     General: No scleral icterus.       Right eye: No discharge.        Left eye: No discharge.     Conjunctiva/sclera: Conjunctivae normal.     Pupils: Pupils are equal, round, and reactive to light.  Neck:     Musculoskeletal: Normal range of motion and neck supple.     Thyroid: No thyromegaly.     Vascular: No JVD.  Cardiovascular:     Rate and Rhythm: Normal rate and regular rhythm.     Heart sounds: Normal heart sounds. No murmur. No friction rub. No gallop.   Pulmonary:     Effort: Pulmonary effort is normal. No respiratory distress.     Breath sounds: Normal breath sounds. No wheezing or rales.  Abdominal:     General: Bowel sounds are normal. There is no distension.     Palpations: Abdomen is soft. There is no mass.     Tenderness: There is abdominal tenderness ( minimal SP, no RLQ ttp).     Comments: No CVA ttp  Musculoskeletal: Normal range of motion.        General: No tenderness.  Lymphadenopathy:     Cervical: No cervical adenopathy.  Skin:    General: Skin is warm and dry.     Findings: No erythema or rash.  Neurological:     Mental Status: She is alert.     Coordination: Coordination normal.  Psychiatric:        Behavior: Behavior normal.      ED Treatments / Results  Labs (all labs  ordered are listed, but only abnormal results are displayed)  Labs Reviewed  BASIC METABOLIC PANEL - Abnormal; Notable for the following components:      Result Value   Sodium 131 (*)    All other components within normal limits  URINALYSIS, ROUTINE W REFLEX MICROSCOPIC - Abnormal; Notable for the following components:   Color, Urine STRAW (*)    Specific Gravity, Urine 1.004 (*)    All other components within normal limits  CBC WITH DIFFERENTIAL/PLATELET    EKG None  Radiology No results found.  Procedures Procedures (including critical care time)  Medications Ordered in ED Medications  fosfomycin (MONUROL) packet 3 g (3 g Oral Given 03/30/19 1418)     Initial Impression / Assessment and Plan / ED Course  I have reviewed the triage vital signs and the nursing notes.  Pertinent labs & imaging results that were available during my care of the patient were reviewed by me and considered in my medical decision making (see chart for details).  Clinical Course as of Mar 29 1457  Mon Mar 30, 2019  1456 The pt has a normal UA - there is no LE and no nitrites and CBC is normal - BMP reassuring - fosfomycin given - will d/c home - exam unremarkable - does not appear to be pyelo.   [BM]    Clinical Course User Index [BM] Noemi Chapel, MD      Well appearing, d/w Annie Main in pharmacy - he agrees that fosfomycin will be appropriate.  She has no signs of pyelo without fever, vomiting or flank pain (has chronic Fibromyalgia which give her chronic back pain).  Final Clinical Impressions(s) / ED Diagnoses   Final diagnoses:  Urinary tract infection without hematuria, site unspecified    ED Discharge Orders    None       Noemi Chapel, MD 03/30/19 1458

## 2019-03-30 NOTE — ED Triage Notes (Signed)
Patient advised to come to ED by lab to change medication as patient stated she cannot afford it.

## 2019-03-30 NOTE — Progress Notes (Signed)
ED Antimicrobial Stewardship Positive Culture Follow Up   Makayla Owens is an 58 y.o. female who presented to [facility] on 03/26/2019 with a chief complaint of  Chief Complaint  Patient presents with  . Urinary Frequency    Recent Results (from the past 720 hour(s))  Urine culture     Status: Abnormal   Collection Time: 03/26/19 11:26 PM  Result Value Ref Range Status   Specimen Description   Final    URINE, CLEAN CATCH Performed at [facility], [address] [place], [place] [address]    Special Requests   Final    NONE Performed at [facility], [address], [place], [place] [address]    Culture (A)  Final    >=100,000 COLONIES/mL ESCHERICHIA COLI Confirmed Extended Spectrum Beta-Lactamase Producer (ESBL).  In bloodstream infections from ESBL organisms, carbapenems are preferred over piperacillin/tazobactam. They are shown to have a lower risk of mortality.    Report Status 03/29/2019 FINAL  Final   Organism ID, Bacteria ESCHERICHIA COLI (A)  Final      Susceptibility   Escherichia coli - MIC*    AMPICILLIN >=32 RESISTANT Resistant     CEFAZOLIN >=64 RESISTANT Resistant     CEFTRIAXONE >=64 RESISTANT Resistant     CIPROFLOXACIN >=4 RESISTANT Resistant     GENTAMICIN <=1 SENSITIVE Sensitive     IMIPENEM 0.5 SENSITIVE Sensitive     NITROFURANTOIN 128 RESISTANT Resistant     TRIMETH/SULFA >=320 RESISTANT Resistant     AMPICILLIN/SULBACTAM 16 INTERMEDIATE Intermediate     PIP/TAZO <=4 SENSITIVE Sensitive     Extended ESBL POSITIVE Resistant     * >=100,000 COLONIES/mL ESCHERICHIA COLI    [x]  Treated with cephalexin, organism resistant to prescribed antimicrobial []  Patient discharged originally without antimicrobial agent and treatment is now indicated  New antibiotic prescription: DC cephalexin, give fosfomycin 3gm PO x 1. If pt is unable to afford and she has continued UTI symptoms, she will need to return to the ED to receive her treatment there  ED Provider:  Lenn Sink, PA   Mayo Owczarzak, Rande Lawman 03/30/2019, 7:42 AM Clinical Pharmacist Monday - Friday phone -  [phone] Saturday - Sunday phone - [phone]

## 2019-03-30 NOTE — Discharge Instructions (Signed)
Your testing was reassuring - no signs of severe infection YOu have been given a one time dose of a medicine that will likely kill any infection that is left You should follow up with your doctor in 3 days if still having symptoms  ER for increased pain / fever / vomiting

## 2019-03-30 NOTE — Telephone Encounter (Signed)
Post ED Visit - Positive Culture Follow-up: Unsuccessful Patient Follow-up  Culture assessed and recommendations reviewed by:  []  Elenor Quinones, Pharm.D. []  Heide Guile, Pharm.D., BCPS AQ-ID []  Parks Neptune, Pharm.D., BCPS []  Alycia Rossetti, Pharm.D., BCPS []  [place], Pharm.D., BCPS, AAHIVP []  Legrand Como, Pharm.D., BCPS, AAHIVP []  Wynell Balloon, PharmD []  Vincenza Hews, PharmD, BCPS Salome Arnt, PharmD  Positive urine culture  []  Patient discharged without antimicrobial prescription and treatment is now indicated [x]  Organism is resistant to prescribed ED discharge antimicrobial []  Patient with positive blood cultures  Plan;  D/C Cephalexin.  Start Fosfomycin 3g PO x a, if unable to afford, will need to return to ED for treatment.  Unable to contact patient after 3 attempts, letter will be sent to address on file  Ardeen Fillers 03/30/2019, 12:31 PM

## 2019-11-05 ENCOUNTER — Other Ambulatory Visit: Payer: Self-pay | Admitting: Family Medicine

## 2019-11-05 DIAGNOSIS — E1122 Type 2 diabetes mellitus with diabetic chronic kidney disease: Secondary | ICD-10-CM

## 2019-11-14 ENCOUNTER — Other Ambulatory Visit: Payer: Self-pay | Admitting: Family Medicine

## 2019-11-14 DIAGNOSIS — F329 Major depressive disorder, single episode, unspecified: Secondary | ICD-10-CM

## 2019-11-14 DIAGNOSIS — F32A Depression, unspecified: Secondary | ICD-10-CM

## 2019-11-14 MED ORDER — CITALOPRAM HYDROBROMIDE 40 MG PO TABS: 40.0000 mg | ORAL_TABLET | Freq: Every day | ORAL | 3 refills | Status: DC

## 2020-06-12 ENCOUNTER — Emergency Department (HOSPITAL_COMMUNITY)
Admission: EM | Admit: 2020-06-12 | Discharge: 2020-06-12 | Disposition: A | Discharge status: Home / Self Care | Payer: Self-pay | Attending: Emergency Medicine | Admitting: Emergency Medicine

## 2020-06-12 ENCOUNTER — Encounter (HOSPITAL_COMMUNITY): Payer: Self-pay | Admitting: Emergency Medicine

## 2020-06-12 ENCOUNTER — Other Ambulatory Visit: Payer: Self-pay

## 2020-06-12 DIAGNOSIS — L0291 Cutaneous abscess, unspecified: Secondary | ICD-10-CM

## 2020-06-12 DIAGNOSIS — Z7984 Long term (current) use of oral hypoglycemic drugs: Secondary | ICD-10-CM | POA: Insufficient documentation

## 2020-06-12 DIAGNOSIS — N181 Chronic kidney disease, stage 1: Secondary | ICD-10-CM | POA: Insufficient documentation

## 2020-06-12 DIAGNOSIS — L02211 Cutaneous abscess of abdominal wall: Secondary | ICD-10-CM | POA: Insufficient documentation

## 2020-06-12 DIAGNOSIS — F17210 Nicotine dependence, cigarettes, uncomplicated: Secondary | ICD-10-CM | POA: Insufficient documentation

## 2020-06-12 DIAGNOSIS — Z7982 Long term (current) use of aspirin: Secondary | ICD-10-CM | POA: Insufficient documentation

## 2020-06-12 DIAGNOSIS — R21 Rash and other nonspecific skin eruption: Secondary | ICD-10-CM | POA: Insufficient documentation

## 2020-06-12 DIAGNOSIS — Z853 Personal history of malignant neoplasm of breast: Secondary | ICD-10-CM | POA: Insufficient documentation

## 2020-06-12 DIAGNOSIS — E1122 Type 2 diabetes mellitus with diabetic chronic kidney disease: Secondary | ICD-10-CM | POA: Insufficient documentation

## 2020-06-12 MED ORDER — TRIAMCINOLONE ACETONIDE 0.1 % EX CREA
1.0000 | TOPICAL_CREAM | Freq: Two times a day (BID) | CUTANEOUS | 0 refills | Status: DC
Start: 2020-06-12 — End: 2023-08-26

## 2020-06-12 MED ORDER — DOXYCYCLINE HYCLATE 100 MG PO TABS
100.0000 mg | ORAL_TABLET | Freq: Once | ORAL | Status: AC
  Administered 2020-06-12: 100 mg via ORAL
  Filled 2020-06-12: qty 1

## 2020-06-12 MED ORDER — DOXYCYCLINE HYCLATE 100 MG PO CAPS
100.0000 mg | ORAL_CAPSULE | Freq: Two times a day (BID) | ORAL | 0 refills | Status: DC
Start: 2020-06-12 — End: 2021-01-05

## 2020-06-12 NOTE — ED Triage Notes (Signed)
Patient c/o ? abscess to left mid back. Per patient appeared x2 years ago and was told it was a cyst by primary doctor. Per patient ruptured under skin and discolored skin to "black, yellow" until 2 weeks ago when area became red in color. Per patient area started draining yesterday a beige drainage. Denies any fevers. Per patient some sharp pain. Patient also c/o intermittent rash.

## 2020-06-12 NOTE — ED Notes (Signed)
Telfa with gauze and medipore applied. Patient tolerated well.

## 2020-06-12 NOTE — ED Provider Notes (Addendum)
[facility] Provider Note   CSN: [id number] Arrival date & time: 06/12/20  0272     History Chief Complaint  Patient presents with  . Abscess    Makayla Owens is a 59 y.o. female.  Pt presents to the ED today with a cyst to her left side.  She said it's been there for about 2 years.  She said it became red about 2 weeks ago and started draining a beige fluid yesterday.  Pt denies any f/c.  Pt also c/o a rash she gets to her back every summer.  It is very itchy.  She has tried several otc creams/benadryl without improvement in rash.  No sob.  No cp.        Past Medical History:  Diagnosis Date  . Breast cancer ([place])   . Depression   . Diabetes mellitus   . Gall bladder stones   . Migraines     Patient Active Problem List   Diagnosis Date Noted  . CKD stage 1 due to type 2 diabetes mellitus ([place]) 09/28/2015  . Need for hepatitis C screening test 06/03/2015  . Gallstone pancreatitis 09/16/2014  . Right carpal tunnel syndrome 07/30/2014  . Fibromyalgia syndrome 07/18/2012  . NECK PAIN 05/19/2010  . Calculus of gallbladder 05/05/2010  . Stopped smoking 09/10/2009  . DM (diabetes mellitus), type 2 with renal complications ([place]) 53/66/4403  . HYPERCHOLESTEROLEMIA 04/27/2009  . Depression 04/27/2009  . Breast cancer in female [facility]) 04/27/2009    Past Surgical History:  Procedure Laterality Date  . BREAST SURGERY    . MASTECTOMY     Bilateral  . MASTECTOMY Bilateral      OB History    Gravida  2   Para  1   Term  1   Preterm      AB  1   Living  1     SAB  1   TAB      Ectopic      Multiple      Live Births              Family History  Problem Relation Age of Onset  . Cancer Mother   . Heart failure Mother   . Diabetes Father   . Heart failure Father   . Cancer Father   . Stroke Father     Social History   Tobacco Use  . Smoking status: Current Every Day Smoker    Packs/day: 0.25    Years: 17.00    Pack  years: 4.25    Types: Cigarettes  . Smokeless tobacco: Never Used  Vaping Use  . Vaping Use: Never used  Substance Use Topics  . Alcohol use: No    Alcohol/week: 0.0 standard drinks  . Drug use: No    Home Medications Prior to Admission medications   Medication Sig Start Date End Date Taking? Authorizing Provider  acetaminophen (TYLENOL) 500 MG tablet Take 1,000 mg by mouth every 6 (six) hours as needed.    [provider]  aspirin EC 81 MG tablet Take 1 tablet (81 mg total) by mouth daily. 10/08/18   Zenia Resides, MD  cephALEXin (KEFLEX) 500 MG capsule Take 1 capsule (500 mg total) by mouth 3 (three) times daily. 03/27/19   Varney Biles, MD  citalopram (CELEXA) 40 MG tablet Take 1 tablet (40 mg total) by mouth daily. 11/14/19   Zenia Resides, MD  doxycycline (VIBRAMYCIN) 100 MG capsule Take 1 capsule (  100 mg total) by mouth 2 (two) times daily. 06/12/20   Isla Pence, MD  metFORMIN (GLUCOPHAGE) 1000 MG tablet TAKE 1 TABLET BY MOUTH TWICE DAILY WITH MEALS 11/05/19   Hensel, Jamal Collin, MD  Multiple Vitamin (MULTIVITAMIN WITH MINERALS) TABS tablet Take 1 tablet by mouth daily.    [provider]  phenazopyridine (PYRIDIUM) 200 MG tablet Take 200 mg by mouth 3 (three) times daily as needed for pain.    [provider]  triamcinolone cream (KENALOG) 0.1 % Apply 1 application topically 2 (two) times daily. 06/12/20   Isla Pence, MD  vitamin C (ASCORBIC ACID) 500 MG tablet Take 500 mg by mouth daily.    [provider]    Allergies    Nitrofurantoin, Elavil [amitriptyline], Lipitor [atorvastatin], Morphine and related, Other, Statins, and Zofran [ondansetron hcl]  Review of Systems   Review of Systems  Skin: Positive for rash and wound.  All other systems reviewed and are negative.   Physical Exam Updated Vital Signs BP (!) 151/73 (BP Location: Right Arm)   Pulse 78   Temp 97.9 F (36.6 C) (Oral)   Resp 18   Ht 5\' 6"  (1.676 m)    Wt 70.3 kg   SpO2 98%   BMI 25.02 kg/m   Physical Exam Vitals and nursing note reviewed.  Constitutional:      Appearance: Normal appearance.  HENT:     Head: Normocephalic and atraumatic.     Right Ear: External ear normal.     Left Ear: External ear normal.     Nose: Nose normal.     Mouth/Throat:     Mouth: Mucous membranes are moist.     Pharynx: Oropharynx is clear.  Eyes:     Extraocular Movements: Extraocular movements intact.     Conjunctiva/sclera: Conjunctivae normal.     Pupils: Pupils are equal, round, and reactive to light.  Cardiovascular:     Rate and Rhythm: Normal rate and regular rhythm.     Pulses: Normal pulses.     Heart sounds: Normal heart sounds.  Pulmonary:     Effort: Pulmonary effort is normal.     Breath sounds: Normal breath sounds.  Abdominal:     General: Abdomen is flat. Bowel sounds are normal.     Palpations: Abdomen is soft.  Musculoskeletal:     Cervical back: Normal range of motion and neck supple.  Skin:    Capillary Refill: Capillary refill takes less than 2 seconds.     Comments: Draining abscess left flank.  Urticarial rash to back.  Neurological:     General: No focal deficit present.     Mental Status: She is alert and oriented to person, place, and time.  Psychiatric:        Mood and Affect: Mood normal.        Behavior: Behavior normal.     ED Results / Procedures / Treatments   Labs (all labs ordered are listed, but only abnormal results are displayed) Labs Reviewed - No data to display  EKG None  Radiology No results found.  Procedures Procedures (including critical care time)  Medications Ordered in ED Medications  doxycycline (VIBRA-TABS) tablet 100 mg (100 mg Oral Given 06/12/20 8250)    ED Course  I have reviewed the triage vital signs and the nursing notes.  Pertinent labs & imaging results that were available during my care of the patient were reviewed by me and considered in my medical decision  making (  see chart for details).    MDM Rules/Calculators/A&P                          Pt is allergic to sulfa abx, so will be started on doxy for the abscess.  She has dm, so I did not want to put her on oral steroids.  I will try kenalog cream for the rash.  She knows to return if worse.  F/u with pcp.  After pt was d/c, she went to the pharmacy.  Pt could not afford the doxy and she said she was not allergic to sulfa drugs.  The pharmacy said she was only allergic to macrobid.  So, I took the sulfa allergy out of her list and verbally prescribed bactrim ds to the pharmacist.  Final Clinical Impression(s) / ED Diagnoses Final diagnoses:  Abscess  Rash    Rx / DC Orders ED Discharge Orders         Ordered    triamcinolone cream (KENALOG) 0.1 %  2 times daily     Discontinue  Reprint     06/12/20 0801    doxycycline (VIBRAMYCIN) 100 MG capsule  2 times daily     Discontinue  Reprint     06/12/20 0801           Isla Pence, MD 06/12/20 4967    Isla Pence, MD 06/12/20 1014

## 2020-11-17 ENCOUNTER — Other Ambulatory Visit: Payer: Self-pay

## 2020-11-17 DIAGNOSIS — F32A Depression, unspecified: Secondary | ICD-10-CM

## 2020-11-17 DIAGNOSIS — E1122 Type 2 diabetes mellitus with diabetic chronic kidney disease: Secondary | ICD-10-CM

## 2020-11-17 MED ORDER — METFORMIN HCL 1000 MG PO TABS: 1000.0000 mg | ORAL_TABLET | Freq: Two times a day (BID) | ORAL | 0 refills | Status: DC

## 2020-11-17 MED ORDER — CITALOPRAM HYDROBROMIDE 40 MG PO TABS: 40.0000 mg | ORAL_TABLET | Freq: Every day | ORAL | 0 refills | Status: DC

## 2020-12-08 ENCOUNTER — Ambulatory Visit: Payer: Medicaid Other | Admitting: Family Medicine

## 2020-12-12 ENCOUNTER — Ambulatory Visit: Payer: Medicaid Other | Admitting: Family Medicine

## 2021-01-05 ENCOUNTER — Ambulatory Visit (INDEPENDENT_AMBULATORY_CARE_PROVIDER_SITE_OTHER): Payer: Self-pay | Admitting: Family Medicine

## 2021-01-05 ENCOUNTER — Encounter: Payer: Self-pay | Admitting: Family Medicine

## 2021-01-05 ENCOUNTER — Other Ambulatory Visit: Payer: Self-pay

## 2021-01-05 VITALS — BP 120/72 | HR 88 | Ht 66.0 in | Wt 158.6 lb

## 2021-01-05 DIAGNOSIS — E1122 Type 2 diabetes mellitus with diabetic chronic kidney disease: Secondary | ICD-10-CM

## 2021-01-05 DIAGNOSIS — L723 Sebaceous cyst: Secondary | ICD-10-CM

## 2021-01-05 DIAGNOSIS — Z23 Encounter for immunization: Secondary | ICD-10-CM

## 2021-01-05 DIAGNOSIS — L089 Local infection of the skin and subcutaneous tissue, unspecified: Secondary | ICD-10-CM | POA: Insufficient documentation

## 2021-01-05 DIAGNOSIS — N181 Chronic kidney disease, stage 1: Secondary | ICD-10-CM

## 2021-01-05 LAB — POCT GLYCOSYLATED HEMOGLOBIN (HGB A1C): Hemoglobin A1C: 7.4 % — AB (ref 4.0–5.6)

## 2021-01-05 NOTE — Assessment & Plan Note (Signed)
>>  ASSESSMENT AND PLAN FOR DM (DIABETES MELLITUS), TYPE 2 WITH RENAL COMPLICATIONS ([facility]) WRITTEN ON 01/05/2021  1:51 PM BY HENSEL, Santiago Bumpers, MD  No change in meds.  Push diet and exercise.

## 2021-01-05 NOTE — Assessment & Plan Note (Signed)
No change in meds.  Push diet and exercise.

## 2021-01-05 NOTE — Assessment & Plan Note (Signed)
Seems to be healing well with topical care.  Gave warning signs.  Tetanus booster.

## 2021-01-05 NOTE — Progress Notes (Signed)
    SUBJECTIVE:   CHIEF COMPLAINT / HPI:   FU cyst/boil on back.  Patient had longstanding, non tender lump on her back.  3 weeks ago, it became infected.  It drained on own, she went to urgent care and was given bactrim.  Improved, then worsened again.  It drained on its own and is now improving.  No fever or systemic symptoms.  DM.  On metformin 1,000 bid.  A1C today is up.  She admits to dietary indiscretion and wt gain.    Social, no insurance.  Was qualified for the [facility] card in [place]  Behind on several health maint, because she cannot afford.  Fully COVID vaccinated.  She is due for a tetanus booster, especially with draining cyst.      OBJECTIVE:   BP 120/72   Pulse 88   Ht 5\' 6"  (1.676 m)   Wt 158 lb 9.6 oz (71.9 kg)   SpO2 94%   BMI 25.60 kg/m   Has small, red, draining cyst on the right infrascapular region.    ASSESSMENT/PLAN:   No problem-specific Assessment & Plan notes found for this encounter.     Zenia Resides, MD [place]

## 2021-01-05 NOTE — Patient Instructions (Signed)
Your cyst looks good and should heal on its own. Tetanus shot today because of cyst opening. See me in 3 months to see if you can get your diabetes under better control with diet and exercise. Apply for the [facility] card again.

## 2021-03-12 ENCOUNTER — Other Ambulatory Visit: Payer: Self-pay

## 2021-03-12 ENCOUNTER — Emergency Department (HOSPITAL_COMMUNITY)
Admission: EM | Admit: 2021-03-12 | Discharge: 2021-03-12 | Discharge status: Against Medical Advice | Payer: Medicaid Other

## 2021-03-12 NOTE — ED Triage Notes (Signed)
Delay in bringing pt to triage due to emergency situation in main ED. Went to get pt for triage and registration informed RN that pt had already left.

## 2021-03-22 ENCOUNTER — Other Ambulatory Visit: Payer: Self-pay

## 2021-03-22 DIAGNOSIS — F32A Depression, unspecified: Secondary | ICD-10-CM

## 2021-03-22 DIAGNOSIS — N181 Chronic kidney disease, stage 1: Secondary | ICD-10-CM

## 2021-03-22 MED ORDER — CITALOPRAM HYDROBROMIDE 40 MG PO TABS: 40.0000 mg | ORAL_TABLET | Freq: Every day | ORAL | 3 refills | Status: DC

## 2021-03-22 MED ORDER — METFORMIN HCL 1000 MG PO TABS: 1000.0000 mg | ORAL_TABLET | Freq: Two times a day (BID) | ORAL | 3 refills | Status: DC

## 2021-09-23 ENCOUNTER — Encounter (HOSPITAL_COMMUNITY): Payer: Self-pay

## 2021-09-23 ENCOUNTER — Other Ambulatory Visit: Payer: Self-pay

## 2021-09-23 ENCOUNTER — Emergency Department (HOSPITAL_COMMUNITY)
Admission: EM | Admit: 2021-09-23 | Discharge: 2021-09-23 | Disposition: A | Discharge status: Home / Self Care | Payer: Medicaid Other | Attending: Emergency Medicine | Admitting: Emergency Medicine

## 2021-09-23 DIAGNOSIS — L259 Unspecified contact dermatitis, unspecified cause: Secondary | ICD-10-CM

## 2021-09-23 DIAGNOSIS — E1122 Type 2 diabetes mellitus with diabetic chronic kidney disease: Secondary | ICD-10-CM | POA: Insufficient documentation

## 2021-09-23 DIAGNOSIS — Z853 Personal history of malignant neoplasm of breast: Secondary | ICD-10-CM | POA: Insufficient documentation

## 2021-09-23 DIAGNOSIS — L299 Pruritus, unspecified: Secondary | ICD-10-CM | POA: Insufficient documentation

## 2021-09-23 DIAGNOSIS — F17210 Nicotine dependence, cigarettes, uncomplicated: Secondary | ICD-10-CM | POA: Insufficient documentation

## 2021-09-23 DIAGNOSIS — N181 Chronic kidney disease, stage 1: Secondary | ICD-10-CM | POA: Insufficient documentation

## 2021-09-23 DIAGNOSIS — Z7984 Long term (current) use of oral hypoglycemic drugs: Secondary | ICD-10-CM | POA: Insufficient documentation

## 2021-09-23 DIAGNOSIS — R21 Rash and other nonspecific skin eruption: Secondary | ICD-10-CM

## 2021-09-23 DIAGNOSIS — Z7982 Long term (current) use of aspirin: Secondary | ICD-10-CM | POA: Insufficient documentation

## 2021-09-23 MED ORDER — DEXAMETHASONE SODIUM PHOSPHATE 10 MG/ML IJ SOLN
10.0000 mg | Freq: Once | INTRAMUSCULAR | Status: AC
  Administered 2021-09-23: 10 mg via INTRAMUSCULAR
  Filled 2021-09-23: qty 1

## 2021-09-23 MED ORDER — PREDNISONE 20 MG PO TABS
ORAL_TABLET | ORAL | 0 refills | Status: DC
Start: 2021-09-23 — End: 2022-07-09

## 2021-09-23 NOTE — Discharge Instructions (Addendum)
It was our pleasure to provide your ER care today - we hope that you feel better.  Keep skin clean/dry. Avoid any perfumed soaps, detergents or body products - try gentle moisturizer such as eucerin, aveeno, etc.  Take prednisone as prescribed.   Follow up with primary care doctor/dermatologist in 1-2 weeks if symptoms fail to improve/resolve.  Return to ER if worse, high fevers, trouble breathing, or other concern.

## 2021-09-23 NOTE — ED Provider Notes (Signed)
[facility] EMERGENCY DEPARTMENT Provider Note   CSN: [id number] Arrival date & time: 09/23/21  7322     History Chief Complaint  Patient presents with   Rash    Makayla Owens is a 60 y.o. female.  Patient c/o rash for past 3-4 months. States started after working in garden in July, had linear, itchy, erythematous lesions to left forearm - lesions there resolved, but states since then has similar new, erythematous, pruritic lesions popping up in different areas including upper and lower back, bilateral legs. No mucous membrane lesions. No palms or soles. Has tried to avoid perfumed soaps, lotions or other home or personal products. No change in meds or new meds during this period.  No fevers. Normal appetite. No wt change. States other than rash does not feel sick or ill.   The history is provided by the patient and medical records.  Rash Associated symptoms: no abdominal pain, no diarrhea, no fever, no headaches, no shortness of breath, no sore throat and not vomiting       Past Medical History:  Diagnosis Date   Breast cancer ([place])    Depression    Diabetes mellitus    Gall bladder stones    Migraines     Patient Active Problem List   Diagnosis Date Noted   Infected sebaceous cyst 01/05/2021   CKD stage 1 due to type 2 diabetes mellitus ([place]) 09/28/2015   Gallstone pancreatitis 09/16/2014   Right carpal tunnel syndrome 07/30/2014   Fibromyalgia syndrome 07/18/2012   NECK PAIN 05/19/2010   Calculus of gallbladder 05/05/2010   Stopped smoking 09/10/2009   DM (diabetes mellitus), type 2 with renal complications ([place]) 02/54/2706   HYPERCHOLESTEROLEMIA 04/27/2009   Depression 04/27/2009   Breast cancer in female [facility]) 04/27/2009    Past Surgical History:  Procedure Laterality Date   BREAST SURGERY     MASTECTOMY     Bilateral   MASTECTOMY Bilateral      OB History     Gravida  2   Para  1   Term  1   Preterm      AB  1   Living  1      SAB  1    IAB      Ectopic      Multiple      Live Births              Family History  Problem Relation Age of Onset   Cancer Mother    Heart failure Mother    Diabetes Father    Heart failure Father    Cancer Father    Stroke Father     Social History   Tobacco Use   Smoking status: Every Day    Packs/day: 0.25    Years: 17.00    Pack years: 4.25    Types: Cigarettes   Smokeless tobacco: Never  Vaping Use   Vaping Use: Never used  Substance Use Topics   Alcohol use: No    Alcohol/week: 0.0 standard drinks   Drug use: No    Home Medications Prior to Admission medications   Medication Sig Start Date End Date Taking? Authorizing Provider  acetaminophen (TYLENOL) 500 MG tablet Take 1,000 mg by mouth every 6 (six) hours as needed.    [provider]  aspirin EC 81 MG tablet Take 1 tablet (81 mg total) by mouth daily. 10/08/18   Zenia Resides, MD  citalopram (CELEXA) 40 MG tablet Take  1 tablet (40 mg total) by mouth daily. 03/22/21   Zenia Resides, MD  metFORMIN (GLUCOPHAGE) 1000 MG tablet Take 1 tablet (1,000 mg total) by mouth 2 (two) times daily with a meal. 03/22/21   Hensel, Jamal Collin, MD  Multiple Vitamin (MULTIVITAMIN WITH MINERALS) TABS tablet Take 1 tablet by mouth daily.    [provider]  triamcinolone cream (KENALOG) 0.1 % Apply 1 application topically 2 (two) times daily. 06/12/20   Isla Pence, MD  vitamin C (ASCORBIC ACID) 500 MG tablet Take 500 mg by mouth daily.    [provider]    Allergies    Nitrofurantoin, Elavil [amitriptyline], Lipitor [atorvastatin], Morphine and related, Other, Statins, and Zofran [ondansetron hcl]  Review of Systems   Review of Systems  Constitutional:  Negative for chills and fever.  HENT:  Negative for sore throat.   Eyes:  Negative for redness.  Respiratory:  Negative for cough and shortness of breath.   Cardiovascular:  Negative for chest pain.  Gastrointestinal:  Negative for  abdominal pain, diarrhea and vomiting.  Genitourinary:  Negative for dysuria.  Musculoskeletal:  Negative for back pain and neck pain.  Skin:  Positive for rash.  Neurological:  Negative for headaches.  Hematological:  Does not bruise/bleed easily.  Psychiatric/Behavioral:  Negative for confusion.    Physical Exam Updated Vital Signs BP 137/87 (BP Location: Right Arm)   Pulse 89   Temp 98.5 F (36.9 C) (Oral)   Resp 18   Ht 1.676 m (5\' 6" )   Wt 72.6 kg   SpO2 96%   BMI 25.82 kg/m   Physical Exam Vitals and nursing note reviewed.  Constitutional:      Appearance: Normal appearance. She is well-developed.  HENT:     Head: Atraumatic.     Nose: Nose normal.     Mouth/Throat:     Mouth: Mucous membranes are moist.     Comments: No mm lesions.  Eyes:     General: No scleral icterus.    Conjunctiva/sclera: Conjunctivae normal.  Neck:     Trachea: No tracheal deviation.  Cardiovascular:     Rate and Rhythm: Normal rate and regular rhythm.     Pulses: Normal pulses.     Heart sounds: Normal heart sounds. No murmur heard.   No friction rub. No gallop.  Pulmonary:     Effort: Pulmonary effort is normal. No respiratory distress.     Breath sounds: Normal breath sounds. No wheezing.  Abdominal:     General: Bowel sounds are normal. There is no distension.     Palpations: Abdomen is soft.     Tenderness: There is no abdominal tenderness.  Genitourinary:    Comments: No cva tenderness.  Musculoskeletal:        General: No swelling.     Cervical back: Normal range of motion and neck supple. No rigidity. No muscular tenderness.  Skin:    General: Skin is warm and dry.     Findings: No rash.     Comments: Spare, erythematous, circular lesions, 2-5 mm diameter, occasionally in patches, to bilateral legs and back. No mm lesions. No palms or soles.   Neurological:     Mental Status: She is alert.     Comments: Alert, speech normal.   Psychiatric:        Mood and Affect: Mood  normal.    ED Results / Procedures / Treatments   Labs (all labs ordered are listed, but only abnormal results  are displayed) Labs Reviewed - No data to display  EKG None  Radiology No results found.  Procedures Procedures   Medications Ordered in ED Medications  dexamethasone (DECADRON) injection 10 mg (has no administration in time range)    ED Course  I have reviewed the triage vital signs and the nursing notes.  Pertinent labs & imaging results that were available during my care of the patient were reviewed by me and considered in my medical decision making (see chart for details).    MDM Rules/Calculators/A&P                          Pt requests steroid shot/meds - states blood sugars are well controlled and she will monitor closely.   Decadron im. Rx for home.   Reviewed nursing notes and prior charts for additional history.   Pt currently appears stable for d/c.  Rec pcp/derm f/u.    Final Clinical Impression(s) / ED Diagnoses Final diagnoses:  None    Rx / DC Orders ED Discharge Orders     None        Lajean Saver, MD 09/23/21 [phone]

## 2021-09-23 NOTE — ED Triage Notes (Signed)
Pt arrives with complaints of a rash that started in July that has spread to back and legs. Pt states they are "itchy and blister up".

## 2022-03-26 ENCOUNTER — Other Ambulatory Visit: Payer: Self-pay | Admitting: Family Medicine

## 2022-03-26 DIAGNOSIS — F32A Depression, unspecified: Secondary | ICD-10-CM

## 2022-03-26 DIAGNOSIS — E1122 Type 2 diabetes mellitus with diabetic chronic kidney disease: Secondary | ICD-10-CM

## 2022-03-26 MED ORDER — METFORMIN HCL 1000 MG PO TABS: 1000.0000 mg | ORAL_TABLET | Freq: Two times a day (BID) | ORAL | 3 refills | Status: DC

## 2022-03-26 MED ORDER — CITALOPRAM HYDROBROMIDE 40 MG PO TABS: 40.0000 mg | ORAL_TABLET | Freq: Every day | ORAL | 3 refills | Status: DC

## 2022-07-09 ENCOUNTER — Encounter: Payer: Self-pay | Admitting: Family Medicine

## 2022-07-09 ENCOUNTER — Ambulatory Visit (INDEPENDENT_AMBULATORY_CARE_PROVIDER_SITE_OTHER): Payer: Self-pay | Admitting: Family Medicine

## 2022-07-09 VITALS — BP 119/81 | HR 85 | Ht 66.0 in | Wt 143.8 lb

## 2022-07-09 DIAGNOSIS — M5416 Radiculopathy, lumbar region: Secondary | ICD-10-CM

## 2022-07-09 DIAGNOSIS — N181 Chronic kidney disease, stage 1: Secondary | ICD-10-CM

## 2022-07-09 DIAGNOSIS — E1122 Type 2 diabetes mellitus with diabetic chronic kidney disease: Secondary | ICD-10-CM

## 2022-07-09 DIAGNOSIS — E1140 Type 2 diabetes mellitus with diabetic neuropathy, unspecified: Secondary | ICD-10-CM

## 2022-07-09 DIAGNOSIS — E7800 Pure hypercholesterolemia, unspecified: Secondary | ICD-10-CM

## 2022-07-09 LAB — POCT GLYCOSYLATED HEMOGLOBIN (HGB A1C): HbA1c, POC (controlled diabetic range): 6.6 % (ref 0.0–7.0)

## 2022-07-09 MED ORDER — PRAVASTATIN SODIUM 40 MG PO TABS: 40.0000 mg | ORAL_TABLET | Freq: Every day | ORAL | 3 refills | Status: DC

## 2022-07-09 MED ORDER — GABAPENTIN 300 MG PO CAPS: 300.0000 mg | ORAL_CAPSULE | Freq: Three times a day (TID) | ORAL | 5 refills | Status: DC

## 2022-07-09 NOTE — Patient Instructions (Addendum)
You will need a cholesterol test in three months.   For sure you need a flu shot this fall We can talk about a Covid booster. See me in three months for a PAP smear - likely your last ever PAP I sent in a refill of your cholesterol medicine.  It is very important that you stay on it.   The circulation test to your legs was good.  The cramping is likely from the back.  I sent in a medium dose of the nerve pain medicine.  You can play around with dosing to find what works best for you.

## 2022-07-09 NOTE — Assessment & Plan Note (Signed)
Restart statin.  Check lipid panel in three months.

## 2022-07-09 NOTE — Progress Notes (Signed)
    SUBJECTIVE:   CHIEF COMPLAINT / HPI:   Main issue is worsening low back pain and right leg (thigh) pain.  Worse with exercise.    DM.  Has lost weight.  Taking metformin.  A1C=6.6 today.  Not interested in sGLT2 mainly due to cost.  Has not been takjing pravastatin.  Agrees to restart.  No insurance.  Behind on several health maint issues.  She agreed to Pap next visit.  Reluctant to get flu or shingles vaccine.      OBJECTIVE:   BP 119/81   Pulse 85   Ht '5\' 6"'$  (1.676 m)   Wt 143 lb 12.8 oz (65.2 kg)   SpO2 100%   BMI 23.21 kg/m   VS noted Lungs clear Cardiac RRR without m or g Abd benign ABIs of lower ext are normal.  ASSESSMENT/PLAN:   Type 2 diabetes mellitus with diabetic neuropathy, unspecified ([place]) Doing well.  A1C at goal.  Restart statin.  Lumbar back pain with radiculopathy affecting right lower extremity Leg pain likely is radiculopathy, not vascular claudication.  She agrees to gabapentin.  We will start low and titrate dose.    HYPERCHOLESTEROLEMIA Restart statin.  Check lipid panel in three months.     Zenia Resides, MD [place]

## 2022-07-09 NOTE — Assessment & Plan Note (Signed)
Doing well.  A1C at goal.  Restart statin.

## 2022-07-09 NOTE — Addendum Note (Signed)
Addended by: Katharina Caper, Daisi Kentner D on: 07/09/2022 05:18 PM   Modules accepted: Orders

## 2022-07-09 NOTE — Assessment & Plan Note (Signed)
Leg pain likely is radiculopathy, not vascular claudication.  She agrees to gabapentin.  We will start low and titrate dose.

## 2022-07-10 LAB — BASIC METABOLIC PANEL
BUN/Creatinine Ratio: 12 (ref 9–23)
BUN: 7 mg/dL (ref 6–24)
CO2: 23 mmol/L (ref 20–29)
Calcium: 9.5 mg/dL (ref 8.7–10.2)
Chloride: 96 mmol/L (ref 96–106)
Creatinine, Ser: 0.59 mg/dL (ref 0.57–1.00)
Glucose: 88 mg/dL (ref 70–99)
Potassium: 5 mmol/L (ref 3.5–5.2)
Sodium: 134 mmol/L (ref 134–144)
eGFR: 104 mL/min/{1.73_m2} (ref >59)

## 2022-10-22 ENCOUNTER — Ambulatory Visit: Payer: Medicaid Other | Admitting: Family Medicine

## 2022-12-10 ENCOUNTER — Ambulatory Visit: Payer: Medicaid Other | Admitting: Family Medicine

## 2023-01-08 ENCOUNTER — Encounter: Payer: Self-pay | Admitting: Family Medicine

## 2023-01-14 ENCOUNTER — Encounter: Payer: Self-pay | Admitting: Family Medicine

## 2023-01-14 ENCOUNTER — Ambulatory Visit (INDEPENDENT_AMBULATORY_CARE_PROVIDER_SITE_OTHER): Payer: Medicaid Other | Admitting: Family Medicine

## 2023-01-14 VITALS — BP 116/82 | HR 95 | Ht 66.0 in | Wt 141.6 lb

## 2023-01-14 DIAGNOSIS — Z1211 Encounter for screening for malignant neoplasm of colon: Secondary | ICD-10-CM | POA: Diagnosis not present

## 2023-01-14 DIAGNOSIS — E7800 Pure hypercholesterolemia, unspecified: Secondary | ICD-10-CM | POA: Diagnosis not present

## 2023-01-14 DIAGNOSIS — Z23 Encounter for immunization: Secondary | ICD-10-CM | POA: Diagnosis not present

## 2023-01-14 DIAGNOSIS — E1140 Type 2 diabetes mellitus with diabetic neuropathy, unspecified: Secondary | ICD-10-CM

## 2023-01-14 DIAGNOSIS — E1122 Type 2 diabetes mellitus with diabetic chronic kidney disease: Secondary | ICD-10-CM | POA: Diagnosis not present

## 2023-01-14 DIAGNOSIS — K8021 Calculus of gallbladder without cholecystitis with obstruction: Secondary | ICD-10-CM | POA: Diagnosis not present

## 2023-01-14 DIAGNOSIS — N181 Chronic kidney disease, stage 1: Secondary | ICD-10-CM | POA: Diagnosis not present

## 2023-01-14 LAB — POCT GLYCOSYLATED HEMOGLOBIN (HGB A1C): HbA1c, POC (controlled diabetic range): 7 % (ref 0.0–7.0)

## 2023-01-14 MED ORDER — EMPAGLIFLOZIN 10 MG PO TABS: 10.0000 mg | ORAL_TABLET | Freq: Every day | ORAL | 3 refills | Status: DC

## 2023-01-14 NOTE — Patient Instructions (Addendum)
I recommend Dr. Thersa Salt I put in gi (colonoscopy) and general surgery (remove gall bladder) referrals.  Someone should call. Please get your own eye doctor.  Have them send me or Dr. Lacinda Axon the report for our records.  I will call with lab test results. We will try a low dose of the new drug.  Hopefully no UTI. I will miss you

## 2023-01-15 ENCOUNTER — Encounter: Payer: Self-pay | Admitting: *Deleted

## 2023-01-15 ENCOUNTER — Encounter: Payer: Self-pay | Admitting: Family Medicine

## 2023-01-15 MED ORDER — ROSUVASTATIN CALCIUM 20 MG PO TABS: 20.0000 mg | ORAL_TABLET | Freq: Every day | ORAL | 3 refills | Status: DC

## 2023-01-15 NOTE — Assessment & Plan Note (Signed)
Add low dose Jardiance for renal/cardiac protection.  Watch for increase in UTI.  Urine microalbumin.  She will schedule eye exam.

## 2023-01-15 NOTE — Progress Notes (Signed)
    SUBJECTIVE:   CHIEF COMPLAINT / HPI:   Patient finally got health insurance - Medicaid.  She has a backlog of issues: DM.  Fair control.  Weight in ideal range.  On max dose of metformin.  She will benefit from SGLT2 and is worried about increase UTI side effects.  Due for microalbumin.  Also due for eye exam. Due for lipid panel.  Only on low dose statin due to previous intolerance of high dose. Has symptomatic cholelithiasis.  Now with insurance, would like a gen surg referral Never had colonoscopy for colon cancer screen.  Willing.   With me retiring, wants to establish with doctor in [place].  Recommended Dr. Thersa Salt.   OBJECTIVE:   BP 116/82   Pulse 95   Ht '5\' 6"'$  (1.676 m)   Wt 141 lb 9.6 oz (64.2 kg)   SpO2 97%   BMI 22.85 kg/m   Lungs clear Cardiac RRR without m or g  ASSESSMENT/PLAN:   HYPERCHOLESTEROLEMIA Lipid panel shows very high LDL.  Called patient- she tells me she has been taking pravastatin.  Hx of intolerance to atorvastatin and simvastatin.  Will try switch to rosuvastatin.  She is aware of the possibility of muscle aches.  Type 2 diabetes mellitus with diabetic neuropathy, unspecified ([place]) Add low dose Jardiance for renal/cardiac protection.  Watch for increase in UTI.  Urine microalbumin.  She will schedule eye exam.  Colon cancer screening GI referral for colonoscopy.  Screening.  Calculus of gallbladder Gen surg referral for symptomatic cholelithiasis.    Zenia Resides, MD [place]

## 2023-01-15 NOTE — Assessment & Plan Note (Signed)
Gen surg referral for symptomatic cholelithiasis.

## 2023-01-15 NOTE — Assessment & Plan Note (Signed)
GI referral for colonoscopy.  Screening.

## 2023-01-15 NOTE — Assessment & Plan Note (Signed)
Lipid panel shows very high LDL.  Called patient- she tells me she has been taking pravastatin.  Hx of intolerance to atorvastatin and simvastatin.  Will try switch to rosuvastatin.  She is aware of the possibility of muscle aches.

## 2023-01-16 LAB — MICROALBUMIN / CREATININE URINE RATIO
Creatinine, Urine: 10.1 mg/dL
Microalb/Creat Ratio: <30 mg/g creat (ref 0–29)
Microalbumin, Urine: <3 ug/mL

## 2023-01-16 LAB — CBC
Hematocrit: 43.2 % (ref 34.0–46.6)
Hemoglobin: 14.9 g/dL (ref 11.1–15.9)
MCH: 30.6 pg (ref 26.6–33.0)
MCHC: 34.5 g/dL (ref 31.5–35.7)
MCV: 89 fL (ref 79–97)
Platelets: 314 10*3/uL (ref 150–450)
RBC: 4.87 x10E6/uL (ref 3.77–5.28)
RDW: 12.6 % (ref 11.7–15.4)
WBC: 8.4 10*3/uL (ref 3.4–10.8)

## 2023-01-16 LAB — CMP14+EGFR
ALT: 13 IU/L (ref 0–32)
AST: 14 IU/L (ref 0–40)
Albumin/Globulin Ratio: 2.2 (ref 1.2–2.2)
Albumin: 4.4 g/dL (ref 3.8–4.9)
Alkaline Phosphatase: 100 IU/L (ref 44–121)
BUN/Creatinine Ratio: 12 (ref 9–23)
BUN: 7 mg/dL (ref 6–24)
Bilirubin Total: 0.2 mg/dL (ref 0.0–1.2)
CO2: 22 mmol/L (ref 20–29)
Calcium: 9.4 mg/dL (ref 8.7–10.2)
Chloride: 95 mmol/L — ABNORMAL LOW (ref 96–106)
Creatinine, Ser: 0.57 mg/dL (ref 0.57–1.00)
Globulin, Total: 2 g/dL (ref 1.5–4.5)
Glucose: 102 mg/dL — ABNORMAL HIGH (ref 70–99)
Potassium: 4.5 mmol/L (ref 3.5–5.2)
Sodium: 135 mmol/L (ref 134–144)
Total Protein: 6.4 g/dL (ref 6.0–8.5)
eGFR: 105 mL/min/{1.73_m2} (ref >59)

## 2023-01-16 LAB — LIPID PANEL
Chol/HDL Ratio: 4.4 ratio (ref 0.0–4.4)
Cholesterol, Total: 265 mg/dL — ABNORMAL HIGH (ref 100–199)
HDL: 60 mg/dL (ref >39)
LDL Chol Calc (NIH): 183 mg/dL — ABNORMAL HIGH (ref 0–99)
Triglycerides: 123 mg/dL (ref 0–149)
VLDL Cholesterol Cal: 22 mg/dL (ref 5–40)

## 2023-01-21 ENCOUNTER — Other Ambulatory Visit (HOSPITAL_COMMUNITY): Payer: Self-pay

## 2023-01-21 ENCOUNTER — Telehealth: Payer: Self-pay

## 2023-01-21 NOTE — Telephone Encounter (Signed)
A Prior Authorization was initiated for this patients JARDIANCE through CoverMyMeds.   Key: Aris Lot

## 2023-01-22 NOTE — Telephone Encounter (Signed)
Prior Auth for patients medication JARDIANCE approved by [employer] from 01/21/23 to 01/22/24.  CoverMyMeds KeyAris Lot PA Case ID #: [id number]

## 2023-02-05 ENCOUNTER — Ambulatory Visit (INDEPENDENT_AMBULATORY_CARE_PROVIDER_SITE_OTHER): Payer: Medicaid Other | Admitting: Surgery

## 2023-02-05 ENCOUNTER — Encounter: Payer: Self-pay | Admitting: Surgery

## 2023-02-05 VITALS — BP 101/70 | HR 93 | Temp 98.0°F | Resp 16 | Ht 66.0 in | Wt 142.0 lb

## 2023-02-05 DIAGNOSIS — K8020 Calculus of gallbladder without cholecystitis without obstruction: Secondary | ICD-10-CM | POA: Diagnosis not present

## 2023-02-05 NOTE — Progress Notes (Unsigned)
[facility] History and Physical  Reason for Referral: Cholelithiasis Referring Physician: Dr. Andria Frames  Chief Complaint   New Patient (Initial Visit)     Makayla Owens is a 62 y.o. female.  HPI: Patient presents for evaluation of gallstones.  She was first diagnosed with gallstones in 2008.  She currently complains of epigastric and right upper quadrant abdominal pain that is constant in nature.  She has occasional associated nausea and vomiting.  She states that she has a history of gallstone pancreatitis but had never followed up with a general surgeon regarding excision of her gallbladder.  She did try low-fat diet for a little while, and this did help her symptoms, though she thought she was losing too much weight.  Her past medical history significant for diabetes, chronic kidney disease, hyperlipidemia, left breast cancer, and GERD.  Her surgical history is significant for bilateral mastectomy with reconstruction 2008 for her left breast cancer, and D&C.  She smokes 1/2 pack of cigarettes per day.  She denies use of alcohol and illicit drugs.  She denies use of blood thinning medications.  She is currently on Jardiance.  Past Medical History:  Diagnosis Date   Breast cancer ([place])    Depression    Diabetes mellitus    Gall bladder stones    Migraines     Past Surgical History:  Procedure Laterality Date   BREAST SURGERY     MASTECTOMY     Bilateral   MASTECTOMY Bilateral     Family History  Problem Relation Age of Onset   Cancer Mother    Heart failure Mother    Diabetes Father    Heart failure Father    Cancer Father    Stroke Father     Social History   Tobacco Use   Smoking status: Every Day    Packs/day: 0.25    Years: 17.00    Total pack years: 4.25    Types: Cigarettes   Smokeless tobacco: Never  Vaping Use   Vaping Use: Never used  Substance Use Topics   Alcohol use: No    Alcohol/week: 0.0 standard drinks of alcohol   Drug use: No     Medications: I have reviewed the patient's current medications. Allergies as of 02/05/2023       Reactions   Nitrofurantoin Itching, Rash   Elavil [amitriptyline]    Felt bad and seemed to cause UTIs   Lipitor [atorvastatin]    Muscle pain and nausea   Morphine And Related Other (See Comments)   Does not like side effects   Other    Pt does not want narcotics   Statins    Body aches   Zofran [ondansetron Hcl] Nausea And Vomiting        Medication List        Accurate as of February 05, 2023  9:04 AM. If you have any questions, ask your nurse or doctor.          acetaminophen 500 MG tablet Commonly known as: TYLENOL Take 1,000 mg by mouth every 6 (six) hours as needed.   ascorbic acid 500 MG tablet Commonly known as: VITAMIN C Take 500 mg by mouth daily.   citalopram 40 MG tablet Commonly known as: CELEXA Take 1 tablet (40 mg total) by mouth daily.   empagliflozin 10 MG Tabs tablet Commonly known as: JARDIANCE Take 1 tablet (10 mg total) by mouth daily.   gabapentin 300 MG capsule Commonly known as: NEURONTIN Take  1 capsule (300 mg total) by mouth 3 (three) times daily.   metFORMIN 1000 MG tablet Commonly known as: GLUCOPHAGE Take 1 tablet (1,000 mg total) by mouth 2 (two) times daily with a meal.   multivitamin with minerals Tabs tablet Take 1 tablet by mouth daily.   rosuvastatin 20 MG tablet Commonly known as: Crestor Take 1 tablet (20 mg total) by mouth daily.   triamcinolone cream 0.1 % Commonly known as: KENALOG Apply 1 application topically 2 (two) times daily.         ROS:  Constitutional: negative for chills, fatigue, and fevers Eyes: negative for visual disturbance and pain Ears, nose, mouth, throat, and face: negative for ear drainage, sore throat, and sinus problems Respiratory: negative for cough, wheezing, and shortness of breath Cardiovascular: negative for chest pain and palpitations Gastrointestinal: positive for abdominal  pain and nausea, negative for reflux symptoms and vomiting Genitourinary:positive for frequency, negative for dysuria Integument/breast: negative for dryness and rash Hematologic/lymphatic: negative for bleeding and lymphadenopathy Musculoskeletal:positive for joint pain, negative for back pain and neck pain Neurological: negative for dizziness and tremors Endocrine: negative for temperature intolerance  Blood pressure 101/70, pulse 93, temperature 98 F (36.7 C), temperature source Oral, resp. rate 16, height '5\' 6"'$  (1.676 m), weight 142 lb (64.4 kg), SpO2 93 %. Physical Exam Vitals reviewed.  Constitutional:      Appearance: Normal appearance.  HENT:     Head: Normocephalic and atraumatic.  Eyes:     Extraocular Movements: Extraocular movements intact.     Pupils: Pupils are equal, round, and reactive to light.  Cardiovascular:     Rate and Rhythm: Normal rate and regular rhythm.  Pulmonary:     Effort: Pulmonary effort is normal.     Breath sounds: Normal breath sounds.  Abdominal:     Comments: Abdomen soft, nondistended, no percussion tenderness, mild TTP in epigastrium and RUQ; no rigidity, guarding or rebound tenderness; negative Murphy's  Musculoskeletal:        General: Normal range of motion.     Cervical back: Normal range of motion.  Skin:    General: Skin is warm and dry.  Neurological:     General: No focal deficit present.     Mental Status: She is alert and oriented to person, place, and time.  Psychiatric:        Mood and Affect: Mood normal.        Behavior: Behavior normal.     Results: Abdominal Ultrasound (11/25/2015): IMPRESSION: Cholelithiasis. No gallbladder wall thickening. A small cyst seen in the right lobe of the liver on CT is not appreciable by ultrasound. No focal liver lesions are seen by ultrasound.  Assessment & Plan:  Makayla Owens is a 62 y.o. female who presents for evaluation of gallstones  -Explained the pathophysiology of  gallbladder disease, and why recommend surgical excision -I counseled the patient about the indication, risks and benefits of robotic assisted laparoscopic cholecystectomy.  She understands there is a very small chance for bleeding, infection, injury to normal structures (including common bile duct), conversion to open surgery, persistent symptoms, evolution of postcholecystectomy diarrhea, need for secondary interventions, anesthesia reaction, cardiopulmonary issues and other risks not specifically detailed here. I described the expected recovery, the plan for follow-up and the restrictions during the recovery phase.  All questions were answered. -Patient tentatively scheduled for surgery on 3/25 -Information provided regarding cholelithiasis, cholecystectomy, and low-fat diet -Advised her to present to the emergency department if she begins to  have severe epigastric/right upper quadrant abdominal pain, nausea, vomiting, fever, and chills  All questions were answered to the satisfaction of the patient.  Graciella Freer, DO [facility] [address] [place], [place] [address] [phone] (office)

## 2023-02-07 NOTE — H&P (Signed)
[facility] History and Physical   Reason for Referral: Cholelithiasis Referring Physician: Dr. Andria Frames   Chief Complaint   New Patient (Initial Visit)        Makayla Owens is a 62 y.o. female.  HPI: Patient presents for evaluation of gallstones.  She was first diagnosed with gallstones in 2008.  She currently complains of epigastric and right upper quadrant abdominal pain that is constant in nature.  She has occasional associated nausea and vomiting.  She states that she has a history of gallstone pancreatitis but had never followed up with a general surgeon regarding excision of her gallbladder.  She did try low-fat diet for a little while, and this did help her symptoms, though she thought she was losing too much weight.  Her past medical history significant for diabetes, chronic kidney disease, hyperlipidemia, left breast cancer, and GERD.  Her surgical history is significant for bilateral mastectomy with reconstruction 2008 for her left breast cancer, and D&C.  She smokes 1/2 pack of cigarettes per day.  She denies use of alcohol and illicit drugs.  She denies use of blood thinning medications.  She is currently on Jardiance.       Past Medical History:  Diagnosis Date   Breast cancer ([place])     Depression     Diabetes mellitus     Gall bladder stones     Migraines             Past Surgical History:  Procedure Laterality Date   BREAST SURGERY       MASTECTOMY        Bilateral   MASTECTOMY Bilateral             Family History  Problem Relation Age of Onset   Cancer Mother     Heart failure Mother     Diabetes Father     Heart failure Father     Cancer Father     Stroke Father        Social History         Tobacco Use   Smoking status: Every Day      Packs/day: 0.25      Years: 17.00      Total pack years: 4.25      Types: Cigarettes   Smokeless tobacco: Never  Vaping Use   Vaping Use: Never used  Substance Use Topics   Alcohol use: No       Alcohol/week: 0.0 standard drinks of alcohol   Drug use: No      Medications: I have reviewed the patient's current medications. Allergies as of 02/05/2023         Reactions    Nitrofurantoin Itching, Rash    Elavil [amitriptyline]      Felt bad and seemed to cause UTIs    Lipitor [atorvastatin]      Muscle pain and nausea    Morphine And Related Other (See Comments)    Does not like side effects    Other      Pt does not want narcotics    Statins      Body aches    Zofran [ondansetron Hcl] Nausea And Vomiting            Medication List           Accurate as of February 05, 2023  9:04 AM. If you have any questions, ask your nurse or doctor.  acetaminophen 500 MG tablet Commonly known as: TYLENOL Take 1,000 mg by mouth every 6 (six) hours as needed.    ascorbic acid 500 MG tablet Commonly known as: VITAMIN C Take 500 mg by mouth daily.    citalopram 40 MG tablet Commonly known as: CELEXA Take 1 tablet (40 mg total) by mouth daily.    empagliflozin 10 MG Tabs tablet Commonly known as: JARDIANCE Take 1 tablet (10 mg total) by mouth daily.    gabapentin 300 MG capsule Commonly known as: NEURONTIN Take 1 capsule (300 mg total) by mouth 3 (three) times daily.    metFORMIN 1000 MG tablet Commonly known as: GLUCOPHAGE Take 1 tablet (1,000 mg total) by mouth 2 (two) times daily with a meal.    multivitamin with minerals Tabs tablet Take 1 tablet by mouth daily.    rosuvastatin 20 MG tablet Commonly known as: Crestor Take 1 tablet (20 mg total) by mouth daily.    triamcinolone cream 0.1 % Commonly known as: KENALOG Apply 1 application topically 2 (two) times daily.               ROS:  Constitutional: negative for chills, fatigue, and fevers Eyes: negative for visual disturbance and pain Ears, nose, mouth, throat, and face: negative for ear drainage, sore throat, and sinus problems Respiratory: negative for cough, wheezing, and shortness of  breath Cardiovascular: negative for chest pain and palpitations Gastrointestinal: positive for abdominal pain and nausea, negative for reflux symptoms and vomiting Genitourinary:positive for frequency, negative for dysuria Integument/breast: negative for dryness and rash Hematologic/lymphatic: negative for bleeding and lymphadenopathy Musculoskeletal:positive for joint pain, negative for back pain and neck pain Neurological: negative for dizziness and tremors Endocrine: negative for temperature intolerance   Blood pressure 101/70, pulse 93, temperature 98 F (36.7 C), temperature source Oral, resp. rate 16, height '5\' 6"'$  (1.676 m), weight 142 lb (64.4 kg), SpO2 93 %. Physical Exam Vitals reviewed.  Constitutional:      Appearance: Normal appearance.  HENT:     Head: Normocephalic and atraumatic.  Eyes:     Extraocular Movements: Extraocular movements intact.     Pupils: Pupils are equal, round, and reactive to light.  Cardiovascular:     Rate and Rhythm: Normal rate and regular rhythm.  Pulmonary:     Effort: Pulmonary effort is normal.     Breath sounds: Normal breath sounds.  Abdominal:     Comments: Abdomen soft, nondistended, no percussion tenderness, mild TTP in epigastrium and RUQ; no rigidity, guarding or rebound tenderness; negative Murphy's  Musculoskeletal:        General: Normal range of motion.     Cervical back: Normal range of motion.  Skin:    General: Skin is warm and dry.  Neurological:     General: No focal deficit present.     Mental Status: She is alert and oriented to person, place, and time.  Psychiatric:        Mood and Affect: Mood normal.        Behavior: Behavior normal.        Results: Abdominal Ultrasound (11/25/2015): IMPRESSION: Cholelithiasis. No gallbladder wall thickening. A small cyst seen in the right lobe of the liver on CT is not appreciable by ultrasound. No focal liver lesions are seen by ultrasound.   Assessment & Plan:  Makayla Owens is a 62 y.o. female who presents for evaluation of gallstones   -Explained the pathophysiology of gallbladder disease, and why recommend surgical excision -I  counseled the patient about the indication, risks and benefits of robotic assisted laparoscopic cholecystectomy.  She understands there is a very small chance for bleeding, infection, injury to normal structures (including common bile duct), conversion to open surgery, persistent symptoms, evolution of postcholecystectomy diarrhea, need for secondary interventions, anesthesia reaction, cardiopulmonary issues and other risks not specifically detailed here. I described the expected recovery, the plan for follow-up and the restrictions during the recovery phase.  All questions were answered. -Patient tentatively scheduled for surgery on 3/25 -Information provided regarding cholelithiasis, cholecystectomy, and low-fat diet -Advised her to present to the emergency department if she begins to have severe epigastric/right upper quadrant abdominal pain, nausea, vomiting, fever, and chills   All questions were answered to the satisfaction of the patient.   Graciella Freer, DO [facility] [address] [place], [place] [address] [phone] (office)

## 2023-02-19 NOTE — Patient Instructions (Signed)
Makayla Owens  02/19/2023     @PREFPERIOPPHARMACY @   Your procedure is scheduled on  02/25/2023.   Report to [facility] at  [phone]  A.M.   Call this number if you have problems the morning of surgery:  [phone]  If you experience any cold or flu symptoms such as cough, fever, chills, shortness of breath, etc. between now and your scheduled surgery, please notify us at the above number.   Remember:  Do not eat or drink after midnight.       Your last dose of jardiance should be on 02/21/2023.       DO NOT take any medications for diabetes the morning of your procedure.     Take these medicines the morning of surgery with A SIP OF WATER                                 celexa, gabapentin.     Do not wear jewelry, make-up or nail polish.  Do not wear lotions, powders, or perfumes, or deodorant.  Do not shave 48 hours prior to surgery.  Men may shave face and neck.  Do not bring valuables to the hospital.  [facility] is not responsible for any belongings or valuables.  Contacts, dentures or bridgework may not be worn into surgery.  Leave your suitcase in the car.  After surgery it may be brought to your room.  For patients admitted to the hospital, discharge time will be determined by your treatment team.  Patients discharged the day of surgery will not be allowed to drive home and must have someone with them for 24 hours.    Special instructions:   DO NOT smoke tobacco or vape for 24 hours before your procedure.  Please read over the following fact sheets that you were given. Coughing and Deep Breathing, Surgical Site Infection Prevention, Anesthesia Post-op Instructions, and Care and Recovery After Surgery      Minimally Invasive Cholecystectomy, Care After The following information offers guidance on how to care for yourself after your procedure. Your health care provider may also give you more specific instructions. If you have problems or questions,  contact your health care provider. What can I expect after the procedure? After the procedure, it is common to have: Pain at your incision sites. You will be given medicines to control this pain. Mild nausea or vomiting. Bloating and possible shoulder pain from the gas that was used during the procedure. Follow these instructions at home: Medicines Take over-the-counter and prescription medicines only as told by your health care provider. If you were prescribed an antibiotic medicine, take it as told by your health care provider. Do not stop using the antibiotic even if you start to feel better. Ask your health care provider if the medicine prescribed to you: Requires you to avoid driving or using machinery. Can cause constipation. You may need to take these actions to prevent or treat constipation: Drink enough fluid to keep your urine pale yellow. Take over-the-counter or prescription medicines. Eat foods that are high in fiber, such as beans, whole grains, and fresh fruits and vegetables. Limit foods that are high in fat and processed sugars, such as fried or sweet foods. Incision care  Follow instructions from your health care provider about how to take care of your incisions. Make sure you: Wash your hands with soap and  water for at least 20 seconds before and after you change your bandage (dressing). If soap and water are not available, use hand sanitizer. Change your dressing as told by your health care provider. Leave stitches (sutures), skin glue, or adhesive strips in place. These skin closures may need to be in place for 2 weeks or longer. If adhesive strip edges start to loosen and curl up, you may trim the loose edges. Do not remove adhesive strips completely unless your health care provider tells you to do that. Do not take baths, swim, or use a hot tub until your health care provider approves. Ask your health care provider if you may take showers. You may only be allowed to take  sponge baths. Check your incision area every day for signs of infection. Check for: More redness, swelling, or pain. Fluid or blood. Warmth. Pus or a bad smell. Activity Rest as told by your health care provider. Do not do activities that require a lot of effort. Avoid sitting for a long time without moving. Get up to take short walks every 1-2 hours. This is important to improve blood flow and breathing. Ask for help if you feel weak or unsteady. Do not lift anything that is heavier than 10 lb (4.5 kg), or the limit that you are told, until your health care provider says that it is safe. Do not play contact sports until your health care provider approves. Do not return to work or school until your health care provider approves. Return to your normal activities as told by your health care provider. Ask your health care provider what activities are safe for you. General instructions If you were given a sedative during the procedure, it can affect you for several hours. Do not drive or operate machinery until your health care provider says that it is safe. Keep all follow-up visits. This is important. Contact a health care provider if: You develop a rash. You have more redness, swelling, or pain around your incisions. You have fluid or blood coming from your incisions. Your incisions feel warm to the touch. You have pus or a bad smell coming from your incisions. You have a fever. One or more of your incisions breaks open. Get help right away if: You have trouble breathing. You have chest pain. You have more pain in your shoulders. You faint or feel dizzy when you stand. You have severe pain in your abdomen. You have nausea or vomiting that lasts for more than one day. You have leg pain that is new or unusual, or if it is localized to one specific spot. These symptoms may represent a serious problem that is an emergency. Do not wait to see if the symptoms will go away. Get medical help  right away. Call your local emergency services (911 in the [place] Do not drive yourself to the hospital. Summary After your procedure, it is common to have pain at the incision sites. You may also have nausea or bloating. Follow your health care provider's instructions about medicine, activity restrictions, and caring for your incision areas. Do not do activities that require a lot of effort. Contact a health care provider if you have a fever or other signs of infection, such as more redness, swelling, or pain around the incisions. Get help right away if you have chest pain, increasing pain in the shoulders, or trouble breathing. This information is not intended to replace advice given to you by your health care provider. Make sure you discuss  any questions you have with your health care provider. Document Revised: 05/23/2021 Document Reviewed: 05/23/2021 Elsevier Patient Education  [place] Anesthesia, Adult, Care After The following information offers guidance on how to care for yourself after your procedure. Your health care provider may also give you more specific instructions. If you have problems or questions, contact your health care provider. What can I expect after the procedure? After the procedure, it is common for people to: Have pain or discomfort at the IV site. Have nausea or vomiting. Have a sore throat or hoarseness. Have trouble concentrating. Feel cold or chills. Feel weak, sleepy, or tired (fatigue). Have soreness and body aches. These can affect parts of the body that were not involved in surgery. Follow these instructions at home: For the time period you were told by your health care provider:  Rest. Do not participate in activities where you could fall or become injured. Do not drive or use machinery. Do not drink alcohol. Do not take sleeping pills or medicines that cause drowsiness. Do not make important decisions or sign legal documents. Do  not take care of children on your own. General instructions Drink enough fluid to keep your urine pale yellow. If you have sleep apnea, surgery and certain medicines can increase your risk for breathing problems. Follow instructions from your health care provider about wearing your sleep device: Anytime you are sleeping, including during daytime naps. While taking prescription pain medicines, sleeping medicines, or medicines that make you drowsy. Return to your normal activities as told by your health care provider. Ask your health care provider what activities are safe for you. Take over-the-counter and prescription medicines only as told by your health care provider. Do not use any products that contain nicotine or tobacco. These products include cigarettes, chewing tobacco, and vaping devices, such as e-cigarettes. These can delay incision healing after surgery. If you need help quitting, ask your health care provider. Contact a health care provider if: You have nausea or vomiting that does not get better with medicine. You vomit every time you eat or drink. You have pain that does not get better with medicine. You cannot urinate or have bloody urine. You develop a skin rash. You have a fever. Get help right away if: You have trouble breathing. You have chest pain. You vomit blood. These symptoms may be an emergency. Get help right away. Call 911. Do not wait to see if the symptoms will go away. Do not drive yourself to the hospital. Summary After the procedure, it is common to have a sore throat, hoarseness, nausea, vomiting, or to feel weak, sleepy, or fatigue. For the time period you were told by your health care provider, do not drive or use machinery. Get help right away if you have difficulty breathing, have chest pain, or vomit blood. These symptoms may be an emergency. This information is not intended to replace advice given to you by your health care provider. Make sure you  discuss any questions you have with your health care provider. Document Revised: 02/16/2022 Document Reviewed: 02/16/2022 Elsevier Patient Education  [place]. How to Use Chlorhexidine Before Surgery Chlorhexidine gluconate (CHG) is a germ-killing (antiseptic) solution that is used to clean the skin. It can get rid of the bacteria that normally live on the skin and can keep them away for about 24 hours. To clean your skin with CHG, you may be given: A CHG solution to use in the shower or as part of  a sponge bath. A prepackaged cloth that contains CHG. Cleaning your skin with CHG may help lower the risk for infection: While you are staying in the intensive care unit of the hospital. If you have a vascular access, such as a central line, to provide short-term or long-term access to your veins. If you have a catheter to drain urine from your bladder. If you are on a ventilator. A ventilator is a machine that helps you breathe by moving air in and out of your lungs. After surgery. What are the risks? Risks of using CHG include: A skin reaction. Hearing loss, if CHG gets in your ears and you have a perforated eardrum. Eye injury, if CHG gets in your eyes and is not rinsed out. The CHG product catching fire. Make sure that you avoid smoking and flames after applying CHG to your skin. Do not use CHG: If you have a chlorhexidine allergy or have previously reacted to chlorhexidine. On babies younger than 65 months of age. How to use CHG solution Use CHG only as told by your health care provider, and follow the instructions on the label. Use the full amount of CHG as directed. Usually, this is one bottle. During a shower Follow these steps when using CHG solution during a shower (unless your health care provider gives you different instructions): Start the shower. Use your normal soap and shampoo to wash your face and hair. Turn off the shower or move out of the shower stream. Pour the  CHG onto a clean washcloth. Do not use any type of brush or rough-edged sponge. Starting at your neck, lather your body down to your toes. Make sure you follow these instructions: If you will be having surgery, pay special attention to the part of your body where you will be having surgery. Scrub this area for at least 1 minute. Do not use CHG on your head or face. If the solution gets into your ears or eyes, rinse them well with water. Avoid your genital area. Avoid any areas of skin that have broken skin, cuts, or scrapes. Scrub your back and under your arms. Make sure to wash skin folds. Let the lather sit on your skin for 1-2 minutes or as long as told by your health care provider. Thoroughly rinse your entire body in the shower. Make sure that all body creases and crevices are rinsed well. Dry off with a clean towel. Do not put any substances on your body afterward--such as powder, lotion, or perfume--unless you are told to do so by your health care provider. Only use lotions that are recommended by the manufacturer. Put on clean clothes or pajamas. If it is the night before your surgery, sleep in clean sheets.  During a sponge bath Follow these steps when using CHG solution during a sponge bath (unless your health care provider gives you different instructions): Use your normal soap and shampoo to wash your face and hair. Pour the CHG onto a clean washcloth. Starting at your neck, lather your body down to your toes. Make sure you follow these instructions: If you will be having surgery, pay special attention to the part of your body where you will be having surgery. Scrub this area for at least 1 minute. Do not use CHG on your head or face. If the solution gets into your ears or eyes, rinse them well with water. Avoid your genital area. Avoid any areas of skin that have broken skin, cuts, or scrapes. Scrub your back  and under your arms. Make sure to wash skin folds. Let the lather sit on  your skin for 1-2 minutes or as long as told by your health care provider. Using a different clean, wet washcloth, thoroughly rinse your entire body. Make sure that all body creases and crevices are rinsed well. Dry off with a clean towel. Do not put any substances on your body afterward--such as powder, lotion, or perfume--unless you are told to do so by your health care provider. Only use lotions that are recommended by the manufacturer. Put on clean clothes or pajamas. If it is the night before your surgery, sleep in clean sheets. How to use CHG prepackaged cloths Only use CHG cloths as told by your health care provider, and follow the instructions on the label. Use the CHG cloth on clean, dry skin. Do not use the CHG cloth on your head or face unless your health care provider tells you to. When washing with the CHG cloth: Avoid your genital area. Avoid any areas of skin that have broken skin, cuts, or scrapes. Before surgery Follow these steps when using a CHG cloth to clean before surgery (unless your health care provider gives you different instructions): Using the CHG cloth, vigorously scrub the part of your body where you will be having surgery. Scrub using a back-and-forth motion for 3 minutes. The area on your body should be completely wet with CHG when you are done scrubbing. Do not rinse. Discard the cloth and let the area air-dry. Do not put any substances on the area afterward, such as powder, lotion, or perfume. Put on clean clothes or pajamas. If it is the night before your surgery, sleep in clean sheets.  For general bathing Follow these steps when using CHG cloths for general bathing (unless your health care provider gives you different instructions). Use a separate CHG cloth for each area of your body. Make sure you wash between any folds of skin and between your fingers and toes. Wash your body in the following order, switching to a new cloth after each step: The front of  your neck, shoulders, and chest. Both of your arms, under your arms, and your hands. Your stomach and groin area, avoiding the genitals. Your right leg and foot. Your left leg and foot. The back of your neck, your back, and your buttocks. Do not rinse. Discard the cloth and let the area air-dry. Do not put any substances on your body afterward--such as powder, lotion, or perfume--unless you are told to do so by your health care provider. Only use lotions that are recommended by the manufacturer. Put on clean clothes or pajamas. Contact a health care provider if: Your skin gets irritated after scrubbing. You have questions about using your solution or cloth. You swallow any chlorhexidine. Call your local poison control center (1-416-420-2940 in the [place] Get help right away if: Your eyes itch badly, or they become very red or swollen. Your skin itches badly and is red or swollen. Your hearing changes. You have trouble seeing. You have swelling or tingling in your mouth or throat. You have trouble breathing. These symptoms may represent a serious problem that is an emergency. Do not wait to see if the symptoms will go away. Get medical help right away. Call your local emergency services (911 in the [place] Do not drive yourself to the hospital. Summary Chlorhexidine gluconate (CHG) is a germ-killing (antiseptic) solution that is used to clean the skin. Cleaning your skin with CHG may  help to lower your risk for infection. You may be given CHG to use for bathing. It may be in a bottle or in a prepackaged cloth to use on your skin. Carefully follow your health care provider's instructions and the instructions on the product label. Do not use CHG if you have a chlorhexidine allergy. Contact your health care provider if your skin gets irritated after scrubbing. This information is not intended to replace advice given to you by your health care provider. Make sure you discuss any questions you have  with your health care provider. Document Revised: 03/19/2022 Document Reviewed: 01/30/2021 Elsevier Patient Education  [place].

## 2023-02-21 ENCOUNTER — Encounter (HOSPITAL_COMMUNITY)
Admission: RE | Admit: 2023-02-21 | Discharge: 2023-02-21 | Disposition: A | Discharge status: Home / Self Care | Payer: Medicaid Other | Source: Ambulatory Visit | Attending: Surgery | Admitting: Surgery

## 2023-02-21 ENCOUNTER — Encounter (HOSPITAL_COMMUNITY): Payer: Self-pay

## 2023-02-21 VITALS — BP 105/76 | HR 95 | Temp 98.0°F | Resp 18 | Ht 66.0 in | Wt 142.0 lb

## 2023-02-21 DIAGNOSIS — N181 Chronic kidney disease, stage 1: Secondary | ICD-10-CM | POA: Insufficient documentation

## 2023-02-21 DIAGNOSIS — E1140 Type 2 diabetes mellitus with diabetic neuropathy, unspecified: Secondary | ICD-10-CM

## 2023-02-21 DIAGNOSIS — E1122 Type 2 diabetes mellitus with diabetic chronic kidney disease: Secondary | ICD-10-CM | POA: Diagnosis not present

## 2023-02-21 DIAGNOSIS — I447 Left bundle-branch block, unspecified: Secondary | ICD-10-CM | POA: Diagnosis not present

## 2023-02-21 DIAGNOSIS — Z01810 Encounter for preprocedural cardiovascular examination: Secondary | ICD-10-CM | POA: Insufficient documentation

## 2023-02-21 HISTORY — DX: Chronic kidney disease, unspecified: N18.9

## 2023-02-25 ENCOUNTER — Ambulatory Visit (HOSPITAL_COMMUNITY): Payer: Medicaid Other | Admitting: Anesthesiology

## 2023-02-25 ENCOUNTER — Other Ambulatory Visit: Payer: Self-pay

## 2023-02-25 ENCOUNTER — Encounter (HOSPITAL_COMMUNITY): Payer: Self-pay | Admitting: Surgery

## 2023-02-25 ENCOUNTER — Ambulatory Visit (HOSPITAL_BASED_OUTPATIENT_CLINIC_OR_DEPARTMENT_OTHER): Payer: Medicaid Other | Admitting: Anesthesiology

## 2023-02-25 ENCOUNTER — Ambulatory Visit (HOSPITAL_COMMUNITY)
Admission: RE | Admit: 2023-02-25 | Discharge: 2023-02-25 | Disposition: A | Discharge status: Home / Self Care | Payer: Medicaid Other | Attending: Surgery | Admitting: Surgery

## 2023-02-25 ENCOUNTER — Encounter (HOSPITAL_COMMUNITY)
Admission: RE | Disposition: A | Discharge status: Home / Self Care | Payer: Self-pay | Source: Home / Self Care | Attending: Surgery

## 2023-02-25 DIAGNOSIS — K8020 Calculus of gallbladder without cholecystitis without obstruction: Secondary | ICD-10-CM | POA: Diagnosis not present

## 2023-02-25 DIAGNOSIS — K828 Other specified diseases of gallbladder: Secondary | ICD-10-CM | POA: Diagnosis not present

## 2023-02-25 DIAGNOSIS — F17210 Nicotine dependence, cigarettes, uncomplicated: Secondary | ICD-10-CM

## 2023-02-25 DIAGNOSIS — E1140 Type 2 diabetes mellitus with diabetic neuropathy, unspecified: Secondary | ICD-10-CM | POA: Insufficient documentation

## 2023-02-25 DIAGNOSIS — Z7984 Long term (current) use of oral hypoglycemic drugs: Secondary | ICD-10-CM | POA: Insufficient documentation

## 2023-02-25 DIAGNOSIS — N189 Chronic kidney disease, unspecified: Secondary | ICD-10-CM | POA: Insufficient documentation

## 2023-02-25 DIAGNOSIS — E785 Hyperlipidemia, unspecified: Secondary | ICD-10-CM | POA: Diagnosis not present

## 2023-02-25 DIAGNOSIS — Z853 Personal history of malignant neoplasm of breast: Secondary | ICD-10-CM | POA: Insufficient documentation

## 2023-02-25 DIAGNOSIS — F32A Depression, unspecified: Secondary | ICD-10-CM | POA: Diagnosis not present

## 2023-02-25 DIAGNOSIS — Z9013 Acquired absence of bilateral breasts and nipples: Secondary | ICD-10-CM | POA: Diagnosis not present

## 2023-02-25 DIAGNOSIS — E1122 Type 2 diabetes mellitus with diabetic chronic kidney disease: Secondary | ICD-10-CM | POA: Diagnosis not present

## 2023-02-25 DIAGNOSIS — M797 Fibromyalgia: Secondary | ICD-10-CM | POA: Insufficient documentation

## 2023-02-25 DIAGNOSIS — K8000 Calculus of gallbladder with acute cholecystitis without obstruction: Secondary | ICD-10-CM | POA: Diagnosis not present

## 2023-02-25 DIAGNOSIS — I447 Left bundle-branch block, unspecified: Secondary | ICD-10-CM | POA: Diagnosis not present

## 2023-02-25 LAB — GLUCOSE, CAPILLARY
Glucose-Capillary: 115 mg/dL — ABNORMAL HIGH (ref 70–99)
Glucose-Capillary: 172 mg/dL — ABNORMAL HIGH (ref 70–99)

## 2023-02-25 SURGERY — CHOLECYSTECTOMY, ROBOT-ASSISTED, LAPAROSCOPIC
Anesthesia: General | Site: Abdomen

## 2023-02-25 MED ORDER — CHLORHEXIDINE GLUCONATE CLOTH 2 % EX PADS: 6.0000 | MEDICATED_PAD | Freq: Once | CUTANEOUS | Status: DC

## 2023-02-25 MED ORDER — MIDAZOLAM HCL 2 MG/2ML IJ SOLN
INTRAMUSCULAR | Status: AC
  Filled 2023-02-25: qty 2

## 2023-02-25 MED ORDER — PROMETHAZINE HCL 25 MG/ML IJ SOLN
INTRAMUSCULAR | Status: AC
  Filled 2023-02-25: qty 1

## 2023-02-25 MED ORDER — MEPERIDINE HCL 50 MG/ML IJ SOLN: 6.2500 mg | INTRAMUSCULAR | Status: DC | PRN

## 2023-02-25 MED ORDER — FENTANYL CITRATE (PF) 250 MCG/5ML IJ SOLN
INTRAMUSCULAR | Status: AC
  Filled 2023-02-25: qty 5

## 2023-02-25 MED ORDER — BUPIVACAINE LIPOSOME 1.3 % IJ SUSP
INTRAMUSCULAR | Status: AC
  Filled 2023-02-25: qty 20

## 2023-02-25 MED ORDER — LIDOCAINE HCL (PF) 2 % IJ SOLN
INTRAMUSCULAR | Status: AC
  Filled 2023-02-25: qty 10

## 2023-02-25 MED ORDER — SUGAMMADEX SODIUM 200 MG/2ML IV SOLN
INTRAVENOUS | Status: DC | PRN
  Administered 2023-02-25: 128.8 mg via INTRAVENOUS

## 2023-02-25 MED ORDER — ACETAMINOPHEN 500 MG PO TABS: 1000.0000 mg | ORAL_TABLET | Freq: Four times a day (QID) | ORAL | 0 refills | Status: AC

## 2023-02-25 MED ORDER — PROMETHAZINE HCL 12.5 MG PO TABS: 12.5000 mg | ORAL_TABLET | Freq: Four times a day (QID) | ORAL | 0 refills | Status: DC | PRN

## 2023-02-25 MED ORDER — DEXAMETHASONE SODIUM PHOSPHATE 10 MG/ML IJ SOLN
INTRAMUSCULAR | Status: AC
  Filled 2023-02-25: qty 2

## 2023-02-25 MED ORDER — ORAL CARE MOUTH RINSE: 15.0000 mL | Freq: Once | OROMUCOSAL | Status: AC

## 2023-02-25 MED ORDER — DEXAMETHASONE SODIUM PHOSPHATE 10 MG/ML IJ SOLN
INTRAMUSCULAR | Status: DC | PRN
  Administered 2023-02-25: 10 mg via INTRAVENOUS

## 2023-02-25 MED ORDER — BUPIVACAINE LIPOSOME 1.3 % IJ SUSP
INTRAMUSCULAR | Status: DC | PRN
  Administered 2023-02-25: 20 mL

## 2023-02-25 MED ORDER — PROPOFOL 10 MG/ML IV BOLUS
INTRAVENOUS | Status: DC | PRN
  Administered 2023-02-25: 120 mg via INTRAVENOUS

## 2023-02-25 MED ORDER — OXYCODONE HCL 5 MG PO TABS: 5.0000 mg | ORAL_TABLET | Freq: Four times a day (QID) | ORAL | 0 refills | Status: DC | PRN

## 2023-02-25 MED ORDER — LACTATED RINGERS IV SOLN: INTRAVENOUS | Status: DC

## 2023-02-25 MED ORDER — FENTANYL CITRATE (PF) 250 MCG/5ML IJ SOLN
INTRAMUSCULAR | Status: DC | PRN
  Administered 2023-02-25: 100 ug via INTRAVENOUS
  Administered 2023-02-25: 50 ug via INTRAVENOUS

## 2023-02-25 MED ORDER — DOCUSATE SODIUM 100 MG PO CAPS: 100.0000 mg | ORAL_CAPSULE | Freq: Two times a day (BID) | ORAL | 2 refills | Status: DC

## 2023-02-25 MED ORDER — SODIUM CHLORIDE 0.9 % IV SOLN
2.0000 g | INTRAVENOUS | Status: AC
  Administered 2023-02-25: 2 g via INTRAVENOUS
  Filled 2023-02-25: qty 2

## 2023-02-25 MED ORDER — PROPOFOL 500 MG/50ML IV EMUL
INTRAVENOUS | Status: DC | PRN
  Administered 2023-02-25: 50 ug/kg/min via INTRAVENOUS

## 2023-02-25 MED ORDER — ONDANSETRON HCL 4 MG/2ML IJ SOLN
INTRAMUSCULAR | Status: DC | PRN
  Administered 2023-02-25: 4 mg via INTRAVENOUS

## 2023-02-25 MED ORDER — LIDOCAINE HCL (CARDIAC) PF 100 MG/5ML IV SOSY
PREFILLED_SYRINGE | INTRAVENOUS | Status: DC | PRN
  Administered 2023-02-25: 60 mg via INTRATRACHEAL

## 2023-02-25 MED ORDER — INDOCYANINE GREEN 25 MG IV SOLR: 2.5000 mg | Freq: Once | INTRAVENOUS | Status: AC

## 2023-02-25 MED ORDER — STERILE WATER FOR IRRIGATION IR SOLN
Status: DC | PRN
  Administered 2023-02-25: 500 mL

## 2023-02-25 MED ORDER — CHLORHEXIDINE GLUCONATE 0.12 % MT SOLN
15.0000 mL | Freq: Once | OROMUCOSAL | Status: AC
  Administered 2023-02-25: 15 mL via OROMUCOSAL

## 2023-02-25 MED ORDER — HYDROMORPHONE HCL 1 MG/ML IJ SOLN
0.2500 mg | INTRAMUSCULAR | Status: DC | PRN
  Administered 2023-02-25 (×2): 0.5 mg via INTRAVENOUS
  Filled 2023-02-25 (×2): qty 0.5

## 2023-02-25 MED ORDER — INDOCYANINE GREEN 25 MG IV SOLR
INTRAVENOUS | Status: AC
  Administered 2023-02-25: 2.5 mg via INTRAVENOUS
  Filled 2023-02-25: qty 10

## 2023-02-25 MED ORDER — ONDANSETRON HCL 4 MG/2ML IJ SOLN
INTRAMUSCULAR | Status: AC
  Filled 2023-02-25: qty 4

## 2023-02-25 MED ORDER — ROCURONIUM BROMIDE 10 MG/ML (PF) SYRINGE
PREFILLED_SYRINGE | INTRAVENOUS | Status: AC
  Filled 2023-02-25: qty 20

## 2023-02-25 MED ORDER — MIDAZOLAM HCL 5 MG/5ML IJ SOLN
INTRAMUSCULAR | Status: DC | PRN
  Administered 2023-02-25: 2 mg via INTRAVENOUS

## 2023-02-25 MED ORDER — PROMETHAZINE HCL 25 MG/ML IJ SOLN
INTRAMUSCULAR | Status: DC | PRN
  Administered 2023-02-25: 12.5 mg via INTRAVENOUS

## 2023-02-25 MED ORDER — ROCURONIUM BROMIDE 100 MG/10ML IV SOLN
INTRAVENOUS | Status: DC | PRN
  Administered 2023-02-25: 80 mg via INTRAVENOUS

## 2023-02-25 MED ORDER — PHENYLEPHRINE HCL-NACL 20-0.9 MG/250ML-% IV SOLN
INTRAVENOUS | Status: DC | PRN
  Administered 2023-02-25: 50 ug/min via INTRAVENOUS

## 2023-02-25 SURGICAL SUPPLY — 42 items
ADH SKN CLS APL DERMABOND .7 (GAUZE/BANDAGES/DRESSINGS) ×1
APL PRP STRL LF DISP 70% ISPRP (MISCELLANEOUS) ×1
BLADE SURG 15 STRL LF DISP TIS (BLADE) ×1 IMPLANT
BLADE SURG 15 STRL SS (BLADE) ×1
CHLORAPREP W/TINT 26 (MISCELLANEOUS) ×1 IMPLANT
CLIP LIGATING HEM O LOK PURPLE (MISCELLANEOUS) ×1 IMPLANT
COVER TIP SHEARS 8 DVNC (MISCELLANEOUS) ×1 IMPLANT
COVER TIP SHEARS 8MM DA VINCI (MISCELLANEOUS) ×1
DEFOGGER SCOPE WARMER CLEARIFY (MISCELLANEOUS) IMPLANT
DERMABOND ADVANCED .7 DNX12 (GAUZE/BANDAGES/DRESSINGS) ×1 IMPLANT
DRAPE ARM DVNC X/XI (DISPOSABLE) ×4 IMPLANT
DRAPE COLUMN DVNC XI (DISPOSABLE) ×1 IMPLANT
DRAPE DA VINCI XI ARM (DISPOSABLE) ×4
DRAPE DA VINCI XI COLUMN (DISPOSABLE) ×1
ELECT REM PT RETURN 9FT ADLT (ELECTROSURGICAL) ×1
ELECTRODE REM PT RTRN 9FT ADLT (ELECTROSURGICAL) ×1 IMPLANT
GLOVE BIO SURGEON STRL SZ7 (GLOVE) IMPLANT
GLOVE BIOGEL PI IND STRL 6.5 (GLOVE) ×2 IMPLANT
GLOVE BIOGEL PI IND STRL 7.0 (GLOVE) ×3 IMPLANT
GLOVE SURG SS PI 6.5 STRL IVOR (GLOVE) ×2 IMPLANT
GOWN STRL REUS W/TWL LRG LVL3 (GOWN DISPOSABLE) ×3 IMPLANT
MANIFOLD NEPTUNE II (INSTRUMENTS) ×1 IMPLANT
NDL HYPO 21X1.5 SAFETY (NEEDLE) ×1 IMPLANT
NDL INSUFFLATION 14GA 120MM (NEEDLE) ×1 IMPLANT
NEEDLE HYPO 21X1.5 SAFETY (NEEDLE) ×1 IMPLANT
NEEDLE INSUFFLATION 14GA 120MM (NEEDLE) ×1 IMPLANT
OBTURATOR OPTICAL STANDARD 8MM (TROCAR) ×1
OBTURATOR OPTICAL STND 8 DVNC (TROCAR) ×1
OBTURATOR OPTICALSTD 8 DVNC (TROCAR) ×1 IMPLANT
PACK LAP CHOLE LZT030E (CUSTOM PROCEDURE TRAY) ×1 IMPLANT
PAD ARMBOARD 7.5X6 YLW CONV (MISCELLANEOUS) ×1 IMPLANT
PENCIL HANDSWITCHING (ELECTRODE) IMPLANT
SEAL CANN UNIV 5-8 DVNC XI (MISCELLANEOUS) ×3 IMPLANT
SEAL XI 5MM-8MM UNIVERSAL (MISCELLANEOUS) ×4
SET BASIN LINEN APH (SET/KITS/TRAYS/PACK) ×1 IMPLANT
SET TUBE SMOKE EVAC HIGH FLOW (TUBING) ×1 IMPLANT
SUT MNCRL AB 4-0 PS2 18 (SUTURE) ×2 IMPLANT
SUT VICRYL 0 AB UR-6 (SUTURE) IMPLANT
SYR 20ML LL LF (SYRINGE) ×1 IMPLANT
SYS RETRIEVAL 5MM INZII UNIV (BASKET) ×1
SYSTEM RETRIEVL 5MM INZII UNIV (BASKET) IMPLANT
WATER STERILE IRR 500ML POUR (IV SOLUTION) ×1 IMPLANT

## 2023-02-25 NOTE — Op Note (Signed)
[facility] Operative Note  02/25/23  Preoperative Diagnosis: Symptomatic Cholelithiasis   Postoperative Diagnosis: Same   Procedure(s) Performed: Robotic Assisted Laparoscopic Cholecystectomy   Surgeon: Graciella Freer, DO   Assistants: No qualified resident was available    Anesthesia: General endotracheal   Anesthesiologist: Denese Killings, MD    Specimens: Gallbladder   Estimated Blood Loss: Minimal   Blood Replacement: None    Complications: None   Wound Class: Clean contaminated   Operative Indications: The patient was found to have cholelithiasis on imaging and was symptomatic.  We discussed the risk of the procedure including but not limited to bleeding, infection, injury to the common bile duct, bile leak, need for further procedures, chance of subtotal cholecystectomy.   Findings:  Distended, long gallbladder without acute inflammation Critical view of safety noted All clips intact at the end of the case Adequate hemostasis   Procedure: Firefly was given in the preoperative area. The patient was taken to the operating room and placed supine. General endotracheal anesthesia was induced. Intravenous antibiotics were administered per protocol.  An orogastric tube positioned to decompress the stomach. The abdomen was prepared and draped in the usual sterile fashion.   Veress needle was placed at the infraumbilical area and insufflation was started after confirming a positive saline drop test and no immediate increase in abdominal pressure.  After reaching 15 mm, the Veress needle was removed and a 8 mm port was placed via optiview technique infraumbilical, measuring 20 mm away from the suspected position of the gallbladder.  The abdomen was inspected and no abnormalities or injuries were found.  Under direct vision, ports were placed in the following locations in a semi curvilinear position around the target of the gallbladder: Two 8 mm ports  on the patient's right each having 8cm clearance to the adjacent ports and one 8 mm port placed on the patient's left 8 cm from the umbilical port. Once ports were placed, the table was placed in the reverse Trendelenburg position with the right side up. The Xi platform was brought into the operative field and docked to the ports successfully.  An endoscope was placed through the umbilical port, prograsp through the most lateral right port, fenestrated bipolar to the port just right of the umbilicus, and then a hook cautery in the left port.  The dome of the gallbladder was grasped with prograsp and retracted over the dome of the liver. Adhesions between the gallbladder and omentum, duodenum and transverse colon were lysed via hook cautery. The infundibulum was grasped with the fenestrated grasper and retracted toward the right lower quadrant. This maneuver exposed Calot's triangle. Firefly was used throughout the dissection to ensure safe visualization of the cystic duct.  The peritoneum overlying the gallbladder infundibulum was then dissected and the cystic duct and cystic artery identified.  Critical view of safety with the liver bed clearly visible behind the duct and artery with no additional structures noted.  The cystic duct and cystic artery were doubly clipped and divided close to the gallbladder.    The gallbladder was then dissected from its peritoneal and liver bed attachments by electrocautery. Hemostasis was checked prior to removing the hook cautery.  The Mechele Claude was undocked and moved out of the field.  A 90mm Endo Catch bag was then placed through the umbilical port and the gallbladder was removed.  The gallbladder was passed off the table as a specimen. There was no evidence of bleeding from the gallbladder fossa or cystic artery  or leakage of the bile from the cystic duct stump. The abdomen was desufflated and secondary trocars were removed under direct vision. The umbilical port site closed with a  0 vicryl.  No bleeding was noted. All skin incisions were closed with subcuticular sutures of 4-0 monocryl and dermabond.   Final inspection revealed acceptable hemostasis. All counts were correct at the end of the case. The patient was awakened from anesthesia and extubated without complication. The OG tube was removed.  The patient went to the PACU in stable condition.   Graciella Freer, DO [facility] [address] [place], [place] [address] [phone] (office)

## 2023-02-25 NOTE — Interval H&P Note (Signed)
History and Physical Interval Note:  02/25/2023 7:25 AM  Makayla Owens  has presented today for surgery, with the diagnosis of CHOLELITHIASIS.  The various methods of treatment have been discussed with the patient and family. After consideration of risks, benefits and other options for treatment, the patient has consented to  Procedure(s): XI ROBOTIC [facility] (N/A) as a surgical intervention.  The patient's history has been reviewed, patient examined, no change in status, stable for surgery.  I have reviewed the patient's chart and labs.  Questions were answered to the patient's satisfaction.     [name]

## 2023-02-25 NOTE — Anesthesia Preprocedure Evaluation (Addendum)
Anesthesia Evaluation  Patient identified by MRN, date of birth, ID band Patient awake    Reviewed: Allergy & Precautions, H&P , NPO status , Patient's Chart, lab work & pertinent test results  Airway Mallampati: III  TM Distance: >3 FB Neck ROM: Full    Dental  (+) Dental Advisory Given, Poor Dentition, Chipped   Pulmonary Current Smoker and Patient abstained from smoking.   Pulmonary exam normal breath sounds clear to auscultation       Cardiovascular Exercise Tolerance: Poor METS (bilateral lower lower extremity pain, difficult ot assess, can't climb stairs, can walk on flat surface lk): Normal cardiovascular exam Rhythm:Regular Rate:Normal  21-Feb-2023 13:00:59 [facility]-OPS ROUTINE RECORD Aug 13, 1961 (24 yr) Female Caucasian Vent. rate 93 BPM PR interval 142 ms QRS duration 146 ms QT/QTcB 416/517 ms P-R-T axes 76 -72 94 Normal sinus rhythm Left axis deviation Left bundle branch block Abnormal ECG When compared with ECG of 25-Nov-2015 07:09, PREVIOUS ECG IS PRESENT SINCE LAST TRACING HEART RATE HAS INCREASED Left bundle branch block is new. Confirmed by Charolette Forward [phone]) on 02/21/2023 2:42:00 PM Referred by: MARSHALL CHAMBLISS Confirmed By: Charolette Forward   Neuro/Psych  Headaches PSYCHIATRIC DISORDERS  Depression     Neuromuscular disease (diabetic neuropathy)    GI/Hepatic negative GI ROS, Neg liver ROS,,,  Endo/Other  diabetes, Well Controlled, Type 2, Oral Hypoglycemic Agents    Renal/GU Renal InsufficiencyRenal disease  negative genitourinary   Musculoskeletal  (+)  Fibromyalgia -  Abdominal   Peds negative pediatric ROS (+)  Hematology negative hematology ROS (+)   Anesthesia Other Findings Back and neck pain  Reproductive/Obstetrics negative OB ROS                             Anesthesia Physical Anesthesia Plan  ASA: 3  Anesthesia Plan: General    Post-op Pain Management: Dilaudid IV   Induction: Intravenous  PONV Risk Score and Plan: 4 or greater and Ondansetron and Dexamethasone  Airway Management Planned: Oral ETT  Additional Equipment:   Intra-op Plan:   Post-operative Plan: Extubation in OR  Informed Consent: I have reviewed the patients History and Physical, chart, labs and discussed the procedure including the risks, benefits and alternatives for the proposed anesthesia with the patient or authorized representative who has indicated his/her understanding and acceptance.     Dental advisory given  Plan Discussed with: CRNA and Surgeon  Anesthesia Plan Comments:         Anesthesia Quick Evaluation

## 2023-02-25 NOTE — Discharge Instructions (Signed)
Ambulatory Surgery Discharge Instructions  General Anesthesia or Sedation Do not drive or operate heavy machinery for 24 hours.  Do not consume alcohol, tranquilizers, sleeping medications, or any non-prescribed medications for 24 hours. Do not make important decisions or sign any important papers in the next 24 hours. You should have someone with you tonight at home.  Activity  You are advised to go directly home from the hospital.  Restrict your activities and rest for a day.  Resume light activity tomorrow. No heavy lifting over 10 lbs or strenuous exercise.  Fluids and Diet Begin with clear liquids, bouillon, dry toast, soda crackers.  If not nauseated, you may go to a regular diet when you desire.  Greasy and spicy foods are not advised.  Medications  If you have not had a bowel movement in 24 hours, take 2 tablespoons over the counter Milk of mag.             You May resume your blood thinners tomorrow (Aspirin, coumadin, or other).  You are being discharged with prescriptions for Opioid/Narcotic Medications: There are some specific considerations for these medications that you should know. Opioid Meds have risks & benefits. Addiction to these meds is always a concern with prolonged use Take medication only as directed Do not drive while taking narcotic pain medication Do not crush tablets or capsules Do not use a different container than medication was dispensed in Lock the container of medication in a cool, dry place out of reach of children and pets. Opioid medication can cause addiction Do not share with anyone else (this is a felony) Do not store medications for future use. Dispose of them properly.     Disposal:  Find a  household drug take back site near you.  If you can't get to a drug take back site, use the recipe below as a last resort to dispose of expired, unused or unwanted drugs. Disposal  (Do not dispose chemotherapy drugs this way, talk to your  prescribing doctor instead.) Step 1: Mix drugs (do not crush) with dirt, kitty litter, or used coffee grounds and add a small amount of water to dissolve any solid medications. Step 2: Seal drugs in plastic bag. Step 3: Place plastic bag in trash. Step 4: Take prescription container and scratch out personal information, then recycle or throw away.  Operative Site  You have a liquid bandage over your incisions, this will begin to flake off in about a week. Ok to shower tomorrow. Keep wound clean and dry. No baths or swimming. No lifting more than 10 pounds.  Contact Information: If you have questions or concerns, please call our office, [phone], Monday- Thursday 8AM-5PM and Friday 8AM-12Noon.  If it is after hours or on the weekend, please call [facility], [phone], and ask to speak to the surgeon on call for Dr. Alem Fahl at [place].   SPECIFIC COMPLICATIONS TO WATCH FOR: Inability to urinate Fever over 101? F by mouth Nausea and vomiting lasting longer than 24 hours. Pain not relieved by medication ordered Swelling around the operative site Increased redness, warmth, hardness, around operative area Numbness, tingling, or cold fingers or toes Blood -soaked dressing, (small amounts of oozing may be normal) Increasing and progressive drainage from surgical area or exam site  

## 2023-02-25 NOTE — Progress Notes (Signed)
[facility]  Spoke with the patient's husband in the consultation room.  I explained that she tolerated the procedure without difficulty.  She has dissolvable stitches under the skin with overlying skin glue.  This will flake off in 10 to 14 days.  I discharged her home with a prescription for narcotic pain medication that they should take as needed for pain.  I also want her taking scheduled Tylenol.  If they take the narcotic pain medication, they should take a stool softener as well.  The patient will follow-up with me in 2 weeks for phone follow-up.  All questions were answered to his expressed satisfaction.  Makayla Guarino, DO [facility] [address] [place], [place] [address] [phone] (office)  

## 2023-02-25 NOTE — Transfer of Care (Signed)
Immediate Anesthesia Transfer of Care Note  Patient: Makayla Owens  Procedure(s) Performed: XI ROBOTIC ASSISTED LAPAROSCOPIC CHOLECYSTECTOMY (Abdomen)  Patient Location: PACU  Anesthesia Type:General  Level of Consciousness: awake, alert , and oriented  Airway & Oxygen Therapy: Patient Spontanous Breathing and Patient connected to nasal cannula oxygen  Post-op Assessment: Report given to RN and Post -op Vital signs reviewed and stable  Post vital signs: Reviewed and stable  Last Vitals:  Vitals Value Taken Time  BP 101/57 02/25/23 0920  Temp 98   Pulse 78 02/25/23 0922  Resp 12 02/25/23 0922  SpO2 100 % 02/25/23 0922  Vitals shown include unvalidated device data.  Last Pain:  Vitals:   02/25/23 0642  PainSc: 0-No pain      Patients Stated Pain Goal: 5 ([id number])  Complications: No notable events documented.

## 2023-02-25 NOTE — Anesthesia Postprocedure Evaluation (Signed)
Anesthesia Post Note  Patient: Makayla Owens  Procedure(s) Performed: XI ROBOTIC ASSISTED LAPAROSCOPIC CHOLECYSTECTOMY (Abdomen)  Patient location during evaluation: Phase II Anesthesia Type: General Level of consciousness: awake and alert and oriented Pain management: pain level controlled Vital Signs Assessment: post-procedure vital signs reviewed and stable Respiratory status: spontaneous breathing, nonlabored ventilation and respiratory function stable Cardiovascular status: blood pressure returned to baseline and stable Postop Assessment: no apparent nausea or vomiting Anesthetic complications: no  No notable events documented.   Last Vitals:  Vitals:   02/25/23 1030 02/25/23 1040  BP: 104/65 111/70  Pulse: 84 85  Resp: 13 16  Temp:  (!) 36.4 C  SpO2: 91% 93%    Last Pain:  Vitals:   02/25/23 1040  TempSrc: Oral  PainSc: 0-No pain                 Darien Kading C Wright Gravely

## 2023-02-25 NOTE — Anesthesia Procedure Notes (Signed)
Procedure Name: Intubation Date/Time: 02/25/2023 7:45 AM  Performed by: Minerva Ends, CRNAPre-anesthesia Checklist: Patient identified, Emergency Drugs available, Suction available and Patient being monitored Patient Re-evaluated:Patient Re-evaluated prior to induction Oxygen Delivery Method: Circle system utilized Preoxygenation: Pre-oxygenation with 100% oxygen Induction Type: IV induction Ventilation: Mask ventilation without difficulty Laryngoscope Size: Mac and 3 Grade View: Grade I Tube type: Oral Tube size: 7.0 mm Number of attempts: 1 Airway Equipment and Method: Stylet and Oral airway Placement Confirmation: ETT inserted through vocal cords under direct vision, positive ETCO2 and breath sounds checked- equal and bilateral Secured at: 22 cm Tube secured with: Tape Dental Injury: Teeth and Oropharynx as per pre-operative assessment

## 2023-02-26 LAB — SURGICAL PATHOLOGY

## 2023-03-12 ENCOUNTER — Ambulatory Visit (INDEPENDENT_AMBULATORY_CARE_PROVIDER_SITE_OTHER): Payer: Medicaid Other | Admitting: Surgery

## 2023-03-12 DIAGNOSIS — Z09 Encounter for follow-up examination after completed treatment for conditions other than malignant neoplasm: Secondary | ICD-10-CM

## 2023-03-12 NOTE — Progress Notes (Signed)
[facility]  I am calling the patient for post operative evaluation. This is not a billable encounter as it is under the global charges for the surgery.  The patient had a robotic assisted laparoscopic cholecystectomy on 3/25. The patient reports that she is doing well. She is tolerating a diet, having good pain control, and having regular Bms.  The incisions are healing well. The patient has no concerns.   Pathology: A. GALLBLADDER, CHOLECYSTECTOMY:       Benign gallbladder with mural atrophy.       Cholelithiasis.   Will see the patient PRN.   Theophilus Kinds, DO [facility] [address] [place], [place] [address] [phone] (office)

## 2023-04-08 NOTE — Progress Notes (Unsigned)
    SUBJECTIVE:   CHIEF COMPLAINT / HPI:   ***  PERTINENT  PMH / PSH: *** Patient Active Problem List   Diagnosis Date Noted   Colon cancer screening 01/14/2023   Type 2 diabetes mellitus with diabetic neuropathy, unspecified (HCC) 07/09/2022   Lumbar back pain with radiculopathy affecting right lower extremity 07/09/2022   CKD stage 1 due to type 2 diabetes mellitus (HCC) 09/28/2015   Gallstone pancreatitis 09/16/2014   Right carpal tunnel syndrome 07/30/2014   Fibromyalgia syndrome 07/18/2012   NECK PAIN 05/19/2010   Calculus of gallbladder 05/05/2010   Stopped smoking 09/10/2009   DM (diabetes mellitus), type 2 with renal complications (HCC) 04/27/2009   HYPERCHOLESTEROLEMIA 04/27/2009   Depression 04/27/2009   Breast cancer in female [facility]) 04/27/2009    Current Outpatient Medications  Medication Instructions   citalopram (CELEXA) 40 mg, Oral, Daily   docusate sodium (COLACE) 100 mg, Oral, 2 times daily   empagliflozin (JARDIANCE) 10 mg, Oral, Daily   gabapentin (NEURONTIN) 300 mg, Oral, 3 times daily   ibuprofen (ADVIL) 400 mg, Oral, Every 6 hours PRN   metFORMIN (GLUCOPHAGE) 1,000 mg, Oral, 2 times daily with meals   Multiple Vitamin (MULTIVITAMIN WITH MINERALS) TABS tablet 1 tablet, Oral, Daily   oxyCODONE (ROXICODONE) 5 mg, Oral, Every 6 hours PRN   promethazine (PHENERGAN) 12.5 mg, Oral, Every 6 hours PRN   rosuvastatin (CRESTOR) 20 mg, Oral, Daily   triamcinolone cream (KENALOG) 0.1 % 1 application , Topical, 2 times daily       02/25/2023   10:40 AM 02/25/2023   10:30 AM 02/25/2023   10:26 AM  Vitals with BMI  Systolic 111 104   Diastolic 70 65   Pulse 85 84 88      OBJECTIVE:   There were no vitals taken for this visit.  ***  ASSESSMENT/PLAN:   There are no diagnoses linked to this encounter.   There are no Patient Instructions on file for this visit.   Carney Living, MD [facility]

## 2023-04-08 NOTE — Patient Instructions (Signed)
Good to see you today - Thank you for coming in  Things we discussed today:  You need a colonoscopy to prevent colon cancer.  I have placed a referral to the gastroenterologist's office.  They should call you within two weeks.  If they do not please let me know   You need a Pap smear to prevent cervical cancer.  Please make an appointment.   You need an diabetes eye exam every year.  Please see your eye doctor.  Ask them to fax [place] a report of your exam   Please always bring your medication bottles  Come back to see me in ***

## 2023-04-09 ENCOUNTER — Ambulatory Visit (INDEPENDENT_AMBULATORY_CARE_PROVIDER_SITE_OTHER): Payer: Medicaid Other | Admitting: Family Medicine

## 2023-04-09 ENCOUNTER — Encounter: Payer: Self-pay | Admitting: Family Medicine

## 2023-04-09 VITALS — BP 116/65 | HR 94 | Ht 66.0 in | Wt 135.0 lb

## 2023-04-09 DIAGNOSIS — N181 Chronic kidney disease, stage 1: Secondary | ICD-10-CM

## 2023-04-09 DIAGNOSIS — Z1211 Encounter for screening for malignant neoplasm of colon: Secondary | ICD-10-CM

## 2023-04-09 DIAGNOSIS — Z87891 Personal history of nicotine dependence: Secondary | ICD-10-CM | POA: Diagnosis not present

## 2023-04-09 DIAGNOSIS — E7800 Pure hypercholesterolemia, unspecified: Secondary | ICD-10-CM | POA: Diagnosis not present

## 2023-04-09 DIAGNOSIS — C50912 Malignant neoplasm of unspecified site of left female breast: Secondary | ICD-10-CM | POA: Diagnosis not present

## 2023-04-09 DIAGNOSIS — N63 Unspecified lump in unspecified breast: Secondary | ICD-10-CM | POA: Insufficient documentation

## 2023-04-09 DIAGNOSIS — Z8679 Personal history of other diseases of the circulatory system: Secondary | ICD-10-CM | POA: Diagnosis not present

## 2023-04-09 DIAGNOSIS — E1122 Type 2 diabetes mellitus with diabetic chronic kidney disease: Secondary | ICD-10-CM | POA: Diagnosis not present

## 2023-04-09 LAB — POCT GLYCOSYLATED HEMOGLOBIN (HGB A1C): HbA1c, POC (controlled diabetic range): 6.7 % (ref 0.0–7.0)

## 2023-04-09 NOTE — Progress Notes (Signed)
  Date of Visit: 04/09/2023   SUBJECTIVE:   HPI:  Makayla Owens who has a PMHx of DM, CKD, HLD, GERD, left breast Cancer s/p bil mastectomy w/ reconstruction, and cholecystectomy on 02/25/23 presents today for check up. The patient experienced some post op nausea which resolved within the week and abdominal pain which resolved within a few days. She did a tele-visit with her surgery center went well and she is happy with her wound healing. Her anesthesiologist advised to have her PCP look at the EKG done at [facility] done around the time of her operation on 02/25/23.  Patient says she is supposed to have yearly lymph node check since her breast cancer in 2008. She says that she feels a small mass on the left side near where her original cancer was found, which is along the breast implant that she has. The patient has lost about 6 lbs, which she attributes to her diet change, surgery, and Jardiance.  Diabetes: The patient stopped her Jardiance 1 week before surgery at the physician's recommendation. When restarted after she developed heartburn and stopped again. Upon restarting a second time she developed heartburn again and ceased the medication.   OBJECTIVE:   BP 116/65   Pulse 94   Ht 5\' 6"  (1.676 m)   Wt 135 lb (61.2 kg)   SpO2 99%   BMI 21.79 kg/m  Gen: Well appearing, normal build Heart: Normal rate and rhythm Chest: Examination performed by Dr. Deirdre Priest. Rubbery feeling projection under skin approx 2 oclock on L breast.  implant, no axillary lymphadenopathy either side  Lungs: Lungs clear to auscultation bilaterally Neuro/Ext: Alert and conversational, normal gait.  ASSESSMENT/PLAN:   Health maintenance:  -Shingrix: Declined at this time, does not want the side effects after her recent illness. -Pap-Smear: declined for today, but is willing to do this on next visit.  DM (diabetes mellitus), type 2 with renal complications (HCC) A1C improved to 6.7. Encouraged to restart Jardiance  in a couple of months to see if reflux symptoms return. Last eye check 2013, plans to arrange appointment in [place] within this fiscal year. Declined referral.  HYPERCHOLESTEROLEMIA Rosuvastatin started 01/14/2023. Plan to recheck lipids on next blood draw.  Stopped smoking Still smoking 0.5 ppd. She is not wanting to attempt to reduce any further at this time.  Colon cancer screening Has referral, plans to set appointment date within this fiscal year.  Breast mass in female S/p chemo and bilateral mastectomy 2008/9 for left breast cancer BRCA negative. Small fluctuant bump felt along the left breast implant, no axillary lymphadenopathy, referring to breast surgeon for evaluation.  CKD stage 1 due to type 2 diabetes mellitus (HCC) Encouraged to retry Jardiance in a couple of months. Blood pressures normal at home. February '24 bloodwork normal.  FOLLOW UP: Follow up in 2 months with Dr. Faythe Dingwall [facility]  I was present during key history taking and physical exam and any procedures.  I agree with the note above Pauline Good MD

## 2023-04-09 NOTE — Assessment & Plan Note (Signed)
Still smoking 0.5 ppd. She is not wanting to attempt to reduce any further at this time.

## 2023-04-09 NOTE — Assessment & Plan Note (Signed)
Has referral, plans to set appointment date within this fiscal year.

## 2023-04-09 NOTE — Assessment & Plan Note (Addendum)
S/p chemo and bilateral mastectomy 2008/9 for left breast cancer BRCA negative. Rubbery projection felt along the left breast implant, no axillary lymphadenopathy.  Presume this mass is related to her implant and not cancer recurrence but my exam is limited. Will referr to breast surgeon for evaluation and possible management of implant degrading.

## 2023-04-09 NOTE — Assessment & Plan Note (Deleted)
Small fluctuant bump felt along the left breast implant, no axillary lymphadenopathy, referring to breast surgeon for evaluation.

## 2023-04-09 NOTE — Assessment & Plan Note (Signed)
Encouraged to retry Jardiance in a couple of months. Blood pressures normal at home. February '24 bloodwork normal.

## 2023-04-09 NOTE — Assessment & Plan Note (Signed)
A1C improved to 6.7. Encouraged to restart Jardiance in a couple of months to see if reflux symptoms return. Last eye check 2013, plans to arrange appointment in [place] within this fiscal year. Declined referral.

## 2023-04-09 NOTE — Assessment & Plan Note (Signed)
Rosuvastatin started 01/14/2023. Plan to recheck lipids on next blood draw.

## 2023-04-09 NOTE — Assessment & Plan Note (Signed)
>>  ASSESSMENT AND PLAN FOR DM (DIABETES MELLITUS), TYPE 2 WITH RENAL COMPLICATIONS (HCC) WRITTEN ON 04/09/2023  9:55 AM BY GARR, MATTHEW K, MEDICAL STUDENT  A1C improved to 6.7. Encouraged to restart Jardiance in a couple of months to see if reflux symptoms return. Last eye check 2013, plans to arrange appointment in [place] within this fiscal year. Declined referral.

## 2023-04-17 ENCOUNTER — Telehealth: Payer: Self-pay

## 2023-04-17 DIAGNOSIS — N63 Unspecified lump in unspecified breast: Secondary | ICD-10-CM

## 2023-04-17 NOTE — Telephone Encounter (Signed)
Unsure what happened with the original order but referral was faxed today to [facility].  They will review and call patient about an appt.  [facility] (Breast Surgery) [address], [address] [place] [address] [phone]

## 2023-04-17 NOTE — Telephone Encounter (Signed)
Patient calls nurse line checking the status of breast referral.  Patient is anxious as she has a hx of breast cancer.   Will forward to referral coordinator for update.

## 2023-04-17 NOTE — Addendum Note (Signed)
Addended by: Pearlean Brownie L on: 04/17/2023 11:48 AM   Modules accepted: Orders

## 2023-04-17 NOTE — Addendum Note (Signed)
Addended by: Pearlean Brownie L on: 04/17/2023 11:47 AM   Modules accepted: Orders

## 2023-04-17 NOTE — Addendum Note (Signed)
Addended by: Henri Medal on: 04/17/2023 12:01 PM   Modules accepted: Orders

## 2023-04-19 NOTE — Telephone Encounter (Signed)
Spoke with patient and she states that she spoke with the surgeon and she is scheduled for Tuesday May 22. Bailey Kolbe,CMA

## 2023-04-23 ENCOUNTER — Other Ambulatory Visit: Payer: Self-pay | Admitting: *Deleted

## 2023-04-23 DIAGNOSIS — N181 Chronic kidney disease, stage 1: Secondary | ICD-10-CM

## 2023-04-23 MED ORDER — METFORMIN HCL 1000 MG PO TABS: 1000.0000 mg | ORAL_TABLET | Freq: Two times a day (BID) | ORAL | 3 refills | Status: DC

## 2023-04-24 DIAGNOSIS — N6320 Unspecified lump in the left breast, unspecified quadrant: Secondary | ICD-10-CM | POA: Diagnosis not present

## 2023-04-24 DIAGNOSIS — Z9013 Acquired absence of bilateral breasts and nipples: Secondary | ICD-10-CM | POA: Diagnosis not present

## 2023-04-24 DIAGNOSIS — Z853 Personal history of malignant neoplasm of breast: Secondary | ICD-10-CM | POA: Diagnosis not present

## 2023-05-28 ENCOUNTER — Other Ambulatory Visit: Payer: Self-pay

## 2023-05-28 ENCOUNTER — Encounter: Payer: Self-pay | Admitting: Family Medicine

## 2023-05-28 ENCOUNTER — Ambulatory Visit (INDEPENDENT_AMBULATORY_CARE_PROVIDER_SITE_OTHER): Payer: Medicaid Other | Admitting: Family Medicine

## 2023-05-28 VITALS — BP 123/78 | HR 86 | Wt 129.2 lb

## 2023-05-28 DIAGNOSIS — F32A Depression, unspecified: Secondary | ICD-10-CM

## 2023-05-28 DIAGNOSIS — N63 Unspecified lump in unspecified breast: Secondary | ICD-10-CM

## 2023-05-28 DIAGNOSIS — E1140 Type 2 diabetes mellitus with diabetic neuropathy, unspecified: Secondary | ICD-10-CM | POA: Diagnosis not present

## 2023-05-28 NOTE — Assessment & Plan Note (Signed)
Discussed she should follow up with Breast surgery as she is already established and that they would likely be able to suggest further imaging as her insurance covers.

## 2023-05-28 NOTE — Assessment & Plan Note (Signed)
I think she is having more of a flare of anxiety due to her breast mass and cancer fear.  Discussed ways to cope.  She is not interested in counseling at this time.  Continue celexa 40 mg daily

## 2023-05-28 NOTE — Progress Notes (Signed)
    SUBJECTIVE:   CHIEF COMPLAINT / HPI:   Breast Mass Seen by breast surgery 5/22.  See their note for details.  They feel her mass is likely displacement of her implant. They ordered an MRI but this is held up due to authorization.    She has worsening pain in her breasts although no redness or discharge or fever or skin signs. She and her mother believe anxiety could be making this worse as she is concerned about recurrence of cancer She is taking ibuprofen which helps  Anxiety  She is very worried about cancer and that her work up is being delayed.  Trouble sleeping and concentrating.  Talking with her mother helps.  She is taking celexa daily    OBJECTIVE:   BP 123/78   Pulse 86   Wt 129 lb 3.2 oz (58.6 kg)   SpO2 98%   BMI 20.85 kg/m   No distress Mildly pressured speech.  Other wise normal exam  ASSESSMENT/PLAN:   Type 2 diabetes mellitus with diabetic neuropathy, unspecified whether long term insulin use (HCC)  Breast mass in female Assessment & Plan: Discussed she should follow up with Breast surgery as she is already established and that they would likely be able to suggest further imaging as her insurance covers.   Depression, unspecified depression type Assessment & Plan: I think she is having more of a flare of anxiety due to her breast mass and cancer fear.  Discussed ways to cope.  She is not interested in counseling at this time.  Continue celexa 40 mg daily         Patient Instructions  Good to see you today - Thank you for coming in  Things we discussed today:  Stress - talk with mom - Read - exercise prayer and meditation time for you  Pain Take the ibuprofen with food - Mix in tylenol take up 1000 mg three times a day  Follow up with your breast doctor  Continue to eat healthy  Please always bring your medication bottles  Follow up with Dr Linwood Dibbles in 1-2 months    Carney Living, MD [facility]

## 2023-05-28 NOTE — Patient Instructions (Signed)
Good to see you today - Thank you for coming in  Things we discussed today:  Stress - talk with mom - Read - exercise prayer and meditation time for you  Pain Take the ibuprofen with food - Mix in tylenol take up 1000 mg three times a day  Follow up with your breast doctor  Continue to eat healthy  Please always bring your medication bottles  Follow up with Dr Linwood Dibbles in 1-2 months

## 2023-06-24 ENCOUNTER — Other Ambulatory Visit: Payer: Self-pay

## 2023-06-24 DIAGNOSIS — F32A Depression, unspecified: Secondary | ICD-10-CM

## 2023-06-24 MED ORDER — CITALOPRAM HYDROBROMIDE 40 MG PO TABS: 40.0000 mg | ORAL_TABLET | Freq: Every day | ORAL | 3 refills | Status: DC

## 2023-07-06 ENCOUNTER — Other Ambulatory Visit: Payer: Self-pay

## 2023-07-06 ENCOUNTER — Emergency Department (HOSPITAL_COMMUNITY)
Admission: EM | Admit: 2023-07-06 | Discharge: 2023-07-06 | Disposition: A | Discharge status: Home / Self Care | Payer: Medicaid Other | Source: Home / Self Care | Attending: Emergency Medicine | Admitting: Emergency Medicine

## 2023-07-06 ENCOUNTER — Emergency Department (HOSPITAL_COMMUNITY): Payer: Medicaid Other

## 2023-07-06 ENCOUNTER — Encounter (HOSPITAL_COMMUNITY): Payer: Self-pay | Admitting: Pharmacy Technician

## 2023-07-06 DIAGNOSIS — M545 Low back pain, unspecified: Secondary | ICD-10-CM | POA: Diagnosis present

## 2023-07-06 DIAGNOSIS — Z7984 Long term (current) use of oral hypoglycemic drugs: Secondary | ICD-10-CM | POA: Diagnosis not present

## 2023-07-06 DIAGNOSIS — N189 Chronic kidney disease, unspecified: Secondary | ICD-10-CM | POA: Diagnosis not present

## 2023-07-06 DIAGNOSIS — M5442 Lumbago with sciatica, left side: Secondary | ICD-10-CM | POA: Insufficient documentation

## 2023-07-06 DIAGNOSIS — M25552 Pain in left hip: Secondary | ICD-10-CM | POA: Diagnosis not present

## 2023-07-06 DIAGNOSIS — I700 Atherosclerosis of aorta: Secondary | ICD-10-CM | POA: Diagnosis not present

## 2023-07-06 DIAGNOSIS — Z853 Personal history of malignant neoplasm of breast: Secondary | ICD-10-CM | POA: Insufficient documentation

## 2023-07-06 DIAGNOSIS — M5136 Other intervertebral disc degeneration, lumbar region: Secondary | ICD-10-CM | POA: Diagnosis not present

## 2023-07-06 DIAGNOSIS — F17200 Nicotine dependence, unspecified, uncomplicated: Secondary | ICD-10-CM | POA: Insufficient documentation

## 2023-07-06 DIAGNOSIS — M160 Bilateral primary osteoarthritis of hip: Secondary | ICD-10-CM | POA: Diagnosis not present

## 2023-07-06 DIAGNOSIS — M549 Dorsalgia, unspecified: Secondary | ICD-10-CM | POA: Diagnosis not present

## 2023-07-06 DIAGNOSIS — E1122 Type 2 diabetes mellitus with diabetic chronic kidney disease: Secondary | ICD-10-CM | POA: Diagnosis not present

## 2023-07-06 MED ORDER — PREDNISONE 10 MG PO TABS: 40.0000 mg | ORAL_TABLET | Freq: Every day | ORAL | 0 refills | Status: DC

## 2023-07-06 MED ORDER — PREDNISONE 50 MG PO TABS
60.0000 mg | ORAL_TABLET | Freq: Once | ORAL | Status: AC
  Administered 2023-07-06: 60 mg via ORAL
  Filled 2023-07-06: qty 1

## 2023-07-06 NOTE — ED Triage Notes (Signed)
Pt here with reports of back pain sudden onset on Wednesday. Denies falls or injury. Pain is constant. Pain is central and L sided back/hip pain. Denies dysuria, denies incontinence. Pt taking Ibuprofen with minimal relief.

## 2023-07-06 NOTE — Discharge Instructions (Addendum)
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  Based on this she may benefit from a course of prednisone.  Take the prednisone as directed.  First dose given here you can start your prescription doses tomorrow.  Also take extra strength Tylenol 2 tablets every 8 hours.  While taking the prednisone you will not be able to take the Motrin.  Would expect improvement over the next few days.

## 2023-07-06 NOTE — ED Notes (Signed)
Patient Alert and oriented to baseline. Stable and ambulatory to baseline. Patient verbalized understanding of the discharge instructions.  Patient belongings were taken by the patient.   

## 2023-07-06 NOTE — ED Notes (Signed)
Patient transported to X-ray 

## 2023-07-06 NOTE — ED Provider Notes (Addendum)
[place] EMERGENCY DEPARTMENT AT [facility] Provider Note   CSN: [id number] Arrival date & time: 07/06/23  0732     History  Chief Complaint  Patient presents with   Back Pain    Makayla Owens is a 62 y.o. female.  Patient with plaint of left hip pain that radiates down her leg all the way to the foot but no numbness or weakness in the foot.  This has been ongoing since Wednesday.  No fall or injury.  Patient has had pain in similar location in the right hip.  Patient taking ibuprofen with minimal relief.  Patient not able to take narcotics on a regular basis.  But she did have one of her oxycodone leftover from her gallbladder surgery and did take one of them but it did not really help.  Past medical history significant for diabetes breast cancer with silicone implants depression migraines gallbladder removal chronic kidney disease bilateral mastectomies patient is an everyday smoker.  Patient has been walking with a cane.       Home Medications Prior to Admission medications   Medication Sig Start Date End Date Taking? Authorizing Provider  citalopram (CELEXA) 40 MG tablet Take 1 tablet (40 mg total) by mouth daily. 06/24/23   Caro Laroche, DO  docusate sodium (COLACE) 100 MG capsule Take 1 capsule (100 mg total) by mouth 2 (two) times daily. 02/25/23 02/25/24  Pappayliou, Santina Evans A, DO  empagliflozin (JARDIANCE) 10 MG TABS tablet Take 1 tablet (10 mg total) by mouth daily. 01/14/23   Moses Manners, MD  ibuprofen (ADVIL) 200 MG tablet Take 400 mg by mouth every 6 (six) hours as needed for moderate pain.    [provider]  metFORMIN (GLUCOPHAGE) 1000 MG tablet Take 1 tablet (1,000 mg total) by mouth 2 (two) times daily with a meal. 04/23/23   Chambliss, Estill Batten, MD  Multiple Vitamin (MULTIVITAMIN WITH MINERALS) TABS tablet Take 1 tablet by mouth daily.    [provider]  rosuvastatin (CRESTOR) 20 MG tablet Take 1 tablet (20 mg total) by mouth  daily. 01/15/23   Moses Manners, MD  triamcinolone cream (KENALOG) 0.1 % Apply 1 application topically 2 (two) times daily. Patient not taking: Reported on 02/19/2023 06/12/20   Jacalyn Lefevre, MD      Allergies    Nitrofurantoin, Elavil [amitriptyline], Lipitor [atorvastatin], Morphine and codeine, Other, Statins, and Zofran [ondansetron hcl]    Review of Systems   Review of Systems  Constitutional:  Negative for chills and fever.  HENT:  Negative for ear pain and sore throat.   Eyes:  Negative for pain and visual disturbance.  Respiratory:  Negative for cough and shortness of breath.   Cardiovascular:  Negative for chest pain and palpitations.  Gastrointestinal:  Negative for abdominal pain and vomiting.  Genitourinary:  Negative for dysuria and hematuria.  Musculoskeletal:  Positive for back pain. Negative for arthralgias.  Skin:  Negative for color change and rash.  Neurological:  Negative for seizures and syncope.  All other systems reviewed and are negative.   Physical Exam Updated Vital Signs BP 108/70   Pulse 90   Temp (!) 97.5 F (36.4 C) (Oral)   Resp 18   SpO2 99%  Physical Exam Vitals and nursing note reviewed.  Constitutional:      General: She is not in acute distress.    Appearance: Normal appearance. She is well-developed.  HENT:     Head: Normocephalic and atraumatic.  Eyes:     Conjunctiva/sclera: Conjunctivae normal.  Cardiovascular:     Rate and Rhythm: Normal rate and regular rhythm.     Heart sounds: No murmur heard. Pulmonary:     Effort: Pulmonary effort is normal. No respiratory distress.     Breath sounds: Normal breath sounds.  Abdominal:     Palpations: Abdomen is soft.     Tenderness: There is no abdominal tenderness.  Musculoskeletal:        General: No swelling, tenderness or deformity.     Cervical back: Neck supple.     Right lower leg: No edema.     Left lower leg: No edema.  Skin:    General: Skin is warm and dry.      Capillary Refill: Capillary refill takes less than 2 seconds.  Neurological:     General: No focal deficit present.     Mental Status: She is alert and oriented to person, place, and time.     Cranial Nerves: No cranial nerve deficit.     Sensory: No sensory deficit.     Motor: No weakness.  Psychiatric:        Mood and Affect: Mood normal.     ED Results / Procedures / Treatments   Labs (all labs ordered are listed, but only abnormal results are displayed) Labs Reviewed - No data to display  EKG None  Radiology No results found.  Procedures Procedures    Medications Ordered in ED Medications - No data to display  ED Course/ Medical Decision Making/ A&P                                 Medical Decision Making Amount and/or Complexity of Data Reviewed Radiology: ordered.   Will get x-ray of lumbar spine and x-ray of the left hip and pelvis.  Lumbar x-ray degeneration without acute findings.  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  Based on this patient may improve somewhat with a course of steroids.  Will treat her with prednisone here and then a course of prednisone.   Final Clinical Impression(s) / ED Diagnoses Final diagnoses:  Acute left-sided low back pain with left-sided sciatica  Left hip pain    Rx / DC Orders ED Discharge Orders     None         Vanetta Mulders, MD 07/06/23 0865    Vanetta Mulders, MD 07/06/23 [phone]

## 2023-07-09 ENCOUNTER — Encounter: Payer: Self-pay | Admitting: *Deleted

## 2023-08-07 DIAGNOSIS — N6012 Diffuse cystic mastopathy of left breast: Secondary | ICD-10-CM | POA: Diagnosis not present

## 2023-08-07 DIAGNOSIS — Z853 Personal history of malignant neoplasm of breast: Secondary | ICD-10-CM | POA: Diagnosis not present

## 2023-08-07 DIAGNOSIS — N6320 Unspecified lump in the left breast, unspecified quadrant: Secondary | ICD-10-CM | POA: Diagnosis not present

## 2023-08-07 DIAGNOSIS — N641 Fat necrosis of breast: Secondary | ICD-10-CM | POA: Diagnosis not present

## 2023-08-07 DIAGNOSIS — R921 Mammographic calcification found on diagnostic imaging of breast: Secondary | ICD-10-CM | POA: Diagnosis not present

## 2023-08-26 ENCOUNTER — Ambulatory Visit (INDEPENDENT_AMBULATORY_CARE_PROVIDER_SITE_OTHER): Payer: Medicaid Other | Admitting: Family Medicine

## 2023-08-26 ENCOUNTER — Encounter: Payer: Self-pay | Admitting: Family Medicine

## 2023-08-26 VITALS — BP 113/70 | HR 88 | Ht 66.0 in | Wt 131.6 lb

## 2023-08-26 DIAGNOSIS — R399 Unspecified symptoms and signs involving the genitourinary system: Secondary | ICD-10-CM | POA: Diagnosis not present

## 2023-08-26 DIAGNOSIS — E1122 Type 2 diabetes mellitus with diabetic chronic kidney disease: Secondary | ICD-10-CM | POA: Diagnosis not present

## 2023-08-26 DIAGNOSIS — E1140 Type 2 diabetes mellitus with diabetic neuropathy, unspecified: Secondary | ICD-10-CM | POA: Diagnosis not present

## 2023-08-26 DIAGNOSIS — N181 Chronic kidney disease, stage 1: Secondary | ICD-10-CM | POA: Diagnosis not present

## 2023-08-26 DIAGNOSIS — E7800 Pure hypercholesterolemia, unspecified: Secondary | ICD-10-CM

## 2023-08-26 DIAGNOSIS — C50912 Malignant neoplasm of unspecified site of left female breast: Secondary | ICD-10-CM

## 2023-08-26 DIAGNOSIS — F32A Depression, unspecified: Secondary | ICD-10-CM

## 2023-08-26 DIAGNOSIS — L989 Disorder of the skin and subcutaneous tissue, unspecified: Secondary | ICD-10-CM | POA: Insufficient documentation

## 2023-08-26 LAB — POCT GLYCOSYLATED HEMOGLOBIN (HGB A1C): HbA1c, POC (controlled diabetic range): 6.5 % (ref 0.0–7.0)

## 2023-08-26 LAB — POCT URINALYSIS DIP (MANUAL ENTRY)
Bilirubin, UA: NEGATIVE
Blood, UA: NEGATIVE
Glucose, UA: 500 mg/dL — AB
Ketones, POC UA: NEGATIVE mg/dL
Leukocytes, UA: NEGATIVE
Nitrite, UA: NEGATIVE
Protein Ur, POC: NEGATIVE mg/dL
Spec Grav, UA: <=1.005 — AB (ref 1.010–1.025)
Urobilinogen, UA: 0.2 E.U./dL
pH, UA: 5.5 (ref 5.0–8.0)

## 2023-08-26 MED ORDER — CEFADROXIL 500 MG PO CAPS: 500.0000 mg | ORAL_CAPSULE | Freq: Two times a day (BID) | ORAL | 0 refills | Status: AC

## 2023-08-26 NOTE — Patient Instructions (Signed)
It was great to see you!  Our plans for today:  - Take the antibiotic for your urinary symptoms. We will call you if we have to change the antibiotics based on your urine culture.  - We referred you to an eye doctor. Let us know if you don't hear about an appointment.   We are checking some labs today, we will release these results to your MyChart.  Take care and seek immediate care sooner if you develop any concerns.   Dr. Linwood Dibbles

## 2023-08-26 NOTE — Assessment & Plan Note (Signed)
Doing well on current regimen, no changes made today.

## 2023-08-26 NOTE — Assessment & Plan Note (Signed)
Obtain A1c and adjust regimen as indicated. Foot exam performed today. Referred for eye exam. On statin. Consider ACEI/ARB on followup if BP tolerates. Obtaining labs today.
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
## 2023-08-26 NOTE — Assessment & Plan Note (Signed)
Tolerant of statin, continue. Obtaining labs.

## 2023-08-26 NOTE — Assessment & Plan Note (Signed)
Will schedule in derm clinic for biopsy.

## 2023-08-27 ENCOUNTER — Encounter: Payer: Self-pay | Admitting: Family Medicine

## 2023-08-27 LAB — BASIC METABOLIC PANEL
BUN/Creatinine Ratio: 14 (ref 9–23)
BUN: 8 mg/dL (ref 6–24)
CO2: 22 mmol/L (ref 20–29)
Calcium: 9.7 mg/dL (ref 8.7–10.2)
Chloride: 97 mmol/L (ref 96–106)
Creatinine, Ser: 0.57 mg/dL (ref 0.57–1.00)
Glucose: 109 mg/dL — ABNORMAL HIGH (ref 70–99)
Potassium: 4.8 mmol/L (ref 3.5–5.2)
Sodium: 135 mmol/L (ref 134–144)
eGFR: 105 mL/min/{1.73_m2} (ref >59)

## 2023-08-27 LAB — LIPID PANEL
Chol/HDL Ratio: 2.2 ratio (ref 0.0–4.4)
Cholesterol, Total: 131 mg/dL (ref 100–199)
HDL: 60 mg/dL (ref >39)
LDL Chol Calc (NIH): 54 mg/dL (ref 0–99)
Triglycerides: 88 mg/dL (ref 0–149)
VLDL Cholesterol Cal: 17 mg/dL (ref 5–40)

## 2023-08-29 LAB — URINE CULTURE

## 2023-08-30 ENCOUNTER — Telehealth: Payer: Self-pay

## 2023-08-30 NOTE — Telephone Encounter (Signed)
Patient's mother calls nurse line regarding results of urine culture.   Please advise.   Veronda Prude, RN

## 2023-08-31 LAB — URINE CULTURE

## 2023-09-02 ENCOUNTER — Telehealth: Payer: Self-pay

## 2023-09-02 MED ORDER — FLUCONAZOLE 150 MG PO TABS: 150.0000 mg | ORAL_TABLET | Freq: Once | ORAL | 0 refills | Status: AC

## 2023-09-02 NOTE — Telephone Encounter (Signed)
Patient calls nurse line requesting Diflucan.   She reports she finished her antibiotic on Saturday. She reports she started having yeast symptoms on Friday. She reports discomfort and itching. She denies any odors.   She reports her UTI have subsided and she feels much better. She is requesting the results of the urine culture.   Discussed other lab work with patient.   Will forward to PCP.

## 2023-09-02 NOTE — Addendum Note (Signed)
Addended by: Caro Laroche on: 09/02/2023 09:00 AM   Modules accepted: Orders

## 2023-09-09 ENCOUNTER — Institutional Professional Consult (permissible substitution): Payer: Medicaid Other | Admitting: Plastic Surgery

## 2023-09-11 ENCOUNTER — Telehealth: Payer: Self-pay

## 2023-09-11 NOTE — Telephone Encounter (Signed)
Patient calls nurse line in regards to results letter she received.   She reports she would prefer to have facial biopsy done here at [facility] vs a referral elsewhere.   She reports she is not concerned with scaring at this time in her life.   Patient scheduled for [facility] on 10/24.

## 2023-09-26 ENCOUNTER — Ambulatory Visit: Payer: Medicaid Other | Admitting: Family Medicine

## 2023-09-26 VITALS — BP 102/70 | HR 96 | Ht 66.0 in | Wt 130.0 lb

## 2023-09-26 DIAGNOSIS — R21 Rash and other nonspecific skin eruption: Secondary | ICD-10-CM | POA: Diagnosis not present

## 2023-09-26 DIAGNOSIS — L729 Follicular cyst of the skin and subcutaneous tissue, unspecified: Secondary | ICD-10-CM | POA: Diagnosis not present

## 2023-09-26 DIAGNOSIS — L989 Disorder of the skin and subcutaneous tissue, unspecified: Secondary | ICD-10-CM | POA: Diagnosis not present

## 2023-09-26 DIAGNOSIS — L820 Inflamed seborrheic keratosis: Secondary | ICD-10-CM | POA: Diagnosis not present

## 2023-09-26 MED ORDER — TRIAMCINOLONE ACETONIDE 0.5 % EX OINT
1.0000 | TOPICAL_OINTMENT | Freq: Two times a day (BID) | CUTANEOUS | 0 refills | Status: DC
Start: 2023-09-26 — End: 2024-03-30

## 2023-09-26 NOTE — Patient Instructions (Signed)
It was great to see you! Thank you for allowing me to participate in your care!   We will notify you of the biopsy results when they return.  Wound Care: The wound should be kept clean and dry. Patients are often advised to change dressings daily or as needed if they become wet or soiled. Gentle cleaning with soap and water is usually sufficient.  Pain Management: Over-the-counter pain medications such as acetaminophen or ibuprofen are generally recommended for pain control. Stronger analgesics may be prescribed if necessary.  Signs of Infection: Patients should be educated on recognizing signs of infection, such as increased redness, swelling, warmth, pain, or discharge from the wound, and advised to seek medical attention if these occ  Take care and seek immediate care sooner if you develop any concerns. Please remember to show up 15 minutes before your scheduled appointment time!  Tiffany Kocher, DO [facility]

## 2023-09-26 NOTE — Progress Notes (Unsigned)
    SUBJECTIVE:   CHIEF COMPLAINT / HPI:   Lesion of Face Present for "long time."  Peeling.  Slow growth.  Not painful, not pruritic, not bleeding.  History of breast cancer, no history of skin cancer.  No family history of skin cancer.  Sun exposed area.  Cyst Rash Cyst developed over many weeks.  Past Thursday, started to get red and painful.  On Tuesday pain was so severe she decided to go to ED.  Prior to going to the ED she took a shower, the lesion then opened and started draining.  She has been using OTC Neosporin on the lesion.  Lesion drained thick white fluid, now draining yellow translucent fluid.  No longer painful.  She developed a circumferential urticarial rash, she thinks this is secondary to an allergic reaction from the bandage she used-this has happened before.   OBJECTIVE:   BP 102/70   Pulse 96   Ht 5\' 6"  (1.676 m)   Wt 130 lb (59 kg)   SpO2 96%   BMI 20.98 kg/m   General: NAD, well-appearing, well-nourished Respiratory: No respiratory distress, breathing comfortably, able to speak in full sentences Skin: warm and dry. ~ 3 mm raised, scaly, hyperpigmented, papule on right face. ~1 x 1 cm, open and draining translucent yellow fluid cyst on posterior left ribs, no erythema, no warmth. Circumferential urticarial rash around cyst. Psych: Appropriate affect and mood   ASSESSMENT/PLAN:   Assessment & Plan Face lesion Differential includes: Sun spot, actinic keratosis, nevus, malignancy. - Punch biopsy obtained see procedure note - Follow-up pathology Rash Rash 2/2 allergic reaction recommended. - Triamcinolone ointment Cutaneous cyst Noninfected, draining, not painful. - Supportive care measures discussed - Follow-up with PCP if not improving  Procedure Note: Punch Skin Biopsy Location: Right face Performing Physician: Tiffany Kocher, DO Supervising Physician (if applicable): Pearlean Brownie, MD  Informed consent was obtained prior to the procedure.  Time out performed. The area surrounding the skin lesion was prepared and cleaned with povidone-iodine swab stick and wiped clean with alcohol prep. The area was sufficiently anesthetized with 1% Lidocaine with epinephrine.  A size 2mm disposable biopsy punch tool was used to obtain tissue sample and placed in labeled biopsy cup. Drysol was used to obtain hemostasis. The site was not closed. The patient tolerated the procedure well without complications.  Standard post-procedure care was explained and return precautions and wound care handout given.  Tiffany Kocher, DO [facility]

## 2023-09-26 NOTE — Assessment & Plan Note (Signed)
Differential includes: Sun spot, actinic keratosis, nevus, malignancy. - Punch biopsy obtained see procedure note - Follow-up pathology

## 2023-09-30 LAB — DERMATOLOGY PATHOLOGY

## 2023-10-01 ENCOUNTER — Telehealth: Payer: Self-pay | Admitting: Student

## 2023-10-01 NOTE — Telephone Encounter (Signed)
Called patient, confirmed identity.  Discussed benign biopsy results.  Discussed treatment options including cryotherapy once biopsy site is healed.  Patient states she will call to schedule appointment if lesion becomes bothersome, but does not wish for intervention at this time.  Answered all questions, patient expressed understanding and agreement with plan.

## 2023-10-02 ENCOUNTER — Encounter: Payer: Self-pay | Admitting: Plastic Surgery

## 2023-10-02 ENCOUNTER — Ambulatory Visit (INDEPENDENT_AMBULATORY_CARE_PROVIDER_SITE_OTHER): Payer: Medicaid Other | Admitting: Plastic Surgery

## 2023-10-02 VITALS — BP 105/66 | HR 85 | Ht 66.0 in | Wt 130.0 lb

## 2023-10-02 DIAGNOSIS — N63 Unspecified lump in unspecified breast: Secondary | ICD-10-CM

## 2023-10-02 DIAGNOSIS — Z853 Personal history of malignant neoplasm of breast: Secondary | ICD-10-CM | POA: Diagnosis not present

## 2023-10-02 DIAGNOSIS — Z9889 Other specified postprocedural states: Secondary | ICD-10-CM

## 2023-10-02 DIAGNOSIS — T8544XA Capsular contracture of breast implant, initial encounter: Secondary | ICD-10-CM

## 2023-10-02 DIAGNOSIS — T8543XA Leakage of breast prosthesis and implant, initial encounter: Secondary | ICD-10-CM

## 2023-10-02 NOTE — Progress Notes (Signed)
Referring Provider Caro Laroche, DO [address] [place],  [place] [address]   CC:  Chief Complaint  Patient presents with   Consult           Makayla Owens is an 62 y.o. female.  HPI: Makayla Owens is a 62 year old female who presents today for evaluation of her bilateral breast reconstruction.  The patient has been having increasing pain in the left axilla and has a palpable abnormality along the left axillary border of the reconstructed breast.  Patient states that she had bilateral mastectomies in 2009 with immediate placement of tissue expanders.  The tissue expanders were subsequently replaced with silicone implants.  She notes that her cancer was particularly aggressive having shown up after missing only 1 mammogram.  Allergies  Allergen Reactions   Latex Rash   Nitrofurantoin Itching and Rash   Elavil [Amitriptyline]     Felt bad and seemed to cause UTIs   Lipitor [Atorvastatin] Nausea Only    Muscle pain and nausea   Morphine And Codeine Other (See Comments)    Does not like side effects, pt could not sleep until out of her system   Other     Pt does not want narcotics   Statins     Body aches   Zofran [Ondansetron Hcl] Nausea And Vomiting    Outpatient Encounter Medications as of 10/02/2023  Medication Sig Note   citalopram (CELEXA) 40 MG tablet Take 1 tablet (40 mg total) by mouth daily.    empagliflozin (JARDIANCE) 10 MG TABS tablet Take 1 tablet (10 mg total) by mouth daily. 02/19/2023: On hold for procedure   ibuprofen (ADVIL) 200 MG tablet Take 400 mg by mouth every 6 (six) hours as needed for moderate pain.    metFORMIN (GLUCOPHAGE) 1000 MG tablet Take 1 tablet (1,000 mg total) by mouth 2 (two) times daily with a meal.    Multiple Vitamin (MULTIVITAMIN WITH MINERALS) TABS tablet Take 1 tablet by mouth daily.    rosuvastatin (CRESTOR) 20 MG tablet Take 1 tablet (20 mg total) by mouth daily.    triamcinolone ointment (KENALOG) 0.5 % Apply 1 Application  topically 2 (two) times daily.    No facility-administered encounter medications on file as of 10/02/2023.     Past Medical History:  Diagnosis Date   Breast cancer (HCC)    Chronic kidney disease    Depression    Diabetes mellitus    Gall bladder stones    Migraines     Past Surgical History:  Procedure Laterality Date   BREAST SURGERY     patient states both breasts removed, but cancer was on the left side, prophylactically removed right breast.   HYSTEROSCOPY WITH D & C     MASTECTOMY     Bilateral   MASTECTOMY Bilateral     Family History  Problem Relation Age of Onset   Cancer Mother    Heart failure Mother    Diabetes Father    Heart failure Father    Cancer Father    Stroke Father     Social History   Social History Narrative   Not on file     Review of Systems General: Denies fevers, chills, weight loss CV: Denies chest pain, shortness of breath, palpitations Breast: Bilateral breast reconstruction with silicone implants.  Patient has pain in the left axilla and left breast.  She notes that she has a palpable abnormality on physical exam in the left breast.  Physical  Exam    10/02/2023    9:12 AM 09/26/2023    8:41 AM 08/26/2023    8:18 AM  Vitals with BMI  Height 5\' 6"  5\' 6"  5\' 6"   Weight 130 lbs 130 lbs 131 lbs 10 oz  BMI 20.99 20.99 21.25  Systolic 105 102 644  Diastolic 66 70 70  Pulse 85 96 88    General:  No acute distress,  Alert and oriented, Non-Toxic, Normal speech and affect Breast: Patient has bilateral breast reconstruction.  The implants are palpable bilaterally and there is a firm mass in the left axilla. She has grade two capsular contraction bilaterally with a palpable abnormality in the left lateral breast. Mammogram: Patient is status post bilateral mastectomy Assessment/Plan Bilateral breast reconstruction, left breast mass: Patient underwent reconstruction with silicone implants in 2009.  The implants have never been  interrogated.  The FDA recommends interrogation of the implants at 5 to 7 years postinitial implantation and every 2 to 3 years thereafter to detect rupture.  Additionally the patient had a particularly aggressive breast cancer and an MRI would be appropriate screening in a patient with a new breast mass. I will schedule her for an MRI.  Based on the results we will move forward appropriately.  She understands that this may include replacement of the implants versus capsulectomy on the left versus watching with continued follow-up. She will follow-up with me after the MRI is complete.  30 minutes spent reviewing chart, examining patient, coordinating care, and documenting.  Santiago Glad 10/02/2023, 9:48 AM

## 2023-10-07 ENCOUNTER — Telehealth: Payer: Self-pay

## 2023-10-07 NOTE — Telephone Encounter (Signed)
Faxed for appeal to change location of site to [facility] location: [address] [place], [place]  [address] NPI [id number]. Called patient to advise status, VM is not set up, nor does she have a Mychart.  Received fax confirmation 10/07/2023.

## 2023-10-09 ENCOUNTER — Telehealth: Payer: Self-pay

## 2023-10-09 NOTE — Telephone Encounter (Addendum)
Error

## 2023-10-14 ENCOUNTER — Telehealth: Payer: Self-pay

## 2023-10-14 NOTE — Telephone Encounter (Signed)
[employer] has overturned appeal on the location of site at [facility] [address] [place], [place], [place].  New authorization# [id number].  This also includes the MRI of the breast cpt [phone] that was approved 10/07/2023.  Patient is aware and provided phone# to call and schedule appointment

## 2023-10-16 ENCOUNTER — Telehealth: Payer: Self-pay

## 2023-10-16 NOTE — Telephone Encounter (Signed)
Spoke with Maudry Mayhew. NWGN#[id number] for CPT [id number] new Authorization [id number]. For location [facility] [address] [place].

## 2023-10-21 ENCOUNTER — Ambulatory Visit (HOSPITAL_COMMUNITY): Payer: Medicaid Other

## 2023-10-21 ENCOUNTER — Encounter (HOSPITAL_COMMUNITY): Payer: Self-pay

## 2023-10-29 ENCOUNTER — Other Ambulatory Visit: Payer: Self-pay | Admitting: Student

## 2023-10-29 DIAGNOSIS — T8543XA Leakage of breast prosthesis and implant, initial encounter: Secondary | ICD-10-CM

## 2023-10-29 DIAGNOSIS — Z9889 Other specified postprocedural states: Secondary | ICD-10-CM

## 2023-10-29 DIAGNOSIS — N63 Unspecified lump in unspecified breast: Secondary | ICD-10-CM

## 2023-10-29 DIAGNOSIS — T8544XA Capsular contracture of breast implant, initial encounter: Secondary | ICD-10-CM

## 2023-10-29 NOTE — Progress Notes (Signed)
Updated MRI breast order per request of radiology

## 2023-12-15 ENCOUNTER — Other Ambulatory Visit: Payer: Medicaid Other

## 2024-01-19 ENCOUNTER — Ambulatory Visit
Admission: RE | Admit: 2024-01-19 | Discharge: 2024-01-19 | Disposition: A | Discharge status: Home / Self Care | Payer: Medicaid Other | Source: Ambulatory Visit | Attending: Student

## 2024-01-19 DIAGNOSIS — Z803 Family history of malignant neoplasm of breast: Secondary | ICD-10-CM | POA: Diagnosis not present

## 2024-01-19 DIAGNOSIS — N644 Mastodynia: Secondary | ICD-10-CM | POA: Diagnosis not present

## 2024-01-19 DIAGNOSIS — Z9889 Other specified postprocedural states: Secondary | ICD-10-CM

## 2024-01-19 MED ORDER — GADOPICLENOL 0.5 MMOL/ML IV SOLN
6.0000 mL | Freq: Once | INTRAVENOUS | Status: AC | PRN
Start: 2024-01-19 — End: 2024-01-19
  Administered 2024-01-19: 6 mL via INTRAVENOUS

## 2024-01-29 ENCOUNTER — Telehealth: Payer: Self-pay | Admitting: Plastic Surgery

## 2024-01-29 NOTE — Telephone Encounter (Signed)
 Lvmail to sch to discuss MRI results

## 2024-02-05 ENCOUNTER — Other Ambulatory Visit: Payer: Self-pay

## 2024-02-05 DIAGNOSIS — E7800 Pure hypercholesterolemia, unspecified: Secondary | ICD-10-CM

## 2024-02-05 MED ORDER — ROSUVASTATIN CALCIUM 20 MG PO TABS: 20.0000 mg | ORAL_TABLET | Freq: Every day | ORAL | 3 refills | Status: DC

## 2024-02-06 ENCOUNTER — Ambulatory Visit: Payer: Medicaid Other | Admitting: Plastic Surgery

## 2024-02-06 DIAGNOSIS — Z853 Personal history of malignant neoplasm of breast: Secondary | ICD-10-CM

## 2024-02-06 DIAGNOSIS — N6459 Other signs and symptoms in breast: Secondary | ICD-10-CM | POA: Diagnosis not present

## 2024-02-06 DIAGNOSIS — Z9889 Other specified postprocedural states: Secondary | ICD-10-CM

## 2024-02-06 NOTE — Progress Notes (Signed)
 Makayla Owens follows up today after her MRI.  Study shows that the implants are intact with no intra or extracapsular rupture.  There are no suspicious findings on MRI and the MRI was read as BI-RADS 1 with recommendations for targeted ultrasound if clinically necessary and otherwise continue yearly follow-up screening.  I have discussed this with Makayla Owens and we have decided together that she will not have the ultrasound but that if she will continue her yearly mammographic screening.  Per FDA recommendations she should have an MRI again in 2 to 3 years to interrogate her implants.  She will follow-up with me in 1 year sooner if necessary.  20 minutes spent reviewing patient's chart and MRI findings, discussing findings with the patient.  Coordinating care and documenting.

## 2024-02-07 ENCOUNTER — Encounter: Payer: Self-pay | Admitting: Family Medicine

## 2024-02-07 ENCOUNTER — Ambulatory Visit: Payer: Medicaid Other | Admitting: Family Medicine

## 2024-02-07 VITALS — BP 115/85 | HR 89 | Ht 66.0 in | Wt 134.8 lb

## 2024-02-07 DIAGNOSIS — R61 Generalized hyperhidrosis: Secondary | ICD-10-CM | POA: Diagnosis not present

## 2024-02-07 DIAGNOSIS — E1140 Type 2 diabetes mellitus with diabetic neuropathy, unspecified: Secondary | ICD-10-CM | POA: Diagnosis not present

## 2024-02-07 DIAGNOSIS — Z23 Encounter for immunization: Secondary | ICD-10-CM

## 2024-02-07 DIAGNOSIS — Z1211 Encounter for screening for malignant neoplasm of colon: Secondary | ICD-10-CM | POA: Diagnosis not present

## 2024-02-07 DIAGNOSIS — F17200 Nicotine dependence, unspecified, uncomplicated: Secondary | ICD-10-CM

## 2024-02-07 DIAGNOSIS — N181 Chronic kidney disease, stage 1: Secondary | ICD-10-CM

## 2024-02-07 DIAGNOSIS — E1122 Type 2 diabetes mellitus with diabetic chronic kidney disease: Secondary | ICD-10-CM

## 2024-02-07 LAB — POCT GLYCOSYLATED HEMOGLOBIN (HGB A1C): HbA1c, POC (controlled diabetic range): 6.9 % (ref 0.0–7.0)

## 2024-02-07 NOTE — Patient Instructions (Signed)
 It was great to see you!  Our plans for today:  - Call the [facility] for an appointment [facility]  Address: [address], [place], [place] [address] Phone: [phone] - Make an appointment for a pap smear - We are referring you to GI for a colonoscopy. Let us know if you don't hear about an appointment in the next few weeks.  - We gave you your flu and pneumonia shots today.  We are checking some labs today, we will release these results to your MyChart.  Take care and seek immediate care sooner if you develop any concerns.   Dr. Linwood Dibbles
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
## 2024-02-07 NOTE — Assessment & Plan Note (Signed)
 Recheck A1c

## 2024-02-07 NOTE — Assessment & Plan Note (Signed)
 Suspect 2/2 menopause, environmental. Exam benign. Will obtain labs to rule out other contributors. Will work to update cancer screenings. F/u 1 month.

## 2024-02-08 LAB — CBC WITH DIFFERENTIAL/PLATELET
Basophils Absolute: 0.1 10*3/uL (ref 0.0–0.2)
Basos: 1 %
EOS (ABSOLUTE): 0.1 10*3/uL (ref 0.0–0.4)
Eos: 1 %
Hematocrit: 43.2 % (ref 34.0–46.6)
Hemoglobin: 14.7 g/dL (ref 11.1–15.9)
Immature Grans (Abs): 0 10*3/uL (ref 0.0–0.1)
Immature Granulocytes: 0 %
Lymphocytes Absolute: 2.5 10*3/uL (ref 0.7–3.1)
Lymphs: 31 %
MCH: 31 pg (ref 26.6–33.0)
MCHC: 34 g/dL (ref 31.5–35.7)
MCV: 91 fL (ref 79–97)
Monocytes Absolute: 0.4 10*3/uL (ref 0.1–0.9)
Monocytes: 5 %
Neutrophils Absolute: 5 10*3/uL (ref 1.4–7.0)
Neutrophils: 62 %
Platelets: 214 10*3/uL (ref 150–450)
RBC: 4.74 x10E6/uL (ref 3.77–5.28)
RDW: 13.1 % (ref 11.7–15.4)
WBC: 8.1 10*3/uL (ref 3.4–10.8)

## 2024-02-08 LAB — BASIC METABOLIC PANEL
BUN/Creatinine Ratio: 31 — ABNORMAL HIGH (ref 12–28)
BUN: 16 mg/dL (ref 8–27)
CO2: 20 mmol/L (ref 20–29)
Calcium: 8.9 mg/dL (ref 8.7–10.3)
Chloride: 100 mmol/L (ref 96–106)
Creatinine, Ser: 0.52 mg/dL — ABNORMAL LOW (ref 0.57–1.00)
Glucose: 87 mg/dL (ref 70–99)
Potassium: 4 mmol/L (ref 3.5–5.2)
Sodium: 137 mmol/L (ref 134–144)
eGFR: 106 mL/min/{1.73_m2} (ref >59)

## 2024-02-08 LAB — TSH RFX ON ABNORMAL TO FREE T4: TSH: 2.05 u[IU]/mL (ref 0.450–4.500)

## 2024-02-08 LAB — HIV ANTIBODY (ROUTINE TESTING W REFLEX): HIV Screen 4th Generation wRfx: NONREACTIVE

## 2024-02-09 LAB — MICROALBUMIN / CREATININE URINE RATIO
Creatinine, Urine: 10 mg/dL
Microalb/Creat Ratio: 84 mg/g{creat} — ABNORMAL HIGH (ref 0–29)
Microalbumin, Urine: 8.4 ug/mL

## 2024-02-12 ENCOUNTER — Encounter: Payer: Self-pay | Admitting: *Deleted

## 2024-02-28 ENCOUNTER — Encounter: Payer: Self-pay | Admitting: Family Medicine

## 2024-02-28 ENCOUNTER — Other Ambulatory Visit: Payer: Self-pay

## 2024-02-28 DIAGNOSIS — E1122 Type 2 diabetes mellitus with diabetic chronic kidney disease: Secondary | ICD-10-CM

## 2024-03-02 MED ORDER — EMPAGLIFLOZIN 10 MG PO TABS: 10.0000 mg | ORAL_TABLET | Freq: Every day | ORAL | 3 refills | Status: DC

## 2024-03-03 ENCOUNTER — Encounter (HOSPITAL_COMMUNITY): Payer: Self-pay | Admitting: Emergency Medicine

## 2024-03-03 ENCOUNTER — Other Ambulatory Visit: Payer: Self-pay

## 2024-03-03 ENCOUNTER — Emergency Department (HOSPITAL_COMMUNITY)
Admission: EM | Admit: 2024-03-03 | Discharge: 2024-03-03 | Disposition: A | Discharge status: Home / Self Care | Attending: Emergency Medicine | Admitting: Emergency Medicine

## 2024-03-03 ENCOUNTER — Emergency Department (HOSPITAL_COMMUNITY)

## 2024-03-03 DIAGNOSIS — R600 Localized edema: Secondary | ICD-10-CM | POA: Insufficient documentation

## 2024-03-03 DIAGNOSIS — F17210 Nicotine dependence, cigarettes, uncomplicated: Secondary | ICD-10-CM | POA: Insufficient documentation

## 2024-03-03 DIAGNOSIS — R7989 Other specified abnormal findings of blood chemistry: Secondary | ICD-10-CM | POA: Insufficient documentation

## 2024-03-03 DIAGNOSIS — N189 Chronic kidney disease, unspecified: Secondary | ICD-10-CM | POA: Diagnosis not present

## 2024-03-03 DIAGNOSIS — E1122 Type 2 diabetes mellitus with diabetic chronic kidney disease: Secondary | ICD-10-CM | POA: Diagnosis not present

## 2024-03-03 DIAGNOSIS — Z853 Personal history of malignant neoplasm of breast: Secondary | ICD-10-CM | POA: Diagnosis not present

## 2024-03-03 DIAGNOSIS — R0609 Other forms of dyspnea: Secondary | ICD-10-CM | POA: Diagnosis not present

## 2024-03-03 DIAGNOSIS — M7989 Other specified soft tissue disorders: Secondary | ICD-10-CM | POA: Diagnosis not present

## 2024-03-03 DIAGNOSIS — R0602 Shortness of breath: Secondary | ICD-10-CM | POA: Diagnosis not present

## 2024-03-03 DIAGNOSIS — I517 Cardiomegaly: Secondary | ICD-10-CM | POA: Diagnosis not present

## 2024-03-03 DIAGNOSIS — I509 Heart failure, unspecified: Secondary | ICD-10-CM

## 2024-03-03 LAB — CBC WITH DIFFERENTIAL/PLATELET
Abs Immature Granulocytes: 0.01 10*3/uL (ref 0.00–0.07)
Basophils Absolute: 0.1 10*3/uL (ref 0.0–0.1)
Basophils Relative: 1 %
Eosinophils Absolute: 0.1 10*3/uL (ref 0.0–0.5)
Eosinophils Relative: 1 %
HCT: 44.3 % (ref 36.0–46.0)
Hemoglobin: 15.2 g/dL — ABNORMAL HIGH (ref 12.0–15.0)
Immature Granulocytes: 0 %
Lymphocytes Relative: 25 %
Lymphs Abs: 2 10*3/uL (ref 0.7–4.0)
MCH: 30.9 pg (ref 26.0–34.0)
MCHC: 34.3 g/dL (ref 30.0–36.0)
MCV: 90 fL (ref 80.0–100.0)
Monocytes Absolute: 0.6 10*3/uL (ref 0.1–1.0)
Monocytes Relative: 7 %
Neutro Abs: 5.2 10*3/uL (ref 1.7–7.7)
Neutrophils Relative %: 66 %
Platelets: 256 10*3/uL (ref 150–400)
RBC: 4.92 MIL/uL (ref 3.87–5.11)
RDW: 15 % (ref 11.5–15.5)
WBC: 7.9 10*3/uL (ref 4.0–10.5)
nRBC: 0 % (ref 0.0–0.2)

## 2024-03-03 LAB — COMPREHENSIVE METABOLIC PANEL WITH GFR
ALT: 48 U/L — ABNORMAL HIGH (ref 0–44)
AST: 23 U/L (ref 15–41)
Albumin: 3.4 g/dL — ABNORMAL LOW (ref 3.5–5.0)
Alkaline Phosphatase: 74 U/L (ref 38–126)
Anion gap: 11 (ref 5–15)
BUN: 13 mg/dL (ref 6–20)
CO2: 23 mmol/L (ref 22–32)
Calcium: 8.9 mg/dL (ref 8.9–10.3)
Chloride: 99 mmol/L (ref 98–111)
Creatinine, Ser: 0.58 mg/dL (ref 0.44–1.00)
GFR, Estimated: >60 mL/min (ref >60)
Glucose, Bld: 145 mg/dL — ABNORMAL HIGH (ref 70–99)
Potassium: 3.8 mmol/L (ref 3.5–5.1)
Sodium: 133 mmol/L — ABNORMAL LOW (ref 135–145)
Total Bilirubin: 0.7 mg/dL (ref 0.0–1.2)
Total Protein: 5.9 g/dL — ABNORMAL LOW (ref 6.5–8.1)

## 2024-03-03 LAB — BRAIN NATRIURETIC PEPTIDE: B Natriuretic Peptide: 2785 pg/mL — ABNORMAL HIGH (ref 0.0–100.0)

## 2024-03-03 LAB — TROPONIN I (HIGH SENSITIVITY): Troponin I (High Sensitivity): 17 ng/L (ref <18)

## 2024-03-03 MED ORDER — EMPAGLIFLOZIN 10 MG PO TABS
10.0000 mg | ORAL_TABLET | Freq: Every day | ORAL | Status: DC
  Filled 2024-03-03: qty 1

## 2024-03-03 MED ORDER — MAGNESIUM HYDROXIDE 400 MG/5ML PO SUSP: 30.0000 mL | Freq: Every day | ORAL | Status: DC | PRN

## 2024-03-03 MED ORDER — CITALOPRAM HYDROBROMIDE 20 MG PO TABS: 40.0000 mg | ORAL_TABLET | Freq: Every day | ORAL | Status: DC

## 2024-03-03 MED ORDER — FUROSEMIDE 10 MG/ML IJ SOLN: 40.0000 mg | Freq: Two times a day (BID) | INTRAMUSCULAR | Status: DC

## 2024-03-03 MED ORDER — ACETAMINOPHEN 325 MG PO TABS: 650.0000 mg | ORAL_TABLET | Freq: Four times a day (QID) | ORAL | Status: DC | PRN

## 2024-03-03 MED ORDER — ROSUVASTATIN CALCIUM 20 MG PO TABS: 20.0000 mg | ORAL_TABLET | Freq: Every day | ORAL | Status: DC

## 2024-03-03 MED ORDER — TRAZODONE HCL 50 MG PO TABS: 25.0000 mg | ORAL_TABLET | Freq: Every evening | ORAL | Status: DC | PRN

## 2024-03-03 MED ORDER — ACETAMINOPHEN 650 MG RE SUPP: 650.0000 mg | Freq: Four times a day (QID) | RECTAL | Status: DC | PRN

## 2024-03-03 MED ORDER — ENOXAPARIN SODIUM 40 MG/0.4ML IJ SOSY: 40.0000 mg | PREFILLED_SYRINGE | INTRAMUSCULAR | Status: DC

## 2024-03-03 MED ORDER — ADULT MULTIVITAMIN W/MINERALS CH: 1.0000 | ORAL_TABLET | Freq: Every day | ORAL | Status: DC

## 2024-03-03 MED ORDER — METOCLOPRAMIDE HCL 5 MG/ML IJ SOLN: 5.0000 mg | Freq: Four times a day (QID) | INTRAMUSCULAR | Status: DC | PRN

## 2024-03-03 MED ORDER — FUROSEMIDE 10 MG/ML IJ SOLN
20.0000 mg | Freq: Once | INTRAMUSCULAR | Status: AC
  Administered 2024-03-03: 20 mg via INTRAVENOUS
  Filled 2024-03-03: qty 2

## 2024-03-03 MED ORDER — NICOTINE 14 MG/24HR TD PT24
14.0000 mg | MEDICATED_PATCH | Freq: Once | TRANSDERMAL | Status: DC
Start: 2024-03-03 — End: 2024-03-03
  Administered 2024-03-03: 14 mg via TRANSDERMAL
  Filled 2024-03-03: qty 1

## 2024-03-03 MED ORDER — FUROSEMIDE 20 MG PO TABS: 20.0000 mg | ORAL_TABLET | Freq: Every day | ORAL | 1 refills | Status: DC

## 2024-03-03 NOTE — ED Provider Notes (Addendum)
 [facility]-EMERGENCY DEPT [facility] Emergency Department Provider Note MRN:  [id number]  Arrival date & time: 03/03/24     Chief Complaint   Leg Swelling   History of Present Illness   Makayla Owens is a 63 y.o. year-old female with a history of CKD, diabetes, breast cancer presenting to the ED with chief complaint of leg swelling.  Bilateral symmetric leg swelling new over the past 1 or 2 weeks.  After only 10 minutes of being up in the morning her legs swell dramatically.  Has occasional chest discomfort but not currently, denies orthopnea, no abdominal pain, does not drink alcohol, smokes cigarettes.  No numbness or weakness to the arms or legs.  Review of Systems  A thorough review of systems was obtained and all systems are negative except as noted in the HPI and PMH.   Patient's Health History    Past Medical History:  Diagnosis Date   Breast cancer (HCC)    Chronic kidney disease    Depression    Diabetes mellitus    Gall bladder stones    Migraines     Past Surgical History:  Procedure Laterality Date   BREAST SURGERY     patient states both breasts removed, but cancer was on the left side, prophylactically removed right breast.   HYSTEROSCOPY WITH D & C     MASTECTOMY     Bilateral   MASTECTOMY Bilateral     Family History  Problem Relation Age of Onset   Cancer Mother    Heart failure Mother    Diabetes Father    Heart failure Father    Cancer Father    Stroke Father     Social History   Socioeconomic History   Marital status: Married    Spouse name: Not on file   Number of children: Not on file   Years of education: Not on file   Highest education level: Not on file  Occupational History   Not on file  Tobacco Use   Smoking status: Every Day    Current packs/day: 0.25    Average packs/day: 0.3 packs/day for 17.0 years (4.3 ttl pk-yrs)    Types: Cigarettes   Smokeless tobacco: Never  Vaping Use   Vaping status: Never Used  Substance and  Sexual Activity   Alcohol use: No    Alcohol/week: 0.0 standard drinks of alcohol   Drug use: No   Sexual activity: Yes  Other Topics Concern   Not on file  Social History Narrative   Not on file   Social Drivers of Health   Financial Resource Strain: High Risk (04/24/2023)   Received from [facility], [facility]   Overall Financial Resource Strain (CARDIA)    Difficulty of Paying Living Expenses: Hard  Food Insecurity: Food Insecurity Present (04/24/2023)   Received from [facility], [facility]   Hunger Vital Sign    Worried About Running Out of Food in the Last Year: Sometimes true    Ran Out of Food in the Last Year: Sometimes true  Transportation Needs: No Transportation Needs (04/24/2023)   Received from [facility], [facility]   PRAPARE - Transportation    Lack of Transportation (Medical): No    Lack of Transportation (Non-Medical): No  Physical Activity: Not on file  Stress: Not on file  Social Connections: Unknown (04/17/2023)   Received from [facility], [facility]   Social Network    Social Network: Not on file  Intimate Partner Violence: Unknown (  04/17/2023)   Received from [facility], [facility]   HITS    Physically Hurt: Not on file    Insult or Talk Down To: Not on file    Threaten Physical Harm: Not on file    Scream or Curse: Not on file     Physical Exam   Vitals:   03/03/24 0051  BP: (!) 120/93  Pulse: 93  Resp: 18  Temp: (!) 97.4 F (36.3 C)  SpO2: 95%    CONSTITUTIONAL: Well-appearing, NAD NEURO/PSYCH:  Alert and oriented x 3, no focal deficits EYES:  eyes equal and reactive ENT/NECK:  no LAD, no JVD CARDIO: Regular rate, well-perfused, normal S1 and S2 PULM:  CTAB no wheezing or rhonchi GI/GU:  non-distended, non-tender MSK/SPINE:  No gross deformities, pitting edema to bilateral ankles, neurovascularly intact lower extremities SKIN:  no rash, atraumatic   *Additional and/or pertinent findings included in MDM  below  Diagnostic and Interventional Summary    EKG Interpretation Date/Time:  Tuesday March 03 2024 01:28:44 EDT Ventricular Rate:  88 PR Interval:  152 QRS Duration:  169 QT Interval:  454 QTC Calculation: 550 R Axis:   -74  Text Interpretation: Sinus rhythm Biatrial enlargement Left bundle branch block Confirmed by Kennis Carina [phone]) on 03/03/2024 1:51:02 AM       Labs Reviewed  COMPREHENSIVE METABOLIC PANEL WITH GFR - Abnormal; Notable for the following components:      Result Value   Sodium 133 (*)    Glucose, Bld 145 (*)    Total Protein 5.9 (*)    Albumin 3.4 (*)    ALT 48 (*)    All other components within normal limits  BRAIN NATRIURETIC PEPTIDE - Abnormal; Notable for the following components:   B Natriuretic Peptide 2,785.0 (*)    All other components within normal limits  CBC WITH DIFFERENTIAL/PLATELET - Abnormal; Notable for the following components:   Hemoglobin 15.2 (*)    All other components within normal limits  BASIC METABOLIC PANEL WITH GFR  CBC  TROPONIN I (HIGH SENSITIVITY)    DG Chest Port 1 View  Final Result      Medications  nicotine (NICODERM CQ - dosed in mg/24 hours) patch 14 mg (14 mg Transdermal Patch Applied 03/03/24 0236)  rosuvastatin (CRESTOR) tablet 20 mg (has no administration in time range)  citalopram (CELEXA) tablet 40 mg (has no administration in time range)  empagliflozin (JARDIANCE) tablet 10 mg (has no administration in time range)  multivitamin with minerals tablet 1 tablet (has no administration in time range)  furosemide (LASIX) injection 40 mg (has no administration in time range)  enoxaparin (LOVENOX) injection 40 mg (has no administration in time range)  acetaminophen (TYLENOL) tablet 650 mg (has no administration in time range)    Or  acetaminophen (TYLENOL) suppository 650 mg (has no administration in time range)  traZODone (DESYREL) tablet 25 mg (has no administration in time range)  magnesium hydroxide (MILK OF  MAGNESIA) suspension 30 mL (has no administration in time range)  metoCLOPramide (REGLAN) injection 5 mg (has no administration in time range)  furosemide (LASIX) injection 20 mg (20 mg Intravenous Given 03/03/24 0236)     Procedures  /  Critical Care Procedures  ED Course and Medical Decision Making  Initial Impression and Ddx Acute bilateral leg swelling, seems to be dependent on positioning and so venous insufficiency is favored.  Also considering CHF, hepatic or renal failure.  Past medical/surgical history that increases complexity of ED encounter:  CKD, diabetes, tobacco use  Interpretation of Diagnostics I personally reviewed the EKG and my interpretation is as follows: Left bundle branch block similar to prior  No significant blood count or electrolyte disturbance.  Markedly elevated BNP  Patient Reassessment and Ultimate Disposition/Management     Increased suspicion for new onset CHF given the laboratory findings.  Patient is endorsing dyspnea on exertion especially when she first wakes up in the morning.  Her SpO2 on room air is hovering between 88 and 93%.  Suspect patient would benefit from admission, formal echocardiogram, excetra.  Will request hospitalist admission.  Providing dose of Lasix.  3 AM update: Patient has changed her mind about staying in the hospital.  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  And so patient is appreciative but needs to leave.  Her vitals remain normal, she is in no acute distress ambulating without issue.  Technically there is no hard indication for admission, though I feel it would expedite her improvement.  She is appropriate for discharge, will provide her a prescription for Lasix, referral to cardiology, very strict return precautions, she agrees to call her PCP later this morning.  Patient management required discussion with the following services or  consulting groups:  Hospitalist Service  Complexity of Problems Addressed Acute illness or injury that poses threat of life of bodily function  Additional Data Reviewed and Analyzed Further history obtained from: Prior labs/imaging results  Additional Factors Impacting ED Encounter Risk Consideration of hospitalization  Elmer Sow. Pilar Plate, MD [facility] Emergency Medicine [facility] [email or web address]  Final Clinical Impressions(s) / ED Diagnoses     ICD-10-CM   1. Leg edema  R60.0     2. DOE (dyspnea on exertion)  R06.09 Ambulatory referral to Cardiology      ED Discharge Orders          Ordered    furosemide (LASIX) 20 MG tablet  Daily        03/03/24 0315    Ambulatory referral to Cardiology        03/03/24 0315             Discharge Instructions Discussed with and Provided to Patient:     Discharge Instructions      You were evaluated in the Emergency Department and after careful evaluation, we did not find any emergent condition requiring admission or further testing in the hospital.  Your exam/testing today is overall reassuring.  Your symptoms may be related to heart failure.  We recommended admission to the hospital but you preferred close outpatient follow-up.  Take the Lasix medication daily to help remove the extra fluid.  Follow-up closely with your primary care doctor as well as cardiology.  Please return to the Emergency Department if you experience any worsening of your condition.   Thank you for allowing [place] to be a part of your care.       Sabas Sous, MD 03/03/24 Emeline Darling    Sabas Sous, MD 03/03/24 [id number]

## 2024-03-03 NOTE — ED Triage Notes (Signed)
 Pt c/o lower leg and feet swelling since the first of March but worse this last week.

## 2024-03-03 NOTE — Discharge Instructions (Signed)
 You were evaluated in the Emergency Department and after careful evaluation, we did not find any emergent condition requiring admission or further testing in the hospital.  Your exam/testing today is overall reassuring.  Your symptoms may be related to heart failure.  We recommended admission to the hospital but you preferred close outpatient follow-up.  Take the Lasix medication daily to help remove the extra fluid.  Follow-up closely with your primary care doctor as well as cardiology.  Please return to the Emergency Department if you experience any worsening of your condition.   Thank you for allowing [place] to be a part of your care.

## 2024-03-04 ENCOUNTER — Telehealth: Payer: Self-pay

## 2024-03-04 ENCOUNTER — Ambulatory Visit: Attending: Cardiovascular Disease | Admitting: Cardiovascular Disease

## 2024-03-04 ENCOUNTER — Encounter: Payer: Self-pay | Admitting: Cardiovascular Disease

## 2024-03-04 VITALS — BP 114/66 | HR 90 | Ht 66.0 in | Wt 136.4 lb

## 2024-03-04 DIAGNOSIS — Z8249 Family history of ischemic heart disease and other diseases of the circulatory system: Secondary | ICD-10-CM | POA: Diagnosis not present

## 2024-03-04 DIAGNOSIS — E7800 Pure hypercholesterolemia, unspecified: Secondary | ICD-10-CM | POA: Diagnosis not present

## 2024-03-04 DIAGNOSIS — R600 Localized edema: Secondary | ICD-10-CM | POA: Diagnosis not present

## 2024-03-04 DIAGNOSIS — I509 Heart failure, unspecified: Secondary | ICD-10-CM

## 2024-03-04 DIAGNOSIS — F17200 Nicotine dependence, unspecified, uncomplicated: Secondary | ICD-10-CM

## 2024-03-04 DIAGNOSIS — R0602 Shortness of breath: Secondary | ICD-10-CM | POA: Diagnosis not present

## 2024-03-04 DIAGNOSIS — Z8679 Personal history of other diseases of the circulatory system: Secondary | ICD-10-CM

## 2024-03-04 NOTE — Patient Instructions (Signed)
 Medication Instructions:  NO CHANGES  *If you need a refill on your cardiac medications before your next appointment, please call your pharmacy*  Testing/Procedures: Your physician has requested that you have an echocardiogram. Echocardiography is a painless test that uses sound waves to create images of your heart. It provides your doctor with information about the size and shape of your heart and how well your heart's chambers and valves are working. This procedure takes approximately one hour. There are no restrictions for this procedure. Please do NOT wear cologne, perfume, aftershave, or lotions (deodorant is allowed). Please arrive 15 minutes prior to your appointment time.  Please note: We ask at that you not bring children with you during ultrasound (echo/ vascular) testing. Due to room size and safety concerns, children are not allowed in the ultrasound rooms during exams. Our front office staff cannot provide observation of children in our lobby area while testing is being conducted. An adult accompanying a patient to their appointment will only be allowed in the ultrasound room at the discretion of the ultrasound technician under special circumstances. We apologize for any inconvenience.  Dr. Allyson Sabal has ordered a CT coronary calcium score.   Test locations:  [facility]  [place] [facility] at [facility]  This is $99 out of pocket.   Coronary CalciumScan A coronary calcium scan is an imaging test used to look for deposits of calcium and other fatty materials (plaques) in the inner lining of the blood vessels of the heart (coronary arteries). These deposits of calcium and plaques can partly clog and narrow the coronary arteries without producing any symptoms or warning signs. This puts a person at risk for a heart attack. This test can detect these deposits before symptoms develop. Tell a health care provider  about: Any allergies you have. All medicines you are taking, including vitamins, herbs, eye drops, creams, and over-the-counter medicines. Any problems you or family members have had with anesthetic medicines. Any blood disorders you have. Any surgeries you have had. Any medical conditions you have. Whether you are pregnant or may be pregnant. What are the risks? Generally, this is a safe procedure. However, problems may occur, including: Harm to a pregnant woman and her unborn baby. This test involves the use of radiation. Radiation exposure can be dangerous to a pregnant woman and her unborn baby. If you are pregnant, you generally should not have this procedure done. Slight increase in the risk of cancer. This is because of the radiation involved in the test. What happens before the procedure? No preparation is needed for this procedure. What happens during the procedure? You will undress and remove any jewelry around your neck or chest. You will put on a hospital gown. Sticky electrodes will be placed on your chest. The electrodes will be connected to an electrocardiogram (ECG) machine to record a tracing of the electrical activity of your heart. A CT scanner will take pictures of your heart. During this time, you will be asked to lie still and hold your breath for 2-3 seconds while a picture of your heart is being taken. The procedure may vary among health care providers and hospitals. What happens after the procedure? You can get dressed. You can return to your normal activities. It is up to you to get the results of your test. Ask your health care provider, or the department that is doing the test, when your results will be ready. Summary A coronary calcium  scan is an imaging test used to look for deposits of calcium and other fatty materials (plaques) in the inner lining of the blood vessels of the heart (coronary arteries). Generally, this is a safe procedure. Tell your health care  provider if you are pregnant or may be pregnant. No preparation is needed for this procedure. A CT scanner will take pictures of your heart. You can return to your normal activities after the scan is done. This information is not intended to replace advice given to you by your health care provider. Make sure you discuss any questions you have with your health care provider. Document Released: 05/17/2008 Document Revised: 10/08/2016 Document Reviewed: 10/08/2016 Elsevier Interactive Patient Education  2017 [employer].   Follow-Up: At [facility], you and your health needs are our priority.  As part of our continuing mission to provide you with exceptional heart care, our providers are all part of one team.  This team includes your primary Cardiologist (physician) and Advanced Practice Providers or APPs (Physician Assistants and Nurse Practitioners) who all work together to provide you with the care you need, when you need it.  Your next appointment:    AFTER TESTING is completed (with Dr. Allyson Sabal)  We recommend signing up for the patient portal called "MyChart".  Sign up information is provided on this After Visit Summary.  MyChart is used to connect with patients for Virtual Visits (Telemedicine).  Patients are able to view lab/test results, encounter notes, upcoming appointments, etc.  Non-urgent messages can be sent to your provider as well.   To learn more about what you can do with MyChart, go to [email or web address].        1st Floor: - Lobby - Registration  - Pharmacy  - Lab - Cafe  2nd Floor: - PV Lab - Diagnostic Testing (echo, CT, nuclear med)  3rd Floor: - Vacant  4th Floor: - TCTS (cardiothoracic surgery) - AFib Clinic - Structural Heart Clinic - Vascular Surgery  - Vascular Ultrasound  5th Floor: - [facility] Cardiology (general and EP) - Clinical Pharmacy for coumadin, hypertension, lipid, weight-loss medications, and med management  appointments    Valet parking services will be available as well.

## 2024-03-04 NOTE — Assessment & Plan Note (Signed)
 Ongoing tobacco abuse 1/2 pack a day for the last 30 years recalcitrant to risk factor modification.

## 2024-03-04 NOTE — Assessment & Plan Note (Signed)
 History of hyperlipidemia on statin therapy with lipid profile performed 08/26/2023 revealed a total cholesterol 131, LDL 54 and HDL of 60.

## 2024-03-04 NOTE — Telephone Encounter (Signed)
 Patient states that she can not afford the $99.00 Calcium Score   03-04-24 VB

## 2024-03-04 NOTE — Assessment & Plan Note (Signed)
 Ms Wandrey  was seen in the emergency room yesterday with a 47-month history of increasing dyspnea and lower extreme edema.  She was treated with furosemide with significant improvement in her swelling and symptoms.  Her BNP was elevated at 2785.  We will continue the Lasix.  I am going to get a 2D echo and a coronary calcium score to her stratify.

## 2024-03-04 NOTE — Assessment & Plan Note (Signed)
 Chronic.

## 2024-03-04 NOTE — Progress Notes (Signed)
 03/04/2024 Makayla Owens   01-02-1961  [id number]  Primary Physician Caro Laroche, DO Primary Cardiologist: Runell Gess MD Nicholes Calamity, [place]  HPI:  Makayla Owens is a 63 y.o. thin-appearing married Caucasian female mother of 1 daughter, grandmother of 3 grandchildren referred by the emergency room where she presented yesterday with heart failure.  She is accompanied by Makayla Owens, her mother.  Her risk factors include 15 pack years tobacco abuse currently smoking 1/2 pack/day, treated hyperlipidemia and diabetes.  She has a strong family history of heart disease with both mother and brother who have had stents.  She is never had a heart attack or stroke.  She denies chest pain but has noticed increasing dyspnea and lower extreme edema over the last 3 weeks.  She presented to the emergency room yesterday with lower extremity edema and shortness of breath.  She was treated with IV furosemide and sent home on p.o. furosemide.  Her BNP was elevated at 2785.  She does have a history of chronic left bundle branch block and a remote history of breast cancer in 2008 treated with chemotherapy.   Current Meds  Medication Sig   citalopram (CELEXA) 40 MG tablet Take 1 tablet (40 mg total) by mouth daily.   empagliflozin (JARDIANCE) 10 MG TABS tablet Take 1 tablet (10 mg total) by mouth daily.   furosemide (LASIX) 20 MG tablet Take 1 tablet (20 mg total) by mouth daily.   ibuprofen (ADVIL) 200 MG tablet Take 400 mg by mouth every 6 (six) hours as needed for moderate pain.   metFORMIN (GLUCOPHAGE) 1000 MG tablet Take 1 tablet (1,000 mg total) by mouth 2 (two) times daily with a meal.   Multiple Vitamin (MULTIVITAMIN WITH MINERALS) TABS tablet Take 1 tablet by mouth daily.   rosuvastatin (CRESTOR) 20 MG tablet Take 1 tablet (20 mg total) by mouth daily.   triamcinolone ointment (KENALOG) 0.5 % Apply 1 Application topically 2 (two) times daily.     Allergies  Allergen Reactions    Latex Rash   Nitrofurantoin Itching and Rash   Elavil [Amitriptyline]     Felt bad and seemed to cause UTIs   Lipitor [Atorvastatin] Nausea Only    Muscle pain and nausea   Morphine And Codeine Other (See Comments)    Does not like side effects, pt could not sleep until out of her system   Other     Pt does not want narcotics   Statins     Body aches   Zofran [Ondansetron Hcl] Nausea And Vomiting    Social History   Socioeconomic History   Marital status: Married    Spouse name: Not on file   Number of children: Not on file   Years of education: Not on file   Highest education level: Not on file  Occupational History   Not on file  Tobacco Use   Smoking status: Every Day    Current packs/day: 0.25    Average packs/day: 0.3 packs/day for 17.0 years (4.3 ttl pk-yrs)    Types: Cigarettes   Smokeless tobacco: Never  Vaping Use   Vaping status: Never Used  Substance and Sexual Activity   Alcohol use: No    Alcohol/week: 0.0 standard drinks of alcohol   Drug use: No   Sexual activity: Yes  Other Topics Concern   Not on file  Social History Narrative   Not on file   Social Drivers of Health  Financial Resource Strain: High Risk (04/24/2023)   Received from [facility], [facility]   Overall Financial Resource Strain (CARDIA)    Difficulty of Paying Living Expenses: Hard  Food Insecurity: Food Insecurity Present (04/24/2023)   Received from [facility], [facility]   Hunger Vital Sign    Worried About Running Out of Food in the Last Year: Sometimes true    Ran Out of Food in the Last Year: Sometimes true  Transportation Needs: No Transportation Needs (04/24/2023)   Received [facility], [facility]   PRAPARE - Transportation    Lack of Transportation (Medical): No    Lack of Transportation (Non-Medical): No  Physical Activity: Not on file  Stress: Not on file  Social Connections: Unknown (04/17/2023)   Received from [facility], [facility]    Social Network    Social Network: Not on file  Intimate Partner Violence: Unknown (04/17/2023)   Received from [facility], [facility]   HITS    Physically Hurt: Not on file    Insult or Talk Down To: Not on file    Threaten Physical Harm: Not on file    Scream or Curse: Not on file     Review of Systems: General: negative for chills, fever, night sweats or weight changes.  Cardiovascular: negative for chest pain, dyspnea on exertion, edema, orthopnea, palpitations, paroxysmal nocturnal dyspnea or shortness of breath Dermatological: negative for rash Respiratory: negative for cough or wheezing Urologic: negative for hematuria Abdominal: negative for nausea, vomiting, diarrhea, bright red blood per rectum, melena, or hematemesis Neurologic: negative for visual changes, syncope, or dizziness All other systems reviewed and are otherwise negative except as noted above.    Blood pressure 114/66, pulse 90, height 5\' 6"  (1.676 m), weight 136 lb 6.4 oz (61.9 kg), SpO2 95%.  General appearance: alert and no distress Neck: no adenopathy, no carotid bruit, no JVD, supple, symmetrical, trachea midline, and thyroid not enlarged, symmetric, no tenderness/mass/nodules Lungs: clear to auscultation bilaterally Heart: regular rate and rhythm, S1, S2 normal, no murmur, click, rub or gallop Extremities: extremities normal, atraumatic, no cyanosis or edema Pulses: 2+ and symmetric Skin: Skin color, texture, turgor normal. No rashes or lesions Neurologic: Grossly normal  EKG not performed today      ASSESSMENT AND PLAN:   HYPERCHOLESTEROLEMIA History of hyperlipidemia on statin therapy with lipid profile performed 08/26/2023 revealed a total cholesterol 131, LDL 54 and HDL of 60.  History of left bundle branch block (LBBB) Chronic  Tobacco use disorder Ongoing tobacco abuse 1/2 pack a day for the last 30 years recalcitrant to risk factor modification.  Congestive heart failure [facility]) Makayla Owens  was seen in the emergency room yesterday with a 53-month history of increasing dyspnea and lower extreme edema.  She was treated with furosemide with significant improvement in her swelling and symptoms.  Her BNP was elevated at 2785.  We will continue the Lasix.  I am going to get a 2D echo and a coronary calcium score to her stratify.     Runell Gess MD FACP,FACC,FAHA, [facility] 03/04/2024 2:43 PM

## 2024-03-05 ENCOUNTER — Other Ambulatory Visit (HOSPITAL_COMMUNITY)

## 2024-03-06 ENCOUNTER — Other Ambulatory Visit

## 2024-03-09 ENCOUNTER — Telehealth: Payer: Self-pay | Admitting: Licensed Clinical Social Worker

## 2024-03-10 NOTE — Progress Notes (Signed)
 Heart and Vascular Care Navigation  03/09/2024  Makayla Owens 01-01-1961 [id number]  Reason for Referral: financial concerns, testing recommended Patient is participating in a Managed Medicaid Plan:Yes- [facility]   Engaged with patient by telephone for initial visit for Heart and Vascular Care Coordination.                                                                                                   Assessment:        LCSW received a call back from pt mother Makayla Owens. I shared that since pt does not have DPR on file that I would need verbal permission from pt to speak with Makayla Owens. She was able to get pt on phone who confirmed two identifiers and then gave me verbal permission to speak with her mother. I introduced self, role, and reason for call. Confirmed home address, PCP, and current Medicaid coverage. Pt resides with her husband and her mother who moved in with them after selling her house in [place]. Pt drives and her mother accompanies her on most appts. Pt spouse receives [employer], as does pt mother. Pt and pt mother both receive SNAP benefits at this time, could use assistance with supplemental food at this time. Pt has applied for disability in the past but has been denied sounds like several times.  They are not behind on any housing or utilities that pt mother is aware of but she shares they just do not have additional income to spare for things such as $99 for calcium score. I encouraged her to speak with her family regarding costs of living such as housing/utilities and perhaps how our patient care fund could assist with one of those costs to free up funds for recommended testing. If that is something that they would like to do we can work with patient access team to get her scheduled. Pt mother will do so and be back in touch with me. No additional questions at this time.   HRT/VAS Care Coordination     Patients Home Cardiology Office [facility]   Outpatient Care  Team Social Worker   Social Worker Name: Makayla Owens, [place], [phone]   Living arrangements for the past 2 months Single Family Home   Lives with: Spouse; Parents   Patient Current Insurance Coverage Medicaid   Patient Has Concern With Paying Medical Bills No   Does Patient Have Prescription Coverage? Yes   Home Assistive Devices/Equipment None       Social History:                                                                             SDOH Screenings   Food Insecurity: Food Insecurity Present (03/10/2024)  Housing: Low Risk  (03/10/2024)  Transportation Needs: No Transportation Needs (03/10/2024)  Utilities: Not At  Risk (03/10/2024)  Depression (PHQ2-9): Low Risk  (04/09/2023)  Recent Concern: Depression (PHQ2-9) - High Risk (01/14/2023)  Financial Resource Strain: Medium Risk (03/10/2024)  Social Connections: Unknown (04/17/2023)   Received from [facility], [facility]  Tobacco Use: High Risk (03/04/2024)  Health Literacy: Adequate Health Literacy (03/10/2024)    SDOH Interventions: Financial Resources:  Financial Strain Interventions: Other (Comment) (food resources; patient care fund)  Food Insecurity:  Food Insecurity Interventions: [employer] Provided  Housing Insecurity:  Housing Interventions: Intervention Not Indicated  Transportation:   Transportation Interventions: Intervention Not Indicated    Other Care Navigation Interventions:     Provided Pharmacy assistance resources  Pt mother denies any issues obtaining/affording medications at this time   Follow-up plan:   LCSW will mail pt information about good resources and have encouraged pt mother to speak with pt/pt spouse regarding patient care fund assistance for housing/utilities so that pt could have funds to pay for calcium scoring. Will f/u if I do not hear back from pt/pt family.

## 2024-03-13 ENCOUNTER — Telehealth (HOSPITAL_BASED_OUTPATIENT_CLINIC_OR_DEPARTMENT_OTHER): Payer: Self-pay | Admitting: Licensed Clinical Social Worker

## 2024-03-13 NOTE — Telephone Encounter (Signed)
 H&V Care Navigation CSW Progress Note  Clinical Social Worker contacted patient by phone to f/u on referral and offer for assistance through Patient Care Fund- left voicemail for pt at [phone]. Will re-attempt again next week if no call back.  Patient is participating in a Managed Medicaid Plan:  Yes- [employer]  SDOH Screenings   Food Insecurity: Food Insecurity Present (03/10/2024)  Housing: Low Risk  (03/10/2024)  Transportation Needs: No Transportation Needs (03/10/2024)  Utilities: Not At Risk (03/10/2024)  Depression (PHQ2-9): Low Risk  (04/09/2023)  Recent Concern: Depression (PHQ2-9) - High Risk (01/14/2023)  Financial Resource Strain: Medium Risk (03/10/2024)  Social Connections: Unknown (04/17/2023)   Received from [facility], [facility]  Tobacco Use: High Risk (03/04/2024)  Health Literacy: Adequate Health Literacy (03/10/2024)    Octavio Graves, MSW, LCSW Clinical Social Worker II [facility] Health Heart/Vascular Care Navigation  [phone]- work cell phone (preferred) [phone]- desk phone

## 2024-03-17 ENCOUNTER — Telehealth: Payer: Self-pay | Admitting: Licensed Clinical Social Worker

## 2024-03-17 NOTE — Telephone Encounter (Signed)
 H&V Care Navigation CSW Progress Note  Clinical Social Worker contacted patient by phone to f/u on patient care fund assistance. Pt mother had planned to reach out to pt as needed. Still had not heard back therefore left 2nd voicemail requesting return call as able.   Patient is participating in a Managed Medicaid Plan:  No, self pay only  SDOH Screenings   Food Insecurity: Food Insecurity Present (03/10/2024)  Housing: Low Risk  (03/10/2024)  Transportation Needs: No Transportation Needs (03/10/2024)  Utilities: Not At Risk (03/10/2024)  Depression (PHQ2-9): Low Risk  (04/09/2023)  Recent Concern: Depression (PHQ2-9) - High Risk (01/14/2023)  Financial Resource Strain: Medium Risk (03/10/2024)  Social Connections: Unknown (04/17/2023)   Received from [facility], [facility]  Tobacco Use: High Risk (03/04/2024)  Health Literacy: Adequate Health Literacy (03/10/2024)    Nathen Balder, MSW, LCSW Clinical Social Worker II [facility] Health Heart/Vascular Care Navigation  [phone]- work cell phone (preferred) [phone]- desk phone

## 2024-03-19 ENCOUNTER — Ambulatory Visit (HOSPITAL_COMMUNITY)
Admission: RE | Admit: 2024-03-19 | Discharge: 2024-03-19 | Disposition: A | Discharge status: Home / Self Care | Source: Ambulatory Visit | Attending: Cardiovascular Disease | Admitting: Cardiovascular Disease

## 2024-03-19 ENCOUNTER — Telehealth (HOSPITAL_BASED_OUTPATIENT_CLINIC_OR_DEPARTMENT_OTHER): Payer: Self-pay | Admitting: Licensed Clinical Social Worker

## 2024-03-19 DIAGNOSIS — R0602 Shortness of breath: Secondary | ICD-10-CM | POA: Insufficient documentation

## 2024-03-19 DIAGNOSIS — R600 Localized edema: Secondary | ICD-10-CM | POA: Insufficient documentation

## 2024-03-19 LAB — ECHOCARDIOGRAM COMPLETE
AR max vel: 2.16 cm2
AV Area VTI: 1.75 cm2
AV Area mean vel: 1.81 cm2
AV Mean grad: 3 mmHg
AV Peak grad: 5.3 mmHg
Ao pk vel: 1.15 m/s
Area-P 1/2: 3.91 cm2
Calc EF: 12.9 %
Est EF: <20
MV M vel: 3.8 m/s
MV Peak grad: 57.8 mmHg
Radius: 0.4 cm
S' Lateral: 6.7 cm
Single Plane A2C EF: 16.7 %
Single Plane A4C EF: 12.7 %

## 2024-03-19 MED ORDER — PERFLUTREN LIPID MICROSPHERE
1.0000 mL | INTRAVENOUS | Status: AC | PRN
  Administered 2024-03-19: 4 mL via INTRAVENOUS

## 2024-03-19 NOTE — Telephone Encounter (Signed)
 H&V Care Navigation CSW Progress Note  Clinical Social Worker contacted patient by phone to f/u on offer for patient care fund assistance since I havent heard back from her or her mother. I was able to get through to her this morning at [phone]. Pt inquires why I am calling her again since I spoke with her mother. I explained offer for patient care fund assistance to help free up some funds for calcium scoring test. Pt appreciative of offer but states they have paid all their bills for the month and aren't interested in assistance at this time. Encouraged her to call me next month should she discuss with household and engage in assistance. No additional questions at this time. Remain available as needed.  Patient is participating in a Managed Medicaid Plan:  [facility]  SDOH Screenings   Food Insecurity: Food Insecurity Present (03/10/2024)  Housing: Low Risk  (03/10/2024)  Transportation Needs: No Transportation Needs (03/10/2024)  Utilities: Not At Risk (03/10/2024)  Depression (PHQ2-9): Low Risk  (04/09/2023)  Recent Concern: Depression (PHQ2-9) - High Risk (01/14/2023)  Financial Resource Strain: Medium Risk (03/10/2024)  Social Connections: Unknown (04/17/2023)   Received from [facility], [facility]  Tobacco Use: High Risk (03/04/2024)  Health Literacy: Adequate Health Literacy (03/10/2024)    Nathen Balder, MSW, LCSW Clinical Social Worker II [facility] Health Heart/Vascular Care Navigation  [phone]- work cell phone (preferred) [phone]- desk phone

## 2024-03-19 NOTE — Progress Notes (Signed)
*  PRELIMINARY RESULTS* Echocardiogram 2D Echocardiogram has been performed with Definity.  Bernis Brisker 03/19/2024, 10:37 AM

## 2024-03-24 ENCOUNTER — Telehealth: Payer: Self-pay

## 2024-03-24 NOTE — Telephone Encounter (Signed)
 Pharmacy Patient Advocate Encounter   Received notification from CoverMyMeds that prior authorization for JARDIANCE  is required/requested.   Insurance verification completed.   The patient is insured through [facility] MEDICAID .   PA required; PA submitted to above mentioned insurance via CoverMyMeds Key/confirmation #/EOC [id number]. Status is pending

## 2024-03-25 NOTE — Telephone Encounter (Signed)
 Pharmacy Patient Advocate Encounter  Received notification from [facility] MEDICAID that Prior Authorization for JARDIANCE  has been APPROVED from 03/24/24 to 03/24/25   PA #/Case ID/Reference #: [id number]

## 2024-03-26 ENCOUNTER — Other Ambulatory Visit

## 2024-03-30 ENCOUNTER — Other Ambulatory Visit: Payer: Self-pay | Admitting: Cardiovascular Disease

## 2024-03-30 ENCOUNTER — Ambulatory Visit: Attending: Cardiovascular Disease | Admitting: Cardiovascular Disease

## 2024-03-30 ENCOUNTER — Encounter: Payer: Self-pay | Admitting: Cardiovascular Disease

## 2024-03-30 VITALS — BP 98/70 | HR 92 | Ht 66.0 in | Wt 137.4 lb

## 2024-03-30 DIAGNOSIS — F17200 Nicotine dependence, unspecified, uncomplicated: Secondary | ICD-10-CM | POA: Diagnosis present

## 2024-03-30 DIAGNOSIS — I447 Left bundle-branch block, unspecified: Secondary | ICD-10-CM | POA: Diagnosis present

## 2024-03-30 DIAGNOSIS — I509 Heart failure, unspecified: Secondary | ICD-10-CM

## 2024-03-30 DIAGNOSIS — Z8679 Personal history of other diseases of the circulatory system: Secondary | ICD-10-CM | POA: Diagnosis present

## 2024-03-30 DIAGNOSIS — E7800 Pure hypercholesterolemia, unspecified: Secondary | ICD-10-CM | POA: Diagnosis present

## 2024-03-30 LAB — CBC WITH DIFFERENTIAL/PLATELET

## 2024-03-30 NOTE — Patient Instructions (Addendum)
 Medication Instructions:  Your physician recommends that you continue on your current medications as directed. Please refer to the Current Medication list given to you today.  *If you need a refill on your cardiac medications before your next appointment, please call your pharmacy*  Lab Work: Your physician recommends that you have labs drawn today: BMET & CBC  If you have labs (blood work) drawn today and your tests are completely normal, you will receive your results only by: MyChart Message (if you have MyChart) OR A paper copy in the mail If you have any lab test that is abnormal or we need to change your treatment, we will call you to review the results.  Testing/Procedures: See below  Follow-Up: At [facility], you and your health needs are our priority.  As part of our continuing mission to provide you with exceptional heart care, our providers are all part of one team.  This team includes your primary Cardiologist (physician) and Advanced Practice Providers or APPs (Physician Assistants and Nurse Practitioners) who all work together to provide you with the care you need, when you need it.  Your next appointment:   4-6 week(s)  Provider:   Lauro Portal, MD    We recommend signing up for the patient portal called "MyChart".  Sign up information is provided on this After Visit Summary.  MyChart is used to connect with patients for Virtual Visits (Telemedicine).  Patients are able to view lab/test results, encounter notes, upcoming appointments, etc.  Non-urgent messages can be sent to your provider as well.   To learn more about what you can do with MyChart, go to [email or web address].   Other Instructions       Cardiac/Peripheral Catheterization   You are scheduled for a Cardiac Catheterization on Tuesday, May 6 with Dr. Arnoldo Lapping.  1. Please arrive at the [facility] (Main Entrance A) at [facility]: [address] [place], [place] [address]  at 5:30 AM (This time is 2 hour(s) before your procedure to ensure your preparation).   Free valet parking service is available. You will check in at ADMITTING. The support person will be asked to wait in the waiting room.  It is OK to have someone drop you off and come back when you are ready to be discharged.        Special note: Every effort is made to have your procedure done on time. Please understand that emergencies sometimes delay scheduled procedures.  2. Diet: Do not eat solid foods after midnight.  You may have clear liquids until 5 AM the day of the procedure.  3. Labs: You will need to have blood drawn today (4/28).  4. Medication instructions in preparation for your procedure:    Stop taking, Lasix  (Furosemide )  Tuesday, May 6,    Do not take Diabetes Med Glucophage  (Metformin ) on the day of the procedure and HOLD 48 HOURS AFTER THE PROCEDURE.  Hold Jardiance  starting on Saturday, May 3rd- to resume after your procedure   On the morning of your procedure, take Aspirin  81 mg and any morning medicines NOT listed above.  You may use sips of water .  5. Plan to go home the same day, you will only stay overnight if medically necessary. 6. You MUST have a responsible adult to drive you home. 7. An adult MUST be with you the first 24 hours after you arrive home. 8. Bring a current list of your medications, and the last time and date medication  taken. 9. Bring ID and current insurance cards. 10.Please wear clothes that are easy to get on and off and wear slip-on shoes.  Thank you for allowing us  to care for you!   -- [facility]

## 2024-03-30 NOTE — Assessment & Plan Note (Signed)
 I have talked to her about the importance of smoking cessation.

## 2024-03-30 NOTE — Progress Notes (Signed)
 03/30/2024 Makayla Owens   04/07/1961  [id number]  Primary Physician Kandis Ormond, DO Primary Cardiologist: Avanell Leigh MD Bennye Bravo, [place]  HPI:  Makayla Owens is a 63 y.o.   thin-appearing married Caucasian female mother of 1 daughter, grandmother of 3 grandchildren referred by the emergency room where she presented yesterday with heart failure.  She is accompanied by Makayla Owens, her mother.  I last saw her in the office 03/04/2024.  Her risk factors include 15 pack years tobacco abuse currently smoking 1/2 pack/day, treated hyperlipidemia and diabetes.  She has a strong family history of heart disease with both mother and brother who have had stents.  She is never had a heart attack or stroke.  She denies chest pain but has noticed increasing dyspnea and lower extreme edema over the last 3 weeks.  She presented to the emergency room yesterday with lower extremity edema and shortness of breath.  She was treated with IV furosemide  and sent home on p.o. furosemide .  Her BNP was elevated at 2785.  She does have a history of chronic left bundle branch block and a remote history of breast cancer in 2008 treated with chemotherapy.  Since I saw her 3 weeks ago she did have a 2D echocardiogram that showed severe right and left ventricular dysfunction with LV dilatation.  She is on furosemide  and has mild peripheral edema as well as significant dyspnea on exertion.  Her soft blood pressure somewhat complicates treatment with GDMT.  I am going to refer her for right and left heart cath to define her anatomy and physiology.  If she is not ischemic she will need a cardiac MRI and referral to advanced heart failure.    Current Meds  Medication Sig   citalopram  (CELEXA ) 40 MG tablet Take 1 tablet (40 mg total) by mouth daily.   empagliflozin  (JARDIANCE ) 10 MG TABS tablet Take 1 tablet (10 mg total) by mouth daily.   furosemide  (LASIX ) 20 MG tablet Take 1 tablet (20 mg total) by mouth  daily.   ibuprofen  (ADVIL ) 200 MG tablet Take 400 mg by mouth every 6 (six) hours as needed for moderate pain.   metFORMIN  (GLUCOPHAGE ) 1000 MG tablet Take 1 tablet (1,000 mg total) by mouth 2 (two) times daily with a meal.   Multiple Vitamin (MULTIVITAMIN WITH MINERALS) TABS tablet Take 1 tablet by mouth daily.   rosuvastatin  (CRESTOR ) 20 MG tablet Take 1 tablet (20 mg total) by mouth daily.     Allergies  Allergen Reactions   Latex Rash   Nitrofurantoin Itching and Rash   Elavil  [Amitriptyline ]     Felt bad and seemed to cause UTIs   Lipitor [Atorvastatin ] Nausea Only    Muscle pain and nausea   Morphine  And Codeine  Other (See Comments)    Does not like side effects, pt could not sleep until out of her system   Other     Pt does not want narcotics   Statins     Body aches   Zofran  [Ondansetron  Hcl] Nausea And Vomiting    Social History   Socioeconomic History   Marital status: Married    Spouse name: Not on file   Number of children: Not on file   Years of education: Not on file   Highest education level: Not on file  Occupational History   Not on file  Tobacco Use   Smoking status: Every Day    Current packs/day: 0.25  Average packs/day: 0.3 packs/day for 17.0 years (4.3 ttl pk-yrs)    Types: Cigarettes   Smokeless tobacco: Never  Vaping Use   Vaping status: Never Used  Substance and Sexual Activity   Alcohol use: No    Alcohol/week: 0.0 standard drinks of alcohol   Drug use: No   Sexual activity: Yes  Other Topics Concern   Not on file  Social History Narrative   Not on file   Social Drivers of Health   Financial Resource Strain: Medium Risk (03/10/2024)   Overall Financial Resource Strain (CARDIA)    Difficulty of Paying Living Expenses: Somewhat hard  Food Insecurity: Food Insecurity Present (03/10/2024)   Hunger Vital Sign    Worried About Running Out of Food in the Last Year: Sometimes true    Ran Out of Food in the Last Year: Sometimes true   Transportation Needs: No Transportation Needs (03/10/2024)   PRAPARE - Administrator, Civil Service (Medical): No    Lack of Transportation (Non-Medical): No  Physical Activity: Not on file  Stress: Not on file  Social Connections: Unknown (04/17/2023)   Received from [facility], [facility]   Social Network    Social Network: Not on file  Intimate Partner Violence: Unknown (04/17/2023)   Received from [facility], [facility]   HITS    Physically Hurt: Not on file    Insult or Talk Down To: Not on file    Threaten Physical Harm: Not on file    Scream or Curse: Not on file     Review of Systems: General: negative for chills, fever, night sweats or weight changes.  Cardiovascular: negative for chest pain, dyspnea on exertion, edema, orthopnea, palpitations, paroxysmal nocturnal dyspnea or shortness of breath Dermatological: negative for rash Respiratory: negative for cough or wheezing Urologic: negative for hematuria Abdominal: negative for nausea, vomiting, diarrhea, bright red blood per rectum, melena, or hematemesis Neurologic: negative for visual changes, syncope, or dizziness All other systems reviewed and are otherwise negative except as noted above.    Blood pressure 98/70, pulse 92, height 5\' 6"  (1.676 m), weight 137 lb 6.4 oz (62.3 kg), SpO2 95%.  General appearance: alert and no distress Neck: no adenopathy, no carotid bruit, no JVD, supple, symmetrical, trachea midline, and thyroid  not enlarged, symmetric, no tenderness/mass/nodules Lungs: clear to auscultation bilaterally Heart: regular rate and rhythm, S1, S2 normal, no murmur, click, rub or gallop Extremities: 1+ peripheral edema Pulses: 2+ and symmetric Skin: Skin color, texture, turgor normal. No rashes or lesions Neurologic: Grossly normal  EKG EKG Interpretation Date/Time:  Monday March 30 2024 10:20:47 EDT Ventricular Rate:  92 PR Interval:  154 QRS Duration:  164 QT  Interval:  430 QTC Calculation: 531 R Axis:   -55  Text Interpretation: Normal sinus rhythm Left atrial enlargement Left bundle branch block When compared with ECG of 03-Mar-2024 01:28, PREVIOUS ECG IS PRESENT Confirmed by Lauro Portal [phone]) on 03/30/2024 10:23:33 AM    ASSESSMENT AND PLAN:   HYPERCHOLESTEROLEMIA History of hyperlipidemia on statin therapy with lipid profile performed 08/26/2023 revealing total cholesterol 131, LDL of 54 and HDL of 60.  Congestive heart failure (HCC) Ms. Puchalski presented to the ER 03/03/24 with heart failure.  Her BNP was elevated at 2785.  She was given IV furosemide  and came to see me on 03/04/24.  A 2D echocardiogram performed/17/25 showed biventricular severe dysfunction with left ventricular dilatation.  Etiology is still unclear.  She is on IV furosemide  but does  have dyspnea on exertion.Aaron Aas  Her blood pressure is low at 98/70 which somewhat precludes the addition of GDMT.  She does have left bundle branch block.  I am going to arrange for her to undergo right left heart cath next week.  If she has ischemic cardiomyopathy we will base our treatment strategy on her anatomy.  If she has nonischemic cardiomyopathy she will need cardiac MRI, and referral to the advanced heart failure clinic for GDMT.  She also may benefit from referral to EP for ICD implantation and CRT/biventricular pacing.  Tobacco use disorder I have talked to her about the importance of smoking cessation.  Left bundle branch block Chronic     Avanell Leigh MD [facility], [facility] 03/30/2024 10:41 AM

## 2024-03-30 NOTE — Assessment & Plan Note (Signed)
 Ms. Bier presented to the ER 03/03/24 with heart failure.  Her BNP was elevated at 2785.  She was given IV furosemide  and came to see me on 03/04/24.  A 2D echocardiogram performed/17/25 showed biventricular severe dysfunction with left ventricular dilatation.  Etiology is still unclear.  She is on IV furosemide  but does have dyspnea on exertion.Aaron Aas  Her blood pressure is low at 98/70 which somewhat precludes the addition of GDMT.  She does have left bundle branch block.  I am going to arrange for her to undergo right left heart cath next week.  If she has ischemic cardiomyopathy we will base our treatment strategy on her anatomy.  If she has nonischemic cardiomyopathy she will need cardiac MRI, and referral to the advanced heart failure clinic for GDMT.  She also may benefit from referral to EP for ICD implantation and CRT/biventricular pacing.

## 2024-03-30 NOTE — H&P (View-Only) (Signed)
 03/30/2024 Makayla Owens   04/07/1961  [id number]  Primary Physician Kandis Ormond, DO Primary Cardiologist: Avanell Leigh MD Bennye Bravo, [place]  HPI:  Makayla Owens is a 63 y.o.   thin-appearing married Caucasian female mother of 1 daughter, grandmother of 3 grandchildren referred by the emergency room where she presented yesterday with heart failure.  She is accompanied by [name], her mother.  I last saw her in the office 03/04/2024.  Her risk factors include 15 pack years tobacco abuse currently smoking 1/2 pack/day, treated hyperlipidemia and diabetes.  She has a strong family history of heart disease with both mother and brother who have had stents.  She is never had a heart attack or stroke.  She denies chest pain but has noticed increasing dyspnea and lower extreme edema over the last 3 weeks.  She presented to the emergency room yesterday with lower extremity edema and shortness of breath.  She was treated with IV furosemide  and sent home on p.o. furosemide .  Her BNP was elevated at 2785.  She does have a history of chronic left bundle branch block and a remote history of breast cancer in 2008 treated with chemotherapy.  Since I saw her 3 weeks ago she did have a 2D echocardiogram that showed severe right and left ventricular dysfunction with LV dilatation.  She is on furosemide  and has mild peripheral edema as well as significant dyspnea on exertion.  Her soft blood pressure somewhat complicates treatment with GDMT.  I am going to refer her for right and left heart cath to define her anatomy and physiology.  If she is not ischemic she will need a cardiac MRI and referral to advanced heart failure.    Current Meds  Medication Sig   citalopram  (CELEXA ) 40 MG tablet Take 1 tablet (40 mg total) by mouth daily.   empagliflozin  (JARDIANCE ) 10 MG TABS tablet Take 1 tablet (10 mg total) by mouth daily.   furosemide  (LASIX ) 20 MG tablet Take 1 tablet (20 mg total) by mouth  daily.   ibuprofen  (ADVIL ) 200 MG tablet Take 400 mg by mouth every 6 (six) hours as needed for moderate pain.   metFORMIN  (GLUCOPHAGE ) 1000 MG tablet Take 1 tablet (1,000 mg total) by mouth 2 (two) times daily with a meal.   Multiple Vitamin (MULTIVITAMIN WITH MINERALS) TABS tablet Take 1 tablet by mouth daily.   rosuvastatin  (CRESTOR ) 20 MG tablet Take 1 tablet (20 mg total) by mouth daily.     Allergies  Allergen Reactions   Latex Rash   Nitrofurantoin Itching and Rash   Elavil  [Amitriptyline ]     Felt bad and seemed to cause UTIs   Lipitor [Atorvastatin ] Nausea Only    Muscle pain and nausea   Morphine  And Codeine  Other (See Comments)    Does not like side effects, pt could not sleep until out of her system   Other     Pt does not want narcotics   Statins     Body aches   Zofran  [Ondansetron  Hcl] Nausea And Vomiting    Social History   Socioeconomic History   Marital status: Married    Spouse name: Not on file   Number of children: Not on file   Years of education: Not on file   Highest education level: Not on file  Occupational History   Not on file  Tobacco Use   Smoking status: Every Day    Current packs/day: 0.25  Average packs/day: 0.3 packs/day for 17.0 years (4.3 ttl pk-yrs)    Types: Cigarettes   Smokeless tobacco: Never  Vaping Use   Vaping status: Never Used  Substance and Sexual Activity   Alcohol use: No    Alcohol/week: 0.0 standard drinks of alcohol   Drug use: No   Sexual activity: Yes  Other Topics Concern   Not on file  Social History Narrative   Not on file   Social Drivers of Health   Financial Resource Strain: Medium Risk (03/10/2024)   Overall Financial Resource Strain (CARDIA)    Difficulty of Paying Living Expenses: Somewhat hard  Food Insecurity: Food Insecurity Present (03/10/2024)   Hunger Vital Sign    Worried About Running Out of Food in the Last Year: Sometimes true    Ran Out of Food in the Last Year: Sometimes true   Transportation Needs: No Transportation Needs (03/10/2024)   PRAPARE - Administrator, Civil Service (Medical): No    Lack of Transportation (Non-Medical): No  Physical Activity: Not on file  Stress: Not on file  Social Connections: Unknown (04/17/2023)   Received from [facility], [facility]   Social Network    Social Network: Not on file  Intimate Partner Violence: Unknown (04/17/2023)   Received from [facility], [facility]   HITS    Physically Hurt: Not on file    Insult or Talk Down To: Not on file    Threaten Physical Harm: Not on file    Scream or Curse: Not on file     Review of Systems: General: negative for chills, fever, night sweats or weight changes.  Cardiovascular: negative for chest pain, dyspnea on exertion, edema, orthopnea, palpitations, paroxysmal nocturnal dyspnea or shortness of breath Dermatological: negative for rash Respiratory: negative for cough or wheezing Urologic: negative for hematuria Abdominal: negative for nausea, vomiting, diarrhea, bright red blood per rectum, melena, or hematemesis Neurologic: negative for visual changes, syncope, or dizziness All other systems reviewed and are otherwise negative except as noted above.    Blood pressure 98/70, pulse 92, height 5\' 6"  (1.676 m), weight 137 lb 6.4 oz (62.3 kg), SpO2 95%.  General appearance: alert and no distress Neck: no adenopathy, no carotid bruit, no JVD, supple, symmetrical, trachea midline, and thyroid  not enlarged, symmetric, no tenderness/mass/nodules Lungs: clear to auscultation bilaterally Heart: regular rate and rhythm, S1, S2 normal, no murmur, click, rub or gallop Extremities: 1+ peripheral edema Pulses: 2+ and symmetric Skin: Skin color, texture, turgor normal. No rashes or lesions Neurologic: Grossly normal  EKG EKG Interpretation Date/Time:  Monday March 30 2024 10:20:47 EDT Ventricular Rate:  92 PR Interval:  154 QRS Duration:  164 QT  Interval:  430 QTC Calculation: 531 R Axis:   -55  Text Interpretation: Normal sinus rhythm Left atrial enlargement Left bundle branch block When compared with ECG of 03-Mar-2024 01:28, PREVIOUS ECG IS PRESENT Confirmed by Lauro Portal [phone]) on 03/30/2024 10:23:33 AM    ASSESSMENT AND PLAN:   HYPERCHOLESTEROLEMIA History of hyperlipidemia on statin therapy with lipid profile performed 08/26/2023 revealing total cholesterol 131, LDL of 54 and HDL of 60.  Congestive heart failure (HCC) Ms. Puchalski presented to the ER 03/03/24 with heart failure.  Her BNP was elevated at 2785.  She was given IV furosemide  and came to see me on 03/04/24.  A 2D echocardiogram performed/17/25 showed biventricular severe dysfunction with left ventricular dilatation.  Etiology is still unclear.  She is on IV furosemide  but does  have dyspnea on exertion.Aaron Aas  Her blood pressure is low at 98/70 which somewhat precludes the addition of GDMT.  She does have left bundle branch block.  I am going to arrange for her to undergo right left heart cath next week.  If she has ischemic cardiomyopathy we will base our treatment strategy on her anatomy.  If she has nonischemic cardiomyopathy she will need cardiac MRI, and referral to the advanced heart failure clinic for GDMT.  She also may benefit from referral to EP for ICD implantation and CRT/biventricular pacing.  Tobacco use disorder I have talked to her about the importance of smoking cessation.  Left bundle branch block Chronic     Avanell Leigh MD [facility], [facility] 03/30/2024 10:41 AM

## 2024-03-30 NOTE — Assessment & Plan Note (Signed)
 Chronic.

## 2024-03-30 NOTE — Assessment & Plan Note (Signed)
 History of hyperlipidemia on statin therapy with lipid profile performed 08/26/2023 revealing total cholesterol 131, LDL of 54 and HDL of 60.

## 2024-03-31 LAB — CBC WITH DIFFERENTIAL/PLATELET
Basos: 1 %
EOS (ABSOLUTE): 0.1 10*3/uL (ref 0.0–0.2)
Eos: 0 %
Hematocrit: 48.2 % — ABNORMAL HIGH (ref 34.0–46.6)
Hemoglobin: 16 g/dL — ABNORMAL HIGH (ref 11.1–15.9)
Immature Granulocytes: 0 %
Immature Granulocytes: 0 10*3/uL (ref 0.0–0.1)
Lymphs: 24 %
MCH: 30.4 pg (ref 26.6–33.0)
MCHC: 33.2 g/dL (ref 31.5–35.7)
MCV: 92 fL (ref 79–97)
Monocytes Absolute: 0 10*3/uL (ref 0.0–0.4)
Monocytes Absolute: 0.6 10*3/uL (ref 0.1–0.9)
Monocytes: 5 %
Neutrophils Absolute: 2.7 10*3/uL (ref 0.7–3.1)
Neutrophils Absolute: 7.6 10*3/uL — ABNORMAL HIGH (ref 1.4–7.0)
Neutrophils: 70 %
Platelets: 243 10*3/uL (ref 150–450)
RBC: 5.27 x10E6/uL (ref 3.77–5.28)
RDW: 14.1 % (ref 11.7–15.4)
WBC: 11 10*3/uL — ABNORMAL HIGH (ref 3.4–10.8)

## 2024-03-31 LAB — BASIC METABOLIC PANEL WITH GFR
BUN/Creatinine Ratio: 20 (ref 12–28)
BUN: 12 mg/dL (ref 8–27)
CO2: 22 mmol/L (ref 20–29)
Calcium: 9.5 mg/dL (ref 8.7–10.3)
Chloride: 95 mmol/L — ABNORMAL LOW (ref 96–106)
Creatinine, Ser: 0.59 mg/dL (ref 0.57–1.00)
Glucose: 141 mg/dL — ABNORMAL HIGH (ref 70–99)
Potassium: 5.4 mmol/L — ABNORMAL HIGH (ref 3.5–5.2)
Sodium: 132 mmol/L — ABNORMAL LOW (ref 134–144)
eGFR: 103 mL/min/{1.73_m2} (ref >59)

## 2024-04-06 ENCOUNTER — Telehealth: Payer: Self-pay | Admitting: *Deleted

## 2024-04-06 NOTE — Telephone Encounter (Signed)
 Cardiac Catheterization scheduled at [facility] for: Tuesday Apr 07, 2024 7:30 AM Arrival time [facility] Main Entrance A at: 5:30 AM  Nothing to eat after midnight prior to procedure, clear liquids until 5 AM day of procedure.  Medication instructions: -Hold:  Lasix -AM of procedure  Metformin -day of procedure and 48 hours post procedure  Jardiance  -AM of procedure -Other usual morning medications can be taken with sips of water  including aspirin  81 mg.  Plan to go home the same day, you will only stay overnight if medically necessary.  You must have responsible adult to drive you home.  Someone must be with you the first 24 hours after you arrive home.  Reviewed procedure instructions with patient.

## 2024-04-07 ENCOUNTER — Ambulatory Visit (HOSPITAL_COMMUNITY)
Admission: RE | Admit: 2024-04-07 | Discharge: 2024-04-07 | Disposition: A | Discharge status: Home / Self Care | Attending: Cardiovascular Disease | Admitting: Cardiovascular Disease

## 2024-04-07 ENCOUNTER — Encounter: Payer: Self-pay | Admitting: Family Medicine

## 2024-04-07 ENCOUNTER — Encounter (HOSPITAL_COMMUNITY)
Admission: RE | Disposition: A | Discharge status: Home / Self Care | Payer: Self-pay | Source: Home / Self Care | Attending: Cardiovascular Disease

## 2024-04-07 ENCOUNTER — Other Ambulatory Visit: Payer: Self-pay

## 2024-04-07 DIAGNOSIS — Z8249 Family history of ischemic heart disease and other diseases of the circulatory system: Secondary | ICD-10-CM | POA: Insufficient documentation

## 2024-04-07 DIAGNOSIS — Z9221 Personal history of antineoplastic chemotherapy: Secondary | ICD-10-CM | POA: Diagnosis not present

## 2024-04-07 DIAGNOSIS — E119 Type 2 diabetes mellitus without complications: Secondary | ICD-10-CM | POA: Insufficient documentation

## 2024-04-07 DIAGNOSIS — Z79899 Other long term (current) drug therapy: Secondary | ICD-10-CM | POA: Insufficient documentation

## 2024-04-07 DIAGNOSIS — I5022 Chronic systolic (congestive) heart failure: Secondary | ICD-10-CM | POA: Diagnosis present

## 2024-04-07 DIAGNOSIS — E7800 Pure hypercholesterolemia, unspecified: Secondary | ICD-10-CM | POA: Insufficient documentation

## 2024-04-07 DIAGNOSIS — I447 Left bundle-branch block, unspecified: Secondary | ICD-10-CM | POA: Diagnosis not present

## 2024-04-07 DIAGNOSIS — F17210 Nicotine dependence, cigarettes, uncomplicated: Secondary | ICD-10-CM | POA: Insufficient documentation

## 2024-04-07 DIAGNOSIS — I2510 Atherosclerotic heart disease of native coronary artery without angina pectoris: Secondary | ICD-10-CM | POA: Diagnosis not present

## 2024-04-07 DIAGNOSIS — Z7984 Long term (current) use of oral hypoglycemic drugs: Secondary | ICD-10-CM | POA: Insufficient documentation

## 2024-04-07 DIAGNOSIS — I2584 Coronary atherosclerosis due to calcified coronary lesion: Secondary | ICD-10-CM | POA: Insufficient documentation

## 2024-04-07 DIAGNOSIS — I272 Pulmonary hypertension, unspecified: Secondary | ICD-10-CM | POA: Diagnosis not present

## 2024-04-07 DIAGNOSIS — Z853 Personal history of malignant neoplasm of breast: Secondary | ICD-10-CM | POA: Insufficient documentation

## 2024-04-07 DIAGNOSIS — I509 Heart failure, unspecified: Secondary | ICD-10-CM

## 2024-04-07 DIAGNOSIS — I5020 Unspecified systolic (congestive) heart failure: Secondary | ICD-10-CM | POA: Diagnosis not present

## 2024-04-07 LAB — POCT I-STAT EG7
Acid-base deficit: 2 mmol/L (ref 0.0–2.0)
Bicarbonate: 23.2 mmol/L (ref 20.0–28.0)
Calcium, Ion: 1.19 mmol/L (ref 1.15–1.40)
HCT: 41 % (ref 36.0–46.0)
Hemoglobin: 13.9 g/dL (ref 12.0–15.0)
O2 Saturation: 59 %
Potassium: 4.2 mmol/L (ref 3.5–5.1)
Sodium: 136 mmol/L (ref 135–145)
TCO2: 24 mmol/L (ref 22–32)
pCO2, Ven: 41.2 mmHg — ABNORMAL LOW (ref 44–60)
pH, Ven: 7.359 (ref 7.25–7.43)
pO2, Ven: 32 mmHg (ref 32–45)

## 2024-04-07 LAB — POCT I-STAT 7, (LYTES, BLD GAS, ICA,H+H)
Acid-base deficit: 6 mmol/L — ABNORMAL HIGH (ref 0.0–2.0)
Bicarbonate: 18.7 mmol/L — ABNORMAL LOW (ref 20.0–28.0)
Calcium, Ion: 1.18 mmol/L (ref 1.15–1.40)
HCT: 40 % (ref 36.0–46.0)
Hemoglobin: 13.6 g/dL (ref 12.0–15.0)
O2 Saturation: 89 %
Potassium: 3.9 mmol/L (ref 3.5–5.1)
Sodium: 130 mmol/L — ABNORMAL LOW (ref 135–145)
TCO2: 20 mmol/L — ABNORMAL LOW (ref 22–32)
pCO2 arterial: 35.4 mmHg (ref 32–48)
pH, Arterial: 7.332 — ABNORMAL LOW (ref 7.35–7.45)
pO2, Arterial: 61 mmHg — ABNORMAL LOW (ref 83–108)

## 2024-04-07 LAB — GLUCOSE, CAPILLARY
Glucose-Capillary: 112 mg/dL — ABNORMAL HIGH (ref 70–99)
Glucose-Capillary: 105 mg/dL — ABNORMAL HIGH (ref 70–99)

## 2024-04-07 SURGERY — RIGHT/LEFT HEART CATH AND CORONARY ANGIOGRAPHY
Anesthesia: LOCAL

## 2024-04-07 MED ORDER — FENTANYL CITRATE (PF) 100 MCG/2ML IJ SOLN
INTRAMUSCULAR | Status: AC
Start: 2024-04-07 — End: ?
  Filled 2024-04-07: qty 2

## 2024-04-07 MED ORDER — ACETAMINOPHEN 325 MG PO TABS: 650.0000 mg | ORAL_TABLET | ORAL | Status: DC | PRN

## 2024-04-07 MED ORDER — HEPARIN SODIUM (PORCINE) 1000 UNIT/ML IJ SOLN
INTRAMUSCULAR | Status: DC | PRN
  Administered 2024-04-07: 4000 [IU] via INTRAVENOUS

## 2024-04-07 MED ORDER — SODIUM CHLORIDE 0.9% FLUSH: 3.0000 mL | INTRAVENOUS | Status: DC | PRN

## 2024-04-07 MED ORDER — HYDRALAZINE HCL 20 MG/ML IJ SOLN: 10.0000 mg | INTRAMUSCULAR | Status: DC | PRN

## 2024-04-07 MED ORDER — LABETALOL HCL 5 MG/ML IV SOLN: 10.0000 mg | INTRAVENOUS | Status: DC | PRN

## 2024-04-07 MED ORDER — HEPARIN (PORCINE) IN NACL 2000-0.9 UNIT/L-% IV SOLN
INTRAVENOUS | Status: DC | PRN
  Administered 2024-04-07: 1000 mL

## 2024-04-07 MED ORDER — SODIUM CHLORIDE 0.9 % IV SOLN: 250.0000 mL | INTRAVENOUS | Status: DC | PRN

## 2024-04-07 MED ORDER — MIDAZOLAM HCL 2 MG/2ML IJ SOLN
INTRAMUSCULAR | Status: DC | PRN
  Administered 2024-04-07: 2 mg via INTRAVENOUS

## 2024-04-07 MED ORDER — SODIUM CHLORIDE 0.9% FLUSH: 3.0000 mL | Freq: Two times a day (BID) | INTRAVENOUS | Status: DC

## 2024-04-07 MED ORDER — FENTANYL CITRATE (PF) 100 MCG/2ML IJ SOLN
INTRAMUSCULAR | Status: DC | PRN
  Administered 2024-04-07: 25 ug via INTRAVENOUS

## 2024-04-07 MED ORDER — VERAPAMIL HCL 2.5 MG/ML IV SOLN
INTRAVENOUS | Status: AC
  Filled 2024-04-07: qty 2

## 2024-04-07 MED ORDER — IOHEXOL 350 MG/ML SOLN
INTRAVENOUS | Status: DC | PRN
  Administered 2024-04-07: 40 mL

## 2024-04-07 MED ORDER — FUROSEMIDE 20 MG PO TABS: 40.0000 mg | ORAL_TABLET | Freq: Every day | ORAL | 1 refills | Status: DC

## 2024-04-07 MED ORDER — MIDAZOLAM HCL 2 MG/2ML IJ SOLN
INTRAMUSCULAR | Status: AC
  Filled 2024-04-07: qty 2

## 2024-04-07 MED ORDER — LIDOCAINE HCL (PF) 1 % IJ SOLN
INTRAMUSCULAR | Status: DC | PRN
  Administered 2024-04-07: 5 mL via INTRADERMAL

## 2024-04-07 MED ORDER — ASPIRIN 81 MG PO TBEC: 81.0000 mg | DELAYED_RELEASE_TABLET | Freq: Every day | ORAL | Status: DC

## 2024-04-07 MED ORDER — LIDOCAINE HCL (PF) 1 % IJ SOLN
INTRAMUSCULAR | Status: AC
  Filled 2024-04-07: qty 30

## 2024-04-07 MED ORDER — VERAPAMIL HCL 2.5 MG/ML IV SOLN
INTRAVENOUS | Status: DC | PRN
  Administered 2024-04-07: 10 mL via INTRA_ARTERIAL

## 2024-04-07 MED ORDER — SODIUM CHLORIDE 0.9 % IV SOLN: INTRAVENOUS | Status: DC

## 2024-04-07 SURGICAL SUPPLY — 13 items
CATH 5FR JL3.5 JR4 ANG PIG MP (CATHETERS) IMPLANT
CATH BALLN WEDGE 5F 110CM (CATHETERS) IMPLANT
DEVICE RAD COMP TR BAND LRG (VASCULAR PRODUCTS) IMPLANT
ELECT DEFIB PAD ADLT CADENCE (PAD) IMPLANT
GLIDESHEATH SLEND SS 6F .021 (SHEATH) IMPLANT
GUIDEWIRE .025 260CM (WIRE) IMPLANT
GUIDEWIRE INQWIRE 1.5J.035X260 (WIRE) IMPLANT
KIT SYRINGE INJ CVI SPIKEX1 (MISCELLANEOUS) IMPLANT
PACK CARDIAC CATHETERIZATION (CUSTOM PROCEDURE TRAY) ×1 IMPLANT
SET ATX-X65L (MISCELLANEOUS) IMPLANT
SHEATH GLIDE SLENDER 4/5FR (SHEATH) IMPLANT
SHEATH PROBE COVER 6X72 (BAG) IMPLANT
WIRE EMERALD 3MM-J .025X260CM (WIRE) IMPLANT

## 2024-04-07 NOTE — Interval H&P Note (Signed)
 History and Physical Interval Note:  04/07/2024 7:43 AM  Makayla Owens  has presented today for surgery, with the diagnosis of heart failure.  The various methods of treatment have been discussed with the patient and family. After consideration of risks, benefits and other options for treatment, the patient has consented to  Procedure(s): RIGHT/LEFT HEART CATH AND CORONARY ANGIOGRAPHY (N/A) as a surgical intervention.  The patient's history has been reviewed, patient examined, no change in status, stable for surgery.  I have reviewed the patient's chart and labs.  Questions were answered to the patient's satisfaction.     Arnoldo Lapping

## 2024-04-07 NOTE — Discharge Instructions (Signed)

## 2024-04-07 NOTE — Progress Notes (Signed)
 TR BAND REMOVAL  LOCATION:    right radial  DEFLATED PER PROTOCOL:    Yes.    TIME BAND OFF / DRESSING APPLIED:    1020 gauze dressing applied.    SITE UPON ARRIVAL:    Level 0  SITE AFTER BAND REMOVAL:    Level 0  CIRCULATION SENSATION AND MOVEMENT:    Within Normal Limits   Yes.    COMMENTS:   site bled after TR band removed, pressure held at site for total of 20 minutes, PA notified and at bedside. Bleed stopped gauze dressing reapplied

## 2024-04-08 ENCOUNTER — Encounter (HOSPITAL_COMMUNITY): Payer: Self-pay | Admitting: Cardiovascular Disease

## 2024-04-16 ENCOUNTER — Telehealth (HOSPITAL_COMMUNITY): Payer: Self-pay | Admitting: Cardiology

## 2024-04-16 NOTE — Telephone Encounter (Signed)
 Called to confirm/remind patient of their appointment at the [facility] on 04/16/2024.   Appointment:   [x] Confirmed  [] Left mess   [] No answer/No voice mail  [] VM Full/unable to leave message  [] Phone not in service  Patient reminded to bring all medications and/or complete list.  Confirmed patient has transportation. Gave directions, instructed to utilize valet parking.

## 2024-04-17 ENCOUNTER — Encounter (HOSPITAL_COMMUNITY): Payer: Self-pay | Admitting: Cardiology

## 2024-04-17 ENCOUNTER — Ambulatory Visit (HOSPITAL_COMMUNITY)
Admission: RE | Admit: 2024-04-17 | Discharge: 2024-04-17 | Disposition: A | Discharge status: Home / Self Care | Source: Ambulatory Visit | Attending: Adult Health | Admitting: Adult Health

## 2024-04-17 ENCOUNTER — Ambulatory Visit (HOSPITAL_COMMUNITY): Payer: Self-pay | Admitting: Adult Health

## 2024-04-17 VITALS — BP 100/64 | HR 96 | Wt 139.8 lb

## 2024-04-17 DIAGNOSIS — I447 Left bundle-branch block, unspecified: Secondary | ICD-10-CM | POA: Diagnosis not present

## 2024-04-17 DIAGNOSIS — I2510 Atherosclerotic heart disease of native coronary artery without angina pectoris: Secondary | ICD-10-CM | POA: Diagnosis not present

## 2024-04-17 DIAGNOSIS — Z7984 Long term (current) use of oral hypoglycemic drugs: Secondary | ICD-10-CM | POA: Diagnosis not present

## 2024-04-17 DIAGNOSIS — I5022 Chronic systolic (congestive) heart failure: Secondary | ICD-10-CM | POA: Diagnosis present

## 2024-04-17 DIAGNOSIS — Z853 Personal history of malignant neoplasm of breast: Secondary | ICD-10-CM | POA: Diagnosis not present

## 2024-04-17 DIAGNOSIS — Z79899 Other long term (current) drug therapy: Secondary | ICD-10-CM | POA: Diagnosis not present

## 2024-04-17 DIAGNOSIS — R5383 Other fatigue: Secondary | ICD-10-CM | POA: Insufficient documentation

## 2024-04-17 DIAGNOSIS — Z8679 Personal history of other diseases of the circulatory system: Secondary | ICD-10-CM

## 2024-04-17 DIAGNOSIS — R0602 Shortness of breath: Secondary | ICD-10-CM | POA: Insufficient documentation

## 2024-04-17 DIAGNOSIS — Z5986 Financial insecurity: Secondary | ICD-10-CM | POA: Insufficient documentation

## 2024-04-17 DIAGNOSIS — F17210 Nicotine dependence, cigarettes, uncomplicated: Secondary | ICD-10-CM | POA: Insufficient documentation

## 2024-04-17 DIAGNOSIS — I5082 Biventricular heart failure: Secondary | ICD-10-CM | POA: Diagnosis not present

## 2024-04-17 DIAGNOSIS — F17200 Nicotine dependence, unspecified, uncomplicated: Secondary | ICD-10-CM

## 2024-04-17 DIAGNOSIS — R600 Localized edema: Secondary | ICD-10-CM | POA: Diagnosis not present

## 2024-04-17 DIAGNOSIS — E875 Hyperkalemia: Secondary | ICD-10-CM | POA: Insufficient documentation

## 2024-04-17 LAB — CBC
HCT: 45.8 % (ref 36.0–46.0)
Hemoglobin: 15.4 g/dL — ABNORMAL HIGH (ref 12.0–15.0)
MCH: 29.9 pg (ref 26.0–34.0)
MCHC: 33.6 g/dL (ref 30.0–36.0)
MCV: 88.9 fL (ref 80.0–100.0)
Platelets: 216 10*3/uL (ref 150–400)
RBC: 5.15 MIL/uL — ABNORMAL HIGH (ref 3.87–5.11)
RDW: 15.4 % (ref 11.5–15.5)
WBC: 9.1 10*3/uL (ref 4.0–10.5)
nRBC: 0 % (ref 0.0–0.2)

## 2024-04-17 LAB — BASIC METABOLIC PANEL WITH GFR
Anion gap: 10 (ref 5–15)
BUN: 13 mg/dL (ref 6–20)
CO2: 27 mmol/L (ref 22–32)
Calcium: 9.1 mg/dL (ref 8.9–10.3)
Chloride: 98 mmol/L (ref 98–111)
Creatinine, Ser: 0.64 mg/dL (ref 0.44–1.00)
GFR, Estimated: >60 mL/min (ref >60)
Glucose, Bld: 144 mg/dL — ABNORMAL HIGH (ref 70–99)
Potassium: 4.6 mmol/L (ref 3.5–5.1)
Sodium: 135 mmol/L (ref 135–145)

## 2024-04-17 LAB — BRAIN NATRIURETIC PEPTIDE: B Natriuretic Peptide: 3361.5 pg/mL — ABNORMAL HIGH (ref 0.0–100.0)

## 2024-04-17 MED ORDER — DIGOXIN 125 MCG PO TABS: 0.1250 mg | ORAL_TABLET | Freq: Every day | ORAL | 3 refills | Status: AC

## 2024-04-17 NOTE — Addendum Note (Signed)
 Encounter addended by: Remigio Carl, NP on: 04/17/2024 4:06 PM  Actions taken: Level of Service modified

## 2024-04-17 NOTE — Progress Notes (Signed)
 ADVANCED HF CLINIC CONSULT NOTE  Referring Physician: Kandis Ormond, DO Primary Care: Kandis Ormond, DO Primary Cardiologist:  HF MD: Dr Bruce Caper  Chief Complaint: Heart Failure  HPI: Makayla Owens is referred to the HF clinic by Dr Arlester Ladd for heart failure consultation.   Makayla Owens is a 63 year old with a history of tobacco abuse, HFrEF, CAD 1 vessel disease, HLD, LBBB, breast cancer 2008 completed aggressive chemo in [place]  she thinks it was Adriamycin, former smoker. FH- Mom and  brother with extensive coronary disease.   She first noticed shortness of breath and leg edema in March of this year. She was seen in the ED 03/03/2024 for leg edema. BNP > 2000. Offered admit but she went home and got an outpatient Echo.  Referred to cardiology. Strong family history of CAD sister/brother  and tobacco abuse. Echo - Biventricular HF EF < 20% RV severely reduced. She was set up for cath. Cath with single vessel CAD LAD 75-80% out of proportion to extent of heart failure. Medical management was recommended.    She is here today with her Mother. Complaining of shortness of breath , fatigue, and leg edema. Denies PND/Orthopnea. Appetite ok. Eats lots of tomatoes. Eats saltines, pork rhines, and pretzels. Drinks lots fluids. No fever or chills. Taking all medications. Lives with his her husband. Smoking 3-4 cigarettes per day.   Cardiac Test Reviewed Echo 03/2024 LVEF 20% RV severely reduced.   Cath 04/07/24    Severe single-vessel coronary artery disease with 75 to 80% mid LAD stenosis just after a large first diagonal branch. 2.  Mild nonobstructive plaquing in the left circumflex and RCA 3.  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  Degree of heart failure and LV dilatation seems out of proportion to extent of CAD.  Favor medical therapy for CAD, and agree with plans to send patient for cardiac MRI and advanced heart failure consultation. Past Medical History:  Diagnosis Date   Breast cancer (HCC)    Chronic kidney disease    Depression    Diabetes mellitus    Gall bladder stones    Migraines     Current Outpatient Medications  Medication Sig Dispense Refill   citalopram  (CELEXA ) 40 MG tablet Take 1 tablet (40 mg total) by mouth daily. 90 tablet 3   empagliflozin  (JARDIANCE ) 10 MG TABS tablet Take 1 tablet (10 mg total) by mouth daily. 90 tablet 3   furosemide  (LASIX ) 20 MG tablet Take 2 tablets (40 mg total) by mouth daily. 30 tablet 1   ibuprofen  (ADVIL ) 200 MG tablet Take 800 mg by mouth 2 (two) times daily.     metFORMIN  (GLUCOPHAGE ) 1000 MG tablet Take 1 tablet (1,000 mg total) by mouth 2 (two) times daily with a meal. 180 tablet 3   rosuvastatin  (CRESTOR ) 20 MG tablet Take 1 tablet (20 mg total) by mouth daily. 90 tablet 3   No current facility-administered medications for this encounter.    Allergies  Allergen Reactions   Latex Rash   Nitrofurantoin Itching and Rash   Elavil  [Amitriptyline ]  Felt bad and seemed to cause UTIs   Lipitor [Atorvastatin ] Nausea Only    Muscle pain and nausea   Morphine  And Codeine  Other (See Comments)    Does not like side effects, pt could not sleep until out of her system   Other     Pt does not want narcotics   Statins     Body aches   Zofran  [Ondansetron  Hcl] Nausea And Vomiting      Social History   Socioeconomic History   Marital status: Married    Spouse name: Not on file   Number of children: Not on file   Years of education: Not on file   Highest education level: Not on file   Occupational History   Not on file  Tobacco Use   Smoking status: Every Day    Current packs/day: 0.25    Average packs/day: 0.3 packs/day for 17.0 years (4.3 ttl pk-yrs)    Types: Cigarettes   Smokeless tobacco: Never  Vaping Use   Vaping status: Never Used  Substance and Sexual Activity   Alcohol use: No    Alcohol/week: 0.0 standard drinks of alcohol   Drug use: No   Sexual activity: Yes  Other Topics Concern   Not on file  Social History Narrative   Not on file   Social Drivers of Health   Financial Resource Strain: Medium Risk (03/10/2024)   Overall Financial Resource Strain (CARDIA)    Difficulty of Paying Living Expenses: Somewhat hard  Food Insecurity: Food Insecurity Present (03/10/2024)   Hunger Vital Sign    Worried About Running Out of Food in the Last Year: Sometimes true    Ran Out of Food in the Last Year: Sometimes true  Transportation Needs: No Transportation Needs (03/10/2024)   PRAPARE - Administrator, Civil Service (Medical): No    Lack of Transportation (Non-Medical): No  Physical Activity: Not on file  Stress: Not on file  Social Connections: Unknown (04/17/2023)   Received from [facility]   Social Network    Social Network: Not on file  Intimate Partner Violence: Unknown (04/17/2023)   Received from [facility]   HITS    Physically Hurt: Not on file    Insult or Talk Down To: Not on file    Threaten Physical Harm: Not on file    Scream or Curse: Not on file      Family History  Problem Relation Age of Onset   Cancer Mother    Heart failure Mother    Diabetes Father    Heart failure Father    Cancer Father    Stroke Father     Vitals:   04/17/24 1116  BP: 100/64  Pulse: 96  SpO2: 97%  Weight: 63.4 kg (139 lb 12.8 oz)   Wt Readings from Last 3 Encounters:  04/17/24 63.4 kg (139 lb 12.8 oz)  04/07/24 62.1 kg (137 lb)  03/30/24 62.3 kg (137 lb 6.4 oz)    PHYSICAL EXAM: General:   No  resp difficulty Neck: supple. JVP 9-10  Cor: PMI nondisplaced. Regular rate & rhythm. No rubs, gallops or murmurs. Lungs: clear Abdomen: soft, nontender, nondistended.  Extremities: no cyanosis, clubbing, rash, R and LLE 2+ edema Neuro: alert & oriented x3  ECG: SR 93 bpm LBBB 166   ASSESSMENT & PLAN: 1. Chronic Biventricular HFrEF Recently diagnosed with biventricular heart failure. She had a cath with 1V CAD which was out of proportion to  HF. Etiology unclear. No recent virus. Possibly due to LBBB versus Chemo Induced. Had Adriamycin for breast cancer in 2008.   NYHA III. On exam she is volume overloaded. Suspect this is in part due to high sodium diet and excessive fluid intake. Increase lasix  to 80 mg daily x 2 days then back to lasix  40 mg daily.  Add digoxin 0.125 mg daily  GDMT limited by hyperkalemia and BP.  BB-Hold bb due fatigue.  Ace/ARB/ARNI- Check BMET  recent hyperkalemia  MRA- check BMET  SGLT2i- Continue jardiance  10 mg daily  Set up CMRI to further assess etiology.  We discussed if EF remains low will need to consider CRT-D. I am concerned she may need advanced therapies. Will need to follow closely for decompensation. .  Discussed low salt food choices and limiting fluid intake to < 2 liters per day.  Check BMET and BNP   2. CAD 1V diseased LAD 75-80%. No chest pain.  Continue crestor .   3. Hyperkalemia  Recent potassium >5.  Asked to avoid high potassium foods.  Check BMET   4. Tobacco Abuse She is trying to quit. Encouraged to stop smoking.    Follow up in next week to reassess volume status.   Ladarrell Cornwall NP-C  11:21 AM

## 2024-04-17 NOTE — Patient Instructions (Signed)
 START Digoxin 125 mcg daily.  TAKE an extra 40 mg of Lasix  for the next 2 days.  Labs done today, your results will be available in MyChart, we will contact you for abnormal readings.  Your physician has requested that you have a cardiac MRI. Cardiac MRI uses a computer to create images of your heart as its beating, producing both still and moving pictures of your heart and major blood vessels. For further information please visit [email or web address]. Please follow the instruction sheet given to you today for more information. YOU WILL BE CALLED TO HAVE THIS TEST ARRANGED.  Your physician recommends that you schedule a follow-up appointment in: next week.  If you have any questions or concerns before your next appointment please send us  a message through [place] or call our office at [phone].    TO LEAVE A MESSAGE FOR THE NURSE SELECT OPTION 2, PLEASE LEAVE A MESSAGE INCLUDING: YOUR NAME DATE OF BIRTH CALL BACK NUMBER REASON FOR CALL**this is important as we prioritize the call backs  YOU WILL RECEIVE A CALL BACK THE SAME DAY AS LONG AS YOU CALL BEFORE 4:00 PM  At the [facility], you and your health needs are our priority. As part of our continuing mission to provide you with exceptional heart care, we have created designated Provider Care Teams. These Care Teams include your primary Cardiologist (physician) and Advanced Practice Providers (APPs- Physician Assistants and Nurse Practitioners) who all work together to provide you with the care you need, when you need it.   You may see any of the following providers on your designated Care Team at your next follow up: Dr Jules Oar Dr Peder Bourdon Dr. Alwin Baars Dr. Arta Lark Amy Marijane Shoulders, NP Ruddy Corral, [place] [facility] [place], [place] Dennise Fitz, NP [place] Lee, NP Shawnee Dellen, NP Luster Salters, PharmD Bevely Brush, PharmD   Please be sure to bring in all your medications bottles  to every appointment.    Thank you for choosing [facility]

## 2024-04-17 NOTE — Progress Notes (Signed)
 ReDS Vest / Clip - 04/17/24 1116       ReDS Vest / Clip   Station Marker C    Ruler Value 28    ReDS Value Range Low volume    ReDS Actual Value 28

## 2024-04-20 ENCOUNTER — Ambulatory Visit: Admitting: Cardiovascular Disease

## 2024-04-23 ENCOUNTER — Encounter (HOSPITAL_COMMUNITY): Admitting: Cardiology

## 2024-04-24 ENCOUNTER — Other Ambulatory Visit (HOSPITAL_COMMUNITY): Payer: Self-pay | Admitting: Adult Health

## 2024-04-24 ENCOUNTER — Ambulatory Visit (HOSPITAL_COMMUNITY)
Admission: RE | Admit: 2024-04-24 | Discharge: 2024-04-24 | Disposition: A | Discharge status: Home / Self Care | Source: Ambulatory Visit | Attending: Adult Health | Admitting: Adult Health

## 2024-04-24 DIAGNOSIS — I5022 Chronic systolic (congestive) heart failure: Secondary | ICD-10-CM | POA: Insufficient documentation

## 2024-04-24 MED ORDER — GADOBUTROL 1 MMOL/ML IV SOLN
10.0000 mL | Freq: Once | INTRAVENOUS | Status: AC | PRN
  Administered 2024-04-24: 10 mL via INTRAVENOUS

## 2024-05-10 NOTE — Progress Notes (Signed)
 ADVANCED HF CLINIC CONSULT NOTE  Referring Physician: Kandis Ormond, DO Primary Care: Kandis Ormond, DO Primary Cardiologist:  HF MD: Dr Bruce Caper  Chief Complaint: Stage D systolic heart failure  HPI:  Ms Makayla Owens is a 64 year old with a history of tobacco abuse, HFrEF, CAD 1 vessel disease, HLD, LBBB, hx of breast cancer (s/p adriamycin & double mastectomy in 2008/9) presenting today for follow up. Patient reports first feeling SOB in early 2025. She was seen in the ED 03/03/2024 for leg edema. BNP > 2000. TTE with Biventricular HF EF < 20% RV severely reduced. LHC  with single vessel CAD LAD 75-80% out of proportion to extent of heart failure. Medical management was recommended.    Interval history During her last appointment, patient was volume overloaded and still smoking cigarettes with high sodium intake.  Since that time she reports that she has cut back on her sodium she has stopped smoking cigarettes and is compliant with all medications.  She also increased Lasix  to 40 mg daily which has led to significant improvement in symptoms.  Unfortunately, she still becomes very quickly fatigued with overall functional status consistent with NYHA III.   FH- Mom and  brother with extensive coronary disease.   Current Outpatient Medications  Medication Sig Dispense Refill   citalopram  (CELEXA ) 40 MG tablet Take 1 tablet (40 mg total) by mouth daily. 90 tablet 3   digoxin  (LANOXIN ) 0.125 MG tablet Take 1 tablet (0.125 mg total) by mouth daily. 90 tablet 3   empagliflozin  (JARDIANCE ) 10 MG TABS tablet Take 1 tablet (10 mg total) by mouth daily. 90 tablet 3   furosemide  (LASIX ) 20 MG tablet Take 2 tablets (40 mg total) by mouth daily. 30 tablet 1   ibuprofen  (ADVIL ) 200 MG tablet Take 800 mg by mouth 2 (two) times daily.     metFORMIN  (GLUCOPHAGE ) 1000 MG tablet Take 1 tablet (1,000 mg total) by mouth 2 (two) times daily with a meal. 180 tablet 3   rosuvastatin  (CRESTOR ) 20 MG tablet  Take 1 tablet (20 mg total) by mouth daily. 90 tablet 3   No current facility-administered medications for this visit.     There were no vitals filed for this visit.  Wt Readings from Last 3 Encounters:  04/17/24 63.4 kg (139 lb 12.8 oz)  04/07/24 62.1 kg (137 lb)  03/30/24 62.3 kg (137 lb 6.4 oz)    PHYSICAL EXAM: Vitals:   05/11/24 1329  BP: 90/60  Pulse: 96  SpO2: 94%   GENERAL: NAD Lungs- normal work of breathing CARDIAC:  JVP: 9 cm          Normal rate with regular rhythm. no murmur.  Pulses 2+. 1+ edema.  ABDOMEN: Soft, non-tender, non-distended.  EXTREMITIES: Warm and well perfused.  NEUROLOGIC: No obvious FND   DATA  Echo 03/2024 LVEF 20% RV severely reduced.   Cath 04/07/24    Severe single-vessel coronary artery disease with 75 to 80% mid LAD stenosis just after a large first diagonal branch. 2.  Mild nonobstructive plaquing in the left circumflex and RCA 3.  Known severe LV dysfunction with LVEF less than 20%, dilated LV, biventricular dysfunction, with the following hemodynamics: Mean RA pressure 14 mmHg RV pressure 45/5 with an RVEDP of 20 mmHg PA 46/23 mean 33 mmHg Pulmonary wedge pressure 22 mmHg LVEDP 26 mmHg Transpulmonary gradient 11 mmHg with PVR 3 Wood units Borderline cardiac output of 3.47 L/min and cardiac index of 2.04 L/min/m  ASSESSMENT & PLAN: 1. Chronic Biventricular HFrEF - Nonischemic cardiomyopathy: LBBB vs likely chemotherapy induced.  - Cardiac MRI on 04/24/24 with LVEF of 14% and moderately decreased RV function.  - continue digoxin  125mcg daily - continue jardiance  10mg  daily  - start toprol 12.5mg  daily - continue lasix  40mg  daily - LBBB on EKG: refer to EP for CRT-D.  - Schedule PFTs - I am concerned about her long term prognosis. Her LVIDD is 7.8cm. She would benefit from advanced therapies, however, with history of cancer transplant will be higher risk. Will discuss case with [facility] in the near future. LVAD limited  by RV function; although, I believe her RV function may improve as she was fairly volume overloaded during her prior studies.  - BMP/BNP/A1C/digoxin  level today.  - mildly hypervolemic; take extra lasix  40mg  x 1 today.   2. CAD -1V diseased LAD 75-80%.  -Continue crestor .  -no chest pain  3. Hyperkalemia  - Repeat BMP today; resolved on prior labs.   4. Tobacco Abuse - She has now quit smoking.   5. Hx of breast cancer - Received adriamycin.  - Double mastectomy in 2009  I spent 45 minutes caring for this patient today including face to face time, ordering and reviewing labs, reviewing records from [facility], reviewing echo with her, reviewing CMR with her, discussing advanced therapies (transplant vs LVAD), seeing the patient, documenting in the record, and arranging follow ups.   Makayla Owens 2:10 PM

## 2024-05-11 ENCOUNTER — Ambulatory Visit (HOSPITAL_COMMUNITY): Payer: Self-pay | Admitting: Adult Health

## 2024-05-11 ENCOUNTER — Encounter (HOSPITAL_COMMUNITY): Payer: Self-pay | Admitting: Cardiology

## 2024-05-11 ENCOUNTER — Ambulatory Visit (HOSPITAL_COMMUNITY)
Admission: RE | Admit: 2024-05-11 | Discharge: 2024-05-11 | Disposition: A | Discharge status: Home / Self Care | Source: Ambulatory Visit | Attending: Cardiology | Admitting: Cardiology

## 2024-05-11 VITALS — BP 90/60 | HR 96 | Wt 126.8 lb

## 2024-05-11 DIAGNOSIS — Z853 Personal history of malignant neoplasm of breast: Secondary | ICD-10-CM | POA: Diagnosis not present

## 2024-05-11 DIAGNOSIS — I5022 Chronic systolic (congestive) heart failure: Secondary | ICD-10-CM | POA: Diagnosis not present

## 2024-05-11 DIAGNOSIS — Z7984 Long term (current) use of oral hypoglycemic drugs: Secondary | ICD-10-CM | POA: Diagnosis not present

## 2024-05-11 DIAGNOSIS — E875 Hyperkalemia: Secondary | ICD-10-CM | POA: Insufficient documentation

## 2024-05-11 DIAGNOSIS — F17200 Nicotine dependence, unspecified, uncomplicated: Secondary | ICD-10-CM

## 2024-05-11 DIAGNOSIS — I5032 Chronic diastolic (congestive) heart failure: Secondary | ICD-10-CM

## 2024-05-11 DIAGNOSIS — E785 Hyperlipidemia, unspecified: Secondary | ICD-10-CM | POA: Insufficient documentation

## 2024-05-11 DIAGNOSIS — Z9013 Acquired absence of bilateral breasts and nipples: Secondary | ICD-10-CM | POA: Insufficient documentation

## 2024-05-11 DIAGNOSIS — I447 Left bundle-branch block, unspecified: Secondary | ICD-10-CM | POA: Insufficient documentation

## 2024-05-11 DIAGNOSIS — Z8679 Personal history of other diseases of the circulatory system: Secondary | ICD-10-CM | POA: Diagnosis not present

## 2024-05-11 DIAGNOSIS — I2510 Atherosclerotic heart disease of native coronary artery without angina pectoris: Secondary | ICD-10-CM | POA: Insufficient documentation

## 2024-05-11 DIAGNOSIS — I428 Other cardiomyopathies: Secondary | ICD-10-CM | POA: Insufficient documentation

## 2024-05-11 DIAGNOSIS — I5082 Biventricular heart failure: Secondary | ICD-10-CM | POA: Insufficient documentation

## 2024-05-11 DIAGNOSIS — Z87891 Personal history of nicotine dependence: Secondary | ICD-10-CM | POA: Insufficient documentation

## 2024-05-11 DIAGNOSIS — Z79899 Other long term (current) drug therapy: Secondary | ICD-10-CM | POA: Diagnosis not present

## 2024-05-11 LAB — BASIC METABOLIC PANEL WITH GFR
Anion gap: 9 (ref 5–15)
BUN: 11 mg/dL (ref 6–20)
CO2: 26 mmol/L (ref 22–32)
Calcium: 8.9 mg/dL (ref 8.9–10.3)
Chloride: 97 mmol/L — ABNORMAL LOW (ref 98–111)
Creatinine, Ser: 0.53 mg/dL (ref 0.44–1.00)
GFR, Estimated: >60 mL/min (ref >60)
Glucose, Bld: 207 mg/dL — ABNORMAL HIGH (ref 70–99)
Potassium: 4.4 mmol/L (ref 3.5–5.1)
Sodium: 132 mmol/L — ABNORMAL LOW (ref 135–145)

## 2024-05-11 LAB — HEMOGLOBIN A1C
Hgb A1c MFr Bld: 7.6 % — ABNORMAL HIGH (ref 4.8–5.6)
Mean Plasma Glucose: 171.42 mg/dL

## 2024-05-11 LAB — DIGOXIN LEVEL: Digoxin Level: 0.5 ng/mL — ABNORMAL LOW (ref 0.8–2.0)

## 2024-05-11 LAB — BRAIN NATRIURETIC PEPTIDE: B Natriuretic Peptide: 1528.3 pg/mL — ABNORMAL HIGH (ref 0.0–100.0)

## 2024-05-11 MED ORDER — FUROSEMIDE 20 MG PO TABS: 40.0000 mg | ORAL_TABLET | Freq: Every day | ORAL | 3 refills | Status: AC

## 2024-05-11 MED ORDER — METOPROLOL SUCCINATE ER 25 MG PO TB24: 12.5000 mg | ORAL_TABLET | Freq: Every day | ORAL | 3 refills | Status: DC

## 2024-05-11 NOTE — Patient Instructions (Addendum)
 Medication Changes:  TAKE LASIX  (FUROSEMIDE ) 40MG  ONCE DAILY   START METOPROLOL SUCCINATE 12.5MG  ONCE DAILY AT BEDTIME   Lab Work:  Labs done today, your results will be available in MyChart, we will contact you for abnormal readings.  Testing/Procedures:  PULMONARY FUNCTION TESTS- SCHEDULING WILL REACH OUT ONCE APPROVED WITH INSURANCE   Referrals:  YOU HAVE BEEN REFERRED TO ELECTROPHYSIOLOGY FOR DEVICE  THEY WILL REACH OUT TO YOU OR CALL TO ARRANGE THIS. PLEASE CALL US  WITH ANY CONCERNS   Follow-Up in: 1 MONTH AS SCHEDULED   At the [facility], you and your health needs are our priority. We have a designated team specialized in the treatment of Heart Failure. This Care Team includes your primary Heart Failure Specialized Cardiologist (physician), Advanced Practice Providers (APPs- Physician Assistants and Nurse Practitioners), and Pharmacist who all work together to provide you with the care you need, when you need it.   You may see any of the following providers on your designated Care Team at your next follow up:  Dr. Jules Oar Dr. Peder Bourdon Dr. Alwin Baars Dr. Judyth Nunnery Nieves Bars, NP Ruddy Corral, [place] [employer] [place], [place] Dennise Fitz, NP [place] Lee, NP Luster Salters, PharmD   Please be sure to bring in all your medications bottles to every appointment.   Need to Contact Us :  If you have any questions or concerns before your next appointment please send us  a message through [place] or call our office at [phone].    TO LEAVE A MESSAGE FOR THE NURSE SELECT OPTION 2, PLEASE LEAVE A MESSAGE INCLUDING: YOUR NAME DATE OF BIRTH CALL BACK NUMBER REASON FOR CALL**this is important as we prioritize the call backs  YOU WILL RECEIVE A CALL BACK THE SAME DAY AS LONG AS YOU CALL BEFORE 4:00 PM 2

## 2024-05-13 ENCOUNTER — Ambulatory Visit: Attending: Cardiovascular Disease | Admitting: Cardiovascular Disease

## 2024-05-13 ENCOUNTER — Encounter: Payer: Self-pay | Admitting: Cardiovascular Disease

## 2024-05-13 VITALS — BP 102/60 | HR 89 | Ht 66.0 in | Wt 126.6 lb

## 2024-05-13 DIAGNOSIS — E7800 Pure hypercholesterolemia, unspecified: Secondary | ICD-10-CM | POA: Insufficient documentation

## 2024-05-13 DIAGNOSIS — I447 Left bundle-branch block, unspecified: Secondary | ICD-10-CM | POA: Diagnosis present

## 2024-05-13 DIAGNOSIS — I428 Other cardiomyopathies: Secondary | ICD-10-CM | POA: Insufficient documentation

## 2024-05-13 DIAGNOSIS — F17200 Nicotine dependence, unspecified, uncomplicated: Secondary | ICD-10-CM | POA: Diagnosis present

## 2024-05-13 NOTE — Assessment & Plan Note (Signed)
 EF less than 20% with cath that showed incidental moderate mid LAD stenosis performed by Dr. Arlester Ladd with scattered nonobstructive disease otherwise.  Cardiac MRI did not show infiltrative disease but did show severe LV dilatation with severe LV dysfunction as well as RV dysfunction.  GDMT is limited by hypotension although she is on low-dose beta-blocker and digoxin .  She is seeing Dr.Sabharwal in the advanced heart failure clinic.  She is on diuretics and was diuresed 13 pounds.  BMP has come down as well.  She is being referred to Dr. Daneil Dunker, EP, for consideration of CRT.

## 2024-05-13 NOTE — Assessment & Plan Note (Signed)
 Chronic

## 2024-05-13 NOTE — Assessment & Plan Note (Signed)
 Discontinued

## 2024-05-13 NOTE — Assessment & Plan Note (Signed)
 History of hyperlipidemia on statin therapy with lipid profile performed 08/26/2023 revealing total cholesterol 131, LDL of 54 and HDL 60.

## 2024-05-13 NOTE — Progress Notes (Signed)
 05/13/2024 Makayla Owens   1961/01/28  [id number]  Primary Physician Kandis Ormond, DO Primary Cardiologist: Avanell Leigh MD Bennye Bravo, [place]  HPI:  Makayla Owens is a 63 y.o.   thin-appearing married Caucasian female mother of 1 daughter, grandmother of 3 grandchildren referred by the emergency room where she presented yesterday with heart failure.  She is accompanied by Makayla Owens, her mother.  I last saw her in the office/20/25.  Her risk factors include 15 pack years tobacco abuse currently smoking 1/2 pack/day, treated hyperlipidemia and diabetes.  She has a strong family history of heart disease with both mother and brother who have had stents.  She is never had a heart attack or stroke.  She denies chest pain but has noticed increasing dyspnea and lower extreme edema over the last 3 weeks.  She presented to the emergency room yesterday with lower extremity edema and shortness of breath.  She was treated with IV furosemide  and sent home on p.o. furosemide .  Her BNP was elevated at 2785.  She does have a history of chronic left bundle branch block and a remote history of breast cancer in 2008 treated with chemotherapy.   She had a 2D echocardiogram that showed severe right and left ventricular dysfunction with LV dilatation.  I arrange for her to undergo right and left heart cath as an outpatient by Dr. Arlester Ladd 04/07/2024 revealing a mid LAD lesion which is probably incidental and LV EDP of 26.  Based on this it was assumed she had a nonischemic cardiomyopathy.  Cardiac MRI did not show any infiltrative process and did show severe LV dysfunction with LV dilatation.  She was referred to Dr.Sabharwal, and advanced heart failure clinic, for medication optimization.  Given her soft blood pressure she is probably not a candidate for GDMT.  She has diuresed 13 pounds and has no peripheral edema.  She feels clinically improved.  Her BNP has come down to 30 300-15 100.  She has been  referred to Dr. Clinton Danas, EP, for consideration of CRT therapy given her left bundle branch block.   Current Meds  Medication Sig   citalopram  (CELEXA ) 40 MG tablet Take 1 tablet (40 mg total) by mouth daily.   digoxin  (LANOXIN ) 0.125 MG tablet Take 1 tablet (0.125 mg total) by mouth daily.   empagliflozin  (JARDIANCE ) 10 MG TABS tablet Take 1 tablet (10 mg total) by mouth daily.   furosemide  (LASIX ) 20 MG tablet Take 2 tablets (40 mg total) by mouth daily.   ibuprofen  (ADVIL ) 200 MG tablet Take 800 mg by mouth 2 (two) times daily.   metFORMIN  (GLUCOPHAGE ) 1000 MG tablet Take 1 tablet (1,000 mg total) by mouth 2 (two) times daily with a meal.   metoprolol succinate (TOPROL-XL) 25 MG 24 hr tablet Take 0.5 tablets (12.5 mg total) by mouth at bedtime.   rosuvastatin  (CRESTOR ) 20 MG tablet Take 1 tablet (20 mg total) by mouth daily.     Allergies  Allergen Reactions   Latex Rash   Nitrofurantoin Itching and Rash   Elavil  [Amitriptyline ]     Felt bad and seemed to cause UTIs   Lipitor [Atorvastatin ] Nausea Only    Muscle pain and nausea   Morphine  And Codeine  Other (See Comments)    Does not like side effects, pt could not sleep until out of her system   Other     Pt does not want narcotics   Statins  Body aches   Zofran  [Ondansetron  Hcl] Nausea And Vomiting    Social History   Socioeconomic History   Marital status: Married    Spouse name: Not on file   Number of children: Not on file   Years of education: Not on file   Highest education level: Not on file  Occupational History   Not on file  Tobacco Use   Smoking status: Every Day    Current packs/day: 0.25    Average packs/day: 0.3 packs/day for 17.0 years (4.3 ttl pk-yrs)    Types: Cigarettes   Smokeless tobacco: Never  Vaping Use   Vaping status: Never Used  Substance and Sexual Activity   Alcohol use: No    Alcohol/week: 0.0 standard drinks of alcohol   Drug use: No   Sexual activity: Yes  Other Topics  Concern   Not on file  Social History Narrative   Not on file   Social Drivers of Health   Financial Resource Strain: Medium Risk (03/10/2024)   Overall Financial Resource Strain (CARDIA)    Difficulty of Paying Living Expenses: Somewhat hard  Food Insecurity: Food Insecurity Present (03/10/2024)   Hunger Vital Sign    Worried About Running Out of Food in the Last Year: Sometimes true    Ran Out of Food in the Last Year: Sometimes true  Transportation Needs: No Transportation Needs (03/10/2024)   PRAPARE - Administrator, Civil Service (Medical): No    Lack of Transportation (Non-Medical): No  Physical Activity: Not on file  Stress: Not on file  Social Connections: Unknown (04/17/2023)   Received from [facility], [facility]   Social Network    Social Network: Not on file  Intimate Partner Violence: Unknown (04/17/2023)   Received from [facility], [facility]   HITS    Physically Hurt: Not on file    Insult or Talk Down To: Not on file    Threaten Physical Harm: Not on file    Scream or Curse: Not on file     Review of Systems: General: negative for chills, fever, night sweats or weight changes.  Cardiovascular: negative for chest pain, dyspnea on exertion, edema, orthopnea, palpitations, paroxysmal nocturnal dyspnea or shortness of breath Dermatological: negative for rash Respiratory: negative for cough or wheezing Urologic: negative for hematuria Abdominal: negative for nausea, vomiting, diarrhea, bright red blood per rectum, melena, or hematemesis Neurologic: negative for visual changes, syncope, or dizziness All other systems reviewed and are otherwise negative except as noted above.    Blood pressure 102/60, pulse 89, height 5' 6 (1.676 m), weight 126 lb 9.6 oz (57.4 kg), SpO2 95%.  General appearance: alert and no distress Neck: no adenopathy, no carotid bruit, no JVD, supple, symmetrical, trachea midline, and thyroid  not enlarged, symmetric, no  tenderness/mass/nodules Lungs: clear to auscultation bilaterally Heart: regular rate and rhythm, S1, S2 normal, no murmur, click, rub or gallop Extremities: extremities normal, atraumatic, no cyanosis or edema Pulses: 2+ and symmetric Skin: Skin color, texture, turgor normal. No rashes or lesions Neurologic: Grossly normal  EKG not performed today      ASSESSMENT AND PLAN:   HYPERCHOLESTEROLEMIA History of hyperlipidemia on statin therapy with lipid profile performed 08/26/2023 revealing total cholesterol 131, LDL of 54 and HDL 60.  Tobacco use disorder Discontinued  Left bundle branch block Chronic  Nonischemic cardiomyopathy (HCC) EF less than 20% with cath that showed incidental moderate mid LAD stenosis performed by Dr. Arlester Ladd with scattered nonobstructive disease otherwise.  Cardiac MRI did not show infiltrative disease but did show severe LV dilatation with severe LV dysfunction as well as RV dysfunction.  GDMT is limited by hypotension although she is on low-dose beta-blocker and digoxin .  She is seeing Dr.Sabharwal in the advanced heart failure clinic.  She is on diuretics and was diuresed 13 pounds.  BMP has come down as well.  She is being referred to Dr. Daneil Dunker, EP, for consideration of CRT.     Avanell Leigh MD FACP,FACC,FAHA, [facility] 05/13/2024 9:24 AM

## 2024-05-13 NOTE — Patient Instructions (Signed)
 Medication Instructions:  Your physician recommends that you continue on your current medications as directed. Please refer to the Current Medication list given to you today.  *If you need a refill on your cardiac medications before your next appointment, please call your pharmacy*  Follow-Up: At [facility], you and your health needs are our priority.  As part of our continuing mission to provide you with exceptional heart care, our providers are all part of one team.  This team includes your primary Cardiologist (physician) and Advanced Practice Providers or APPs (Physician Assistants and Nurse Practitioners) who all work together to provide you with the care you need, when you need it.  Your next appointment:   6 month(s)  Provider:   Lauro Portal, MD    We recommend signing up for the patient portal called "MyChart".  Sign up information is provided on this After Visit Summary.  MyChart is used to connect with patients for Virtual Visits (Telemedicine).  Patients are able to view lab/test results, encounter notes, upcoming appointments, etc.  Non-urgent messages can be sent to your provider as well.   To learn more about what you can do with MyChart, go to [email or web address].

## 2024-05-18 NOTE — Progress Notes (Unsigned)
 Electrophysiology Office Note:   Date:  05/20/2024  ID:  Makayla Owens, DOB Jan 02, 1961, MRN [id number]  Primary Cardiologist: None Electrophysiologist: Ardeen Kohler, MD      History of Present Illness:   Makayla KINNAMON is a 63 y.o. female with h/o tobacco abuse, HFrEF, CAD 1 vessel disease, HLD, LBBB, hx of breast cancer (s/p adriamycin & double mastectomy in 2008/9) who is being seen today for evaluaiton for CRT-D.   She was seen in the ED 03/03/2024 for leg edema. BNP > 2000. TTE with Biventricular HF EF < 20% RV severely reduced. LHC  with single vessel CAD LAD 75-80% out of proportion to extent of heart failure. She has been started on medications for her reduced LVEF, which she is taking. Medications have been limited by blood pressure. She has diuresed well. Despite this, she continues to have exertional fatigue and dyspnea with minimal activity.   Review of systems complete and found to be negative unless listed in HPI.   EP Information / Studies Reviewed:    EKG is not ordered today. EKG from 04/17/24 reviewed which showed sinus rhythm with LBBB, QRS .      Echo 03/19/24:   1. No evidence of LV thrombus by Definity . Left ventricular ejection  fraction, by estimation, is <20%. Left ventricular ejection fraction by 3D  volume is 26 %. The left ventricle has severely decreased function. The  left ventricle demonstrates global  hypokinesis. The left ventricular internal cavity size was severely  dilated. Indeterminate diastolic filling due to E-A fusion.   2. Right ventricular systolic function is severely reduced. The right  ventricular size is normal. There is normal pulmonary artery systolic  pressure.   3. Left atrial size was moderately dilated.   4. Right atrial size was mildly dilated.   5. A small pericardial effusion is present. The pericardial effusion is  localized near the right atrium and posterior to the left ventricle.   6. The mitral valve is normal in  structure. Mild mitral valve  regurgitation. No evidence of mitral stenosis.   7. The aortic valve is tricuspid. There is mild calcification of the  aortic valve. Aortic valve regurgitation is not visualized. No aortic  stenosis is present.   8. The inferior vena cava is dilated in size with <50% respiratory  variability, suggesting right atrial pressure of 15 mmHg.   Cardiac MRI 04/24/24:  IMPRESSION: 1. Severe decrease in left ventricular systolic function (LVEF =14%). Severely dilated left ventricular size.   2. Moderate decrease in right ventricular systolic function (RVEF =31%). Severe dilation in right ventricular size.   3. Moderate, circumferential pericardial effusion without evidence of tamponade.      Physical Exam:   VS:  BP 101/63   Pulse 82   Ht 5' 6 (1.676 m)   Wt 126 lb (57.2 kg)   SpO2 96%   BMI 20.34 kg/m    Wt Readings from Last 3 Encounters:  05/19/24 126 lb (57.2 kg)  05/13/24 126 lb 9.6 oz (57.4 kg)  05/11/24 126 lb 12.8 oz (57.5 kg)     GEN: Well nourished, well developed in no acute distress NECK: No JVD CARDIAC: Normal rate, regular rhythm RESPIRATORY:  Clear to auscultation without rales, wheezing or rhonchi  ABDOMEN: Soft, non-distended EXTREMITIES:  No edema; No deformity   ASSESSMENT AND PLAN:    #. Chronic systolic heart failure: Predominantly NICM (either LBBB or chemo-induced), although does have LAD disease, HF felt to be out of  proportion to CAD. NYHA class III. #. LBBB:  Rather typical appearing, QRS . - Patient will meet criteria for CRT if LVEF remains depressed after 3 months of guideline directed medical therapy.  She has left bundle branch block and NYHA class III symptoms.  We will arrange for limited echo to reevaluate LVEF on July 17th.  We will tentatively arrange for CRT D implant after that date. If LVEF normalizes then we will cancel, although I do not suspect this will be the case. If LVEF improves to >35% but remains  below 50% then we will pursue CRT.  - Continue excellent GDMT regimen - empagliflozin  10 mg once daily, metoprolol  XL 12.5 mg once daily - and follow-up with primary HF cardiologist, Dr. Bruce Caper.  #. 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  - Continue rosuvastatin  20mg  daily.  #. H/o tobacco abuse: - She has now quit smoking.   Follow up with Dr. Daneil Dunker 3 months after CRT implant.   Signed, Ardeen Kohler, MD

## 2024-05-19 ENCOUNTER — Other Ambulatory Visit: Payer: Self-pay

## 2024-05-19 ENCOUNTER — Ambulatory Visit: Attending: Cardiology | Admitting: Cardiology

## 2024-05-19 ENCOUNTER — Encounter: Payer: Self-pay | Admitting: Cardiology

## 2024-05-19 VITALS — BP 101/63 | HR 82 | Ht 66.0 in | Wt 126.0 lb

## 2024-05-19 DIAGNOSIS — I428 Other cardiomyopathies: Secondary | ICD-10-CM

## 2024-05-19 DIAGNOSIS — I447 Left bundle-branch block, unspecified: Secondary | ICD-10-CM | POA: Insufficient documentation

## 2024-05-19 DIAGNOSIS — I2510 Atherosclerotic heart disease of native coronary artery without angina pectoris: Secondary | ICD-10-CM | POA: Insufficient documentation

## 2024-05-19 DIAGNOSIS — I5022 Chronic systolic (congestive) heart failure: Secondary | ICD-10-CM

## 2024-05-19 NOTE — Patient Instructions (Signed)
 Medication Instructions:  Your physician recommends that you continue on your current medications as directed. Please refer to the Current Medication list given to you today.  *If you need a refill on your cardiac medications before your next appointment, please call your pharmacy*  Lab Work: BMET and CBC   Testing/Procedures: Echocardiogram  Your physician has requested that you have an echocardiogram. Echocardiography is a painless test that uses sound waves to create images of your heart. It provides your doctor with information about the size and shape of your heart and how well your heart's chambers and valves are working. This procedure takes approximately one hour. There are no restrictions for this procedure. Please do NOT wear cologne, perfume, aftershave, or lotions (deodorant is allowed). Please arrive 15 minutes prior to your appointment time.  Please note: We ask at that you not bring children with you during ultrasound (echo/ vascular) testing. Due to room size and safety concerns, children are not allowed in the ultrasound rooms during exams. Our front office staff cannot provide observation of children in our lobby area while testing is being conducted. An adult accompanying a patient to their appointment will only be allowed in the ultrasound room at the discretion of the ultrasound technician under special circumstances. We apologize for any inconvenience.  ICD Implant Your physician has recommended that you have a defibrillator inserted. An implantable cardioverter defibrillator (ICD) is a small device that is placed in your chest or, in rare cases, your abdomen. This device uses electrical pulses or shocks to help control life-threatening, irregular heartbeats that could lead the heart to suddenly stop beating (sudden cardiac arrest). Leads are attached to the ICD that goes into your heart. This is done in the hospital and usually requires an overnight stay. Please see the  instruction sheet given to you today for more information.  Follow-Up: At [facility], you and your health needs are our priority.  As part of our continuing mission to provide you with exceptional heart care, our providers are all part of one team.  This team includes your primary Cardiologist (physician) and Advanced Practice Providers or APPs (Physician Assistants and Nurse Practitioners) who all work together to provide you with the care you need, when you need it.  Your next appointment:   We will call you to schedule your post-procedure follow up appointments

## 2024-05-20 ENCOUNTER — Telehealth: Payer: Self-pay | Admitting: Cardiology

## 2024-05-20 NOTE — Telephone Encounter (Signed)
 Patient states she was told by Dr. Daneil Dunker that for her procedure to be covered by insurance she will need to have an echo first. Her echo is scheduled for 07/01/24 and her procedure is the following day on 07/02/24.  Patient is calling to see if this is OK or if she needs to have echo rescheduled for a sooner date. She states she can have done at [facility], which is closer to her.

## 2024-05-20 NOTE — Telephone Encounter (Signed)
 Spoke with the patient and advised that echocardiogram has been moved to 7/22 at [facility]

## 2024-05-20 NOTE — Telephone Encounter (Signed)
 Message has been sent to [facility] scheduling team to check on sooner echo.

## 2024-05-20 NOTE — Telephone Encounter (Signed)
 New Message:    Patient says she is scheduled for a procedure on 07-02-24 and scheduled for an Echo on 07-01-24. Patient wants to know if this will be okay, to have her Echo just one day before her procedure?

## 2024-05-26 ENCOUNTER — Telehealth: Payer: Self-pay

## 2024-05-26 NOTE — Telephone Encounter (Signed)
 Pt's CRT-D Implant with Dr. Kennyth has been moved from 7/31 to 7/30 at 1:30 pm due to having to schedule an extraction on 7/31.  Pt is aware and I have mailed her a new instruction letter with updated date/time and medication instructions.

## 2024-05-28 ENCOUNTER — Ambulatory Visit (HOSPITAL_COMMUNITY)
Admission: RE | Admit: 2024-05-28 | Discharge: 2024-05-28 | Disposition: A | Discharge status: Home / Self Care | Source: Ambulatory Visit | Attending: Cardiology | Admitting: Cardiology

## 2024-05-28 DIAGNOSIS — I5022 Chronic systolic (congestive) heart failure: Secondary | ICD-10-CM | POA: Diagnosis present

## 2024-05-28 LAB — PULMONARY FUNCTION TEST
DL/VA % pred: 111 %
DL/VA: 4.64 ml/min/mmHg/L
DLCO unc % pred: 86 %
DLCO unc: 18.58 ml/min/mmHg
FEF 25-75 Post: 1.73 L/s
FEF 25-75 Pre: 1.36 L/s
FEF2575-%Change-Post: 27 %
FEF2575-%Pred-Post: 70 %
FEF2575-%Pred-Pre: 54 %
FEV1-%Change-Post: 13 %
FEV1-%Pred-Post: 67 %
FEV1-%Pred-Pre: 59 %
FEV1-Post: 1.85 L
FEV1-Pre: 1.63 L
FEV1FVC-%Change-Post: 13 %
FEV1FVC-%Pred-Pre: 89 %
FEV6-%Change-Post: -2 %
FEV6-%Pred-Post: 66 %
FEV6-%Pred-Pre: 68 %
FEV6-Post: 2.29 L
FEV6-Pre: 2.34 L
FEV6FVC-%Pred-Post: 103 %
FEV6FVC-%Pred-Pre: 103 %
FVC-%Change-Post: 0 %
FVC-%Pred-Post: 65 %
FVC-%Pred-Pre: 65 %
FVC-Post: 2.34 L
FVC-Pre: 2.34 L
Post FEV1/FVC ratio: 79 %
Post FEV6/FVC ratio: 100 %
Pre FEV1/FVC ratio: 70 %
Pre FEV6/FVC Ratio: 100 %
RV % pred: 127 %
RV: 2.67 L
TLC % pred: 93 %
TLC: 4.99 L

## 2024-05-28 MED ORDER — ALBUTEROL SULFATE (2.5 MG/3ML) 0.083% IN NEBU
2.5000 mg | INHALATION_SOLUTION | Freq: Once | RESPIRATORY_TRACT | Status: AC
  Administered 2024-05-28: 2.5 mg via RESPIRATORY_TRACT

## 2024-06-03 ENCOUNTER — Other Ambulatory Visit: Payer: Self-pay | Admitting: Family Medicine

## 2024-06-03 DIAGNOSIS — F32A Depression, unspecified: Secondary | ICD-10-CM

## 2024-06-03 DIAGNOSIS — E1122 Type 2 diabetes mellitus with diabetic chronic kidney disease: Secondary | ICD-10-CM

## 2024-06-08 NOTE — Progress Notes (Signed)
 ADVANCED HF CLINIC CONSULT NOTE  Referring Physician: Madelon Donald HERO, DO Primary Care: Madelon Donald HERO, DO Primary Cardiologist:  HF MD: Dr Gardenia  Chief Complaint: Stage D systolic heart failure  HPI:  Makayla Owens is a 63 year old with a history of tobacco abuse, HFrEF, CAD, HLD, LBBB, hx of breast cancer (s/p adriamycin & double mastectomy in 2008/9) presenting today for follow up. Patient reports first feeling SOB in early 2025. She was seen in the ED 03/03/2024 for leg edema. BNP > 2000. TTE with Biventricular HF EF < 20% RV severely reduced. LHC  with single vessel CAD LAD 75-80% out of proportion to extent of heart failure. Medical management was recommended.  Since that time she established care in AHF clinic with gradual uptitration of GDMT.   Family hx: Mom and brother with extensive coronary disease.   Interval history - Volume status has improved significantly; she reports feeling significant improvement in abdominal distention and lower extremity edema.  - Remains NYHA III due to significant dyspnea with minimal exertion. No lightheadedness, chest pain, palpitations.  - Unable to tolerate metoprolol .    Current Outpatient Medications  Medication Sig Dispense Refill   albuterol  (VENTOLIN  HFA) 108 (90 Base) MCG/ACT inhaler Inhale 2 puffs into the lungs every 6 (six) hours as needed for wheezing or shortness of breath. 8 g 2   citalopram  (CELEXA ) 40 MG tablet Take 1 tablet by mouth once daily 90 tablet 3   digoxin  (LANOXIN ) 0.125 MG tablet Take 1 tablet (0.125 mg total) by mouth daily. 90 tablet 3   empagliflozin  (JARDIANCE ) 10 MG TABS tablet Take 1 tablet (10 mg total) by mouth daily. 90 tablet 3   furosemide  (LASIX ) 20 MG tablet Take 2 tablets (40 mg total) by mouth daily. 180 tablet 3   ibuprofen  (ADVIL ) 200 MG tablet Take 800 mg by mouth 2 (two) times daily.     metFORMIN  (GLUCOPHAGE ) 1000 MG tablet TAKE 1 TABLET BY MOUTH TWICE DAILY WITH A MEAL 180 tablet 3    rosuvastatin  (CRESTOR ) 20 MG tablet Take 1 tablet (20 mg total) by mouth daily. 90 tablet 3   spironolactone  (ALDACTONE ) 25 MG tablet Take 0.5 tablets (12.5 mg total) by mouth daily. 45 tablet 3   No current facility-administered medications for this encounter.     Vitals:   06/10/24 0900  BP: 102/60  Pulse: 93  SpO2: 96%  Weight: 58.5 kg (129 lb)    Wt Readings from Last 3 Encounters:  06/10/24 58.5 kg (129 lb)  05/19/24 57.2 kg (126 lb)  05/13/24 57.4 kg (126 lb 9.6 oz)    PHYSICAL EXAM: Vitals:   06/10/24 0900  BP: 102/60  Pulse: 93  SpO2: 96%   GENERAL: NAD Lungs- mild exp wheezing CARDIAC:  JVP: 7 cm          Normal rate with regular rhythm. no murmur.  Pulses 2+. no edema.  ABDOMEN: Soft, non-tender, non-distended.  EXTREMITIES: Warm and well perfused.  NEUROLOGIC: No obvious FND  DATA Echo: 03/2024 LVEF 20% RV severely reduced.   Cath: 04/07/24  Severe single-vessel coronary artery disease with 75 to 80% mid LAD stenosis just after a large first diagonal branch. 2.  Mild nonobstructive plaquing in the left circumflex and RCA 3.  Known severe LV dysfunction with LVEF less than 20%, dilated LV, biventricular dysfunction, with the following hemodynamics: Mean RA pressure 14 mmHg RV pressure 45/5 with an RVEDP of 20 mmHg PA 46/23 mean 33 mmHg Pulmonary  wedge pressure 22 mmHg LVEDP 26 mmHg Transpulmonary gradient 11 mmHg with PVR 3 Wood units Borderline cardiac output of 3.47 L/min and cardiac index of 2.04 L/min/m   ASSESSMENT & PLAN: 1. Chronic Biventricular HFrEF - Nonischemic cardiomyopathy: LBBB vs likely chemotherapy induced.  - Cardiac MRI on 04/24/24 with LVEF of 14% and moderately decreased RV function.  - continue digoxin  125mcg daily; repeat level today - continue jardiance  10mg  daily  - unable to tolerate metoprolol  - continue lasix  40mg  daily.  - Star spironolactone  12.5mg  daily.  - Plan for BiV ICD on 07/01/24. Limited echo 06/23/24 - I am  concerned about her long term prognosis. Her LVIDD is 7.8cm. She would benefit from advanced therapies, however, with history of cancer transplant will be higher risk. Will discuss case with [facility] in the near future. LVAD limited by RV function; although, I believe her RV function may improve as she was fairly volume overloaded during her prior studies.  - BMP/BNP/CBC/dig level today.   2. CAD -1V diseased LAD 75-80%.  - Crestor  20mg  daily  - No chest pain  3. Hyperkalemia  - BMP pending, resolved previously.   4. Tobacco Abuse - She has now quit smoking.   5. Hx of breast cancer - Received adriamycin.  - Double mastectomy in 2009  6. COPD  - Reduced lung volumes on PFTs; although this doesn't meet criteria for severe COPD, she has a long history of smoking and had panic attacks when becoming dyspneic. Mild exp wheezing on exam.  - Albuterol  PRN - Refer to pulmonology  Nehal Shives 9:43 AM

## 2024-06-10 ENCOUNTER — Ambulatory Visit (HOSPITAL_COMMUNITY)
Admission: RE | Admit: 2024-06-10 | Discharge: 2024-06-10 | Disposition: A | Discharge status: Home / Self Care | Source: Ambulatory Visit | Attending: Cardiology | Admitting: Cardiology

## 2024-06-10 ENCOUNTER — Encounter (HOSPITAL_COMMUNITY): Payer: Self-pay | Admitting: Cardiology

## 2024-06-10 VITALS — BP 102/60 | HR 93 | Wt 129.0 lb

## 2024-06-10 DIAGNOSIS — Z8249 Family history of ischemic heart disease and other diseases of the circulatory system: Secondary | ICD-10-CM | POA: Insufficient documentation

## 2024-06-10 DIAGNOSIS — Z853 Personal history of malignant neoplasm of breast: Secondary | ICD-10-CM | POA: Insufficient documentation

## 2024-06-10 DIAGNOSIS — E875 Hyperkalemia: Secondary | ICD-10-CM | POA: Insufficient documentation

## 2024-06-10 DIAGNOSIS — I5082 Biventricular heart failure: Secondary | ICD-10-CM | POA: Diagnosis not present

## 2024-06-10 DIAGNOSIS — J449 Chronic obstructive pulmonary disease, unspecified: Secondary | ICD-10-CM | POA: Insufficient documentation

## 2024-06-10 DIAGNOSIS — Z9013 Acquired absence of bilateral breasts and nipples: Secondary | ICD-10-CM | POA: Insufficient documentation

## 2024-06-10 DIAGNOSIS — Z7984 Long term (current) use of oral hypoglycemic drugs: Secondary | ICD-10-CM | POA: Diagnosis not present

## 2024-06-10 DIAGNOSIS — F17200 Nicotine dependence, unspecified, uncomplicated: Secondary | ICD-10-CM

## 2024-06-10 DIAGNOSIS — Z79899 Other long term (current) drug therapy: Secondary | ICD-10-CM | POA: Insufficient documentation

## 2024-06-10 DIAGNOSIS — I447 Left bundle-branch block, unspecified: Secondary | ICD-10-CM | POA: Diagnosis not present

## 2024-06-10 DIAGNOSIS — E785 Hyperlipidemia, unspecified: Secondary | ICD-10-CM | POA: Insufficient documentation

## 2024-06-10 DIAGNOSIS — I2510 Atherosclerotic heart disease of native coronary artery without angina pectoris: Secondary | ICD-10-CM | POA: Insufficient documentation

## 2024-06-10 DIAGNOSIS — I428 Other cardiomyopathies: Secondary | ICD-10-CM | POA: Diagnosis not present

## 2024-06-10 DIAGNOSIS — Z87891 Personal history of nicotine dependence: Secondary | ICD-10-CM | POA: Insufficient documentation

## 2024-06-10 DIAGNOSIS — R0602 Shortness of breath: Secondary | ICD-10-CM

## 2024-06-10 DIAGNOSIS — I5022 Chronic systolic (congestive) heart failure: Secondary | ICD-10-CM | POA: Diagnosis present

## 2024-06-10 DIAGNOSIS — E7800 Pure hypercholesterolemia, unspecified: Secondary | ICD-10-CM

## 2024-06-10 LAB — BASIC METABOLIC PANEL WITH GFR
Anion gap: 11 (ref 5–15)
BUN: 10 mg/dL (ref 6–20)
CO2: 27 mmol/L (ref 22–32)
Calcium: 9.4 mg/dL (ref 8.9–10.3)
Chloride: 98 mmol/L (ref 98–111)
Creatinine, Ser: 0.55 mg/dL (ref 0.44–1.00)
GFR, Estimated: >60 mL/min (ref >60)
Glucose, Bld: 126 mg/dL — ABNORMAL HIGH (ref 70–99)
Potassium: 4.8 mmol/L (ref 3.5–5.1)
Sodium: 136 mmol/L (ref 135–145)

## 2024-06-10 LAB — CBC
HCT: 48.4 % — ABNORMAL HIGH (ref 36.0–46.0)
Hemoglobin: 16.1 g/dL — ABNORMAL HIGH (ref 12.0–15.0)
MCH: 30.1 pg (ref 26.0–34.0)
MCHC: 33.3 g/dL (ref 30.0–36.0)
MCV: 90.6 fL (ref 80.0–100.0)
Platelets: 225 K/uL (ref 150–400)
RBC: 5.34 MIL/uL — ABNORMAL HIGH (ref 3.87–5.11)
RDW: 16.3 % — ABNORMAL HIGH (ref 11.5–15.5)
WBC: 8.4 K/uL (ref 4.0–10.5)
nRBC: 0 % (ref 0.0–0.2)

## 2024-06-10 LAB — DIGOXIN LEVEL: Digoxin Level: 0.6 ng/mL — ABNORMAL LOW (ref 0.8–2.0)

## 2024-06-10 LAB — BRAIN NATRIURETIC PEPTIDE: B Natriuretic Peptide: 968.1 pg/mL — ABNORMAL HIGH (ref 0.0–100.0)

## 2024-06-10 MED ORDER — ALBUTEROL SULFATE HFA 108 (90 BASE) MCG/ACT IN AERS: 2.0000 | INHALATION_SPRAY | Freq: Four times a day (QID) | RESPIRATORY_TRACT | 2 refills | Status: AC | PRN

## 2024-06-10 MED ORDER — SPIRONOLACTONE 25 MG PO TABS: 12.5000 mg | ORAL_TABLET | Freq: Every day | ORAL | 3 refills | Status: DC

## 2024-06-10 NOTE — Patient Instructions (Signed)
 Medication Changes:  START: SPIRONOLACTONE  12.5MG  ONCE DAILY   STOP TAKING METOPROLOL  SUCCINATE   ALBUTEROL  INHALER AS NEEDED SENT TO PHARMACY   Lab Work:  Labs done today, your results will be available in MyChart, we will contact you for abnormal readings.  Referrals:  YOU HAVE BEEN REFERRED TO PULMONOLOGY THEY WILL REACH OUT TO YOU OR CALL TO ARRANGE THIS. PLEASE CALL US  WITH ANY CONCERNS   Follow-Up in: 2 MONTHS AS SCHEDULED WITH DR. GARDENIA   At the Advanced [facility] Failure [facility], you and your health needs are our priority. We have a designated team specialized in the treatment of Heart Failure. This Care Team includes your primary Heart Failure Specialized Cardiologist (physician), Advanced Practice Providers (APPs- Physician Assistants and Nurse Practitioners), and Pharmacist who all work together to provide you with the care you need, when you need it.   You may see any of the following providers on your designated Care Team at your next follow up:  Dr. Toribio Fuel Dr. Ezra Shuck Dr. Ria GARDENIA Dr. Odis Brownie Greig Mosses, NP Caffie Shed, [place] [facility] [place], [place] Beckey Coe, NP [place] Lee, NP Tinnie Redman, PharmD   Please be sure to bring in all your medications bottles to every appointment.   Need to Contact Us :  If you have any questions or concerns before your next appointment please send us  a message through [place] or call our office at [phone].    TO LEAVE A MESSAGE FOR THE NURSE SELECT OPTION 2, PLEASE LEAVE A MESSAGE INCLUDING: YOUR NAME DATE OF BIRTH CALL BACK NUMBER REASON FOR CALL**this is important as we prioritize the call backs  YOU WILL RECEIVE A CALL BACK THE SAME DAY AS LONG AS YOU CALL BEFORE 4:00 PM

## 2024-06-23 ENCOUNTER — Ambulatory Visit (HOSPITAL_COMMUNITY)
Admission: RE | Admit: 2024-06-23 | Discharge: 2024-06-23 | Disposition: A | Discharge status: Home / Self Care | Source: Ambulatory Visit | Attending: Cardiology | Admitting: Cardiology

## 2024-06-23 DIAGNOSIS — I2510 Atherosclerotic heart disease of native coronary artery without angina pectoris: Secondary | ICD-10-CM | POA: Insufficient documentation

## 2024-06-23 DIAGNOSIS — I447 Left bundle-branch block, unspecified: Secondary | ICD-10-CM | POA: Diagnosis not present

## 2024-06-23 DIAGNOSIS — I5022 Chronic systolic (congestive) heart failure: Secondary | ICD-10-CM | POA: Insufficient documentation

## 2024-06-23 DIAGNOSIS — I428 Other cardiomyopathies: Secondary | ICD-10-CM | POA: Insufficient documentation

## 2024-06-23 LAB — ECHOCARDIOGRAM COMPLETE
Area-P 1/2: 4.85 cm2
Calc EF: 17.5 %
Est EF: <20
S' Lateral: 6.7 cm
Single Plane A2C EF: 18.8 %
Single Plane A4C EF: 11.7 %

## 2024-06-23 NOTE — Progress Notes (Signed)
  Echocardiogram 2D Echocardiogram has been performed.  Makayla Owens 06/23/2024, 10:28 AM

## 2024-06-24 ENCOUNTER — Ambulatory Visit: Payer: Self-pay | Admitting: Cardiology

## 2024-06-24 ENCOUNTER — Telehealth (HOSPITAL_COMMUNITY): Payer: Self-pay

## 2024-06-24 NOTE — Telephone Encounter (Signed)
 Spoke with patient to discuss upcoming procedure.   Confirmed patient is scheduled for a Biventricular implantable cardioverter defibrillator on Wednesday, July 30 with Dr. Sidra Kitty. Instructed patient to arrive at the Main Entrance A at [facility]: [address] [place], [place] [address] and check in at Admitting at 2:00 PM.   Labs completed  Any recent signs of acute illness or been started on antibiotics? No Any new medications started? No Any medications to hold? Hold Jardiance  for 3 days prior to the procedure- last dose on July 26. Hold Metformin , Lasix  and Spironolactone  on the morning of your procedure. Medication instructions:  On the morning of your procedure you may take morning medications not discussed with a sip of water .  You may have a light breakfast the morning of your procedure before 8 AM. No eating or drinking after 8 AM day of procedure.   The night before your procedure and the morning of your procedure, wash thoroughly with the CHG surgical soap from the neck down, paying special attention to the area where your procedure will be performed.  Advised of plan to stay overnight if medically necessary. You MUST have a responsible adult to drive you home and MUST be with you the first 24 hours after you arrive home.  Patient verbalized understanding to all instructions provided and agreed to proceed with procedure.

## 2024-06-30 NOTE — Pre-Procedure Instructions (Signed)
 Spoke with Consuelo, patient's mother.  Reviewed instructions on the following items: Arrival time 1:00- new arrival time Nothing to eat or drink after midnight No meds AM of procedure Responsible person to drive you home and stay with you for 24 hrs Wash with special soap night before and morning of procedure

## 2024-07-01 ENCOUNTER — Other Ambulatory Visit (HOSPITAL_COMMUNITY)

## 2024-07-01 ENCOUNTER — Encounter (HOSPITAL_COMMUNITY): Payer: Self-pay | Admitting: Cardiology

## 2024-07-01 ENCOUNTER — Encounter (HOSPITAL_COMMUNITY)
Admission: RE | Disposition: A | Discharge status: Home / Self Care | Payer: Self-pay | Source: Home / Self Care | Attending: Cardiology

## 2024-07-01 ENCOUNTER — Other Ambulatory Visit: Payer: Self-pay

## 2024-07-01 ENCOUNTER — Ambulatory Visit (HOSPITAL_COMMUNITY)
Admission: RE | Admit: 2024-07-01 | Discharge: 2024-07-02 | Disposition: A | Discharge status: Home / Self Care | Attending: Cardiology | Admitting: Cardiology

## 2024-07-01 DIAGNOSIS — I5022 Chronic systolic (congestive) heart failure: Secondary | ICD-10-CM

## 2024-07-01 DIAGNOSIS — Z853 Personal history of malignant neoplasm of breast: Secondary | ICD-10-CM | POA: Insufficient documentation

## 2024-07-01 DIAGNOSIS — I447 Left bundle-branch block, unspecified: Secondary | ICD-10-CM | POA: Diagnosis not present

## 2024-07-01 DIAGNOSIS — I5082 Biventricular heart failure: Secondary | ICD-10-CM | POA: Diagnosis not present

## 2024-07-01 DIAGNOSIS — I2510 Atherosclerotic heart disease of native coronary artery without angina pectoris: Secondary | ICD-10-CM | POA: Diagnosis not present

## 2024-07-01 DIAGNOSIS — E785 Hyperlipidemia, unspecified: Secondary | ICD-10-CM | POA: Diagnosis not present

## 2024-07-01 DIAGNOSIS — I428 Other cardiomyopathies: Secondary | ICD-10-CM | POA: Diagnosis not present

## 2024-07-01 DIAGNOSIS — Z79899 Other long term (current) drug therapy: Secondary | ICD-10-CM | POA: Diagnosis not present

## 2024-07-01 DIAGNOSIS — Z9013 Acquired absence of bilateral breasts and nipples: Secondary | ICD-10-CM | POA: Insufficient documentation

## 2024-07-01 DIAGNOSIS — Z87891 Personal history of nicotine dependence: Secondary | ICD-10-CM | POA: Diagnosis not present

## 2024-07-01 LAB — GLUCOSE, CAPILLARY: Glucose-Capillary: 91 mg/dL (ref 70–99)

## 2024-07-01 SURGERY — BIV ICD INSERTION CRT-D

## 2024-07-01 MED ORDER — FENTANYL CITRATE (PF) 100 MCG/2ML IJ SOLN
INTRAMUSCULAR | Status: DC | PRN
  Administered 2024-07-01 (×4): 25 ug via INTRAVENOUS

## 2024-07-01 MED ORDER — MIDAZOLAM HCL 5 MG/5ML IJ SOLN
INTRAMUSCULAR | Status: AC
  Filled 2024-07-01: qty 5

## 2024-07-01 MED ORDER — CITALOPRAM HYDROBROMIDE 20 MG PO TABS: 40.0000 mg | ORAL_TABLET | Freq: Every day | ORAL | Status: DC

## 2024-07-01 MED ORDER — ROSUVASTATIN CALCIUM 20 MG PO TABS
20.0000 mg | ORAL_TABLET | Freq: Every day | ORAL | Status: DC
  Administered 2024-07-01: 20 mg via ORAL
  Filled 2024-07-01: qty 1

## 2024-07-01 MED ORDER — CHLORHEXIDINE GLUCONATE 4 % EX SOLN
4.0000 | Freq: Once | CUTANEOUS | Status: DC
  Filled 2024-07-01: qty 60

## 2024-07-01 MED ORDER — HEPARIN (PORCINE) IN NACL 1000-0.9 UT/500ML-% IV SOLN
INTRAVENOUS | Status: DC | PRN
  Administered 2024-07-01: 500 mL

## 2024-07-01 MED ORDER — SODIUM CHLORIDE 0.9 % IV SOLN
INTRAVENOUS | Status: AC
  Administered 2024-07-01: 80 mg
  Filled 2024-07-01: qty 2

## 2024-07-01 MED ORDER — CEFAZOLIN SODIUM-DEXTROSE 2-4 GM/100ML-% IV SOLN: 2.0000 g | INTRAVENOUS | Status: AC

## 2024-07-01 MED ORDER — ACETAMINOPHEN 325 MG PO TABS
325.0000 mg | ORAL_TABLET | ORAL | Status: DC | PRN
  Administered 2024-07-01 – 2024-07-02 (×2): 650 mg via ORAL
  Filled 2024-07-01 (×2): qty 2

## 2024-07-01 MED ORDER — METFORMIN HCL 500 MG PO TABS: 1000.0000 mg | ORAL_TABLET | Freq: Two times a day (BID) | ORAL | Status: DC

## 2024-07-01 MED ORDER — IOHEXOL 350 MG/ML SOLN
INTRAVENOUS | Status: DC | PRN
  Administered 2024-07-01: 10 mL

## 2024-07-01 MED ORDER — ALBUTEROL SULFATE HFA 108 (90 BASE) MCG/ACT IN AERS: 2.0000 | INHALATION_SPRAY | Freq: Four times a day (QID) | RESPIRATORY_TRACT | Status: DC | PRN

## 2024-07-01 MED ORDER — CEFAZOLIN SODIUM-DEXTROSE 2-4 GM/100ML-% IV SOLN
INTRAVENOUS | Status: AC
  Administered 2024-07-01: 2 g via INTRAVENOUS
  Filled 2024-07-01: qty 100

## 2024-07-01 MED ORDER — SODIUM CHLORIDE 0.9 % IV SOLN: INTRAVENOUS | Status: DC

## 2024-07-01 MED ORDER — MIDAZOLAM HCL 5 MG/5ML IJ SOLN
INTRAMUSCULAR | Status: DC | PRN
  Administered 2024-07-01: 1 mg via INTRAVENOUS
  Administered 2024-07-01 (×2): .5 mg via INTRAVENOUS

## 2024-07-01 MED ORDER — POVIDONE-IODINE 10 % EX SWAB
2.0000 | Freq: Once | CUTANEOUS | Status: AC
  Administered 2024-07-01: 2 via TOPICAL

## 2024-07-01 MED ORDER — SODIUM CHLORIDE 0.9 % IV SOLN: 80.0000 mg | INTRAVENOUS | Status: AC

## 2024-07-01 MED ORDER — SPIRONOLACTONE 12.5 MG HALF TABLET
12.5000 mg | ORAL_TABLET | Freq: Every day | ORAL | Status: DC
Start: 2024-07-02 — End: 2024-07-02

## 2024-07-01 MED ORDER — FUROSEMIDE 40 MG PO TABS
40.0000 mg | ORAL_TABLET | Freq: Every day | ORAL | Status: DC
Start: 2024-07-02 — End: 2024-07-02

## 2024-07-01 MED ORDER — LIDOCAINE HCL (PF) 1 % IJ SOLN
INTRAMUSCULAR | Status: DC | PRN
  Administered 2024-07-01: 60 mL

## 2024-07-01 MED ORDER — FENTANYL CITRATE (PF) 100 MCG/2ML IJ SOLN
INTRAMUSCULAR | Status: AC
  Filled 2024-07-01: qty 2

## 2024-07-01 MED ORDER — ALBUTEROL SULFATE (2.5 MG/3ML) 0.083% IN NEBU: 2.5000 mg | INHALATION_SOLUTION | Freq: Four times a day (QID) | RESPIRATORY_TRACT | Status: DC | PRN

## 2024-07-01 MED ORDER — DIGOXIN 125 MCG PO TABS: 0.1250 mg | ORAL_TABLET | Freq: Every day | ORAL | Status: DC

## 2024-07-01 MED ORDER — LIDOCAINE HCL 1 % IJ SOLN
INTRAMUSCULAR | Status: AC
Start: 2024-07-01 — End: 2024-07-01
  Filled 2024-07-01: qty 60

## 2024-07-01 MED ORDER — EMPAGLIFLOZIN 10 MG PO TABS: 10.0000 mg | ORAL_TABLET | Freq: Every day | ORAL | Status: DC

## 2024-07-01 SURGICAL SUPPLY — 14 items
BALLOON COR SINUS VENO 6FR 80 (BALLOONS) IMPLANT
CABLE SURGICAL S-101-97-12 (CABLE) ×1 IMPLANT
CATH CPS DIRECT 135 DS2C020 (CATHETERS) IMPLANT
ICD GALLANT HFCRTD CDHFA500Q (ICD Generator) IMPLANT
LEAD DURATA 7122Q-65CM (Lead) IMPLANT
LEAD QUARTET 1458Q-86CM (Lead) IMPLANT
LEAD ULTIPACE 52 LPA1231/52 (Lead) IMPLANT
PAD DEFIB RADIO PHYSIO CONN (PAD) ×1 IMPLANT
SHEATH 7FR PRELUDE SNAP 13 (SHEATH) IMPLANT
SHEATH 9.5FR PRELUDE SNAP 13 (SHEATH) IMPLANT
SLITTER AGILIS HISPRO (INSTRUMENTS) IMPLANT
TRAY PACEMAKER INSERTION (PACKS) ×1 IMPLANT
WIRE ACUITY WHISPER EDS 4648 (WIRE) IMPLANT
WIRE HI TORQ VERSACORE-J 145CM (WIRE) IMPLANT

## 2024-07-01 NOTE — Plan of Care (Signed)
  Problem: Education: Goal: Knowledge of cardiac device and self-care will improve Outcome: Progressing

## 2024-07-01 NOTE — H&P (Signed)
 Electrophysiology Note:   Date:  07/01/24  ID:  Makayla Owens, DOB 08-12-1961, MRN [id number]   Primary Cardiologist: None Electrophysiologist: Fonda Kitty, MD       History of Present Illness:   Makayla Owens is a 63 y.o. female with h/o tobacco abuse, HFrEF, CAD 1 vessel disease, HLD, LBBB, hx of breast cancer (s/p adriamycin & double mastectomy in 2008/9) who is being seen today for evaluaiton for CRT-D.    She was seen in the ED 03/03/2024 for leg edema. BNP > 2000. TTE with Biventricular HF EF < 20% RV severely reduced. LHC  with single vessel CAD LAD 75-80% out of proportion to extent of heart failure. She has been started on medications for her reduced LVEF, which she is taking. Medications have been limited by blood pressure. She has diuresed well. Despite this, she continues to have exertional fatigue and dyspnea with minimal activity.   Interval: Patient presents today for scheduled CRT-D implant. Reports feeling relatively well. No new or acute complaints.    Review of systems complete and found to be negative unless listed in HPI.    EP Information / Studies Reviewed:     EKG is not ordered today. EKG from 04/17/24 reviewed which showed sinus rhythm with LBBB, QRS .       Echo 03/19/24:   1. No evidence of LV thrombus by Definity . Left ventricular ejection  fraction, by estimation, is <20%. Left ventricular ejection fraction by 3D  volume is 26 %. The left ventricle has severely decreased function. The  left ventricle demonstrates global  hypokinesis. The left ventricular internal cavity size was severely  dilated. Indeterminate diastolic filling due to E-A fusion.   2. Right ventricular systolic function is severely reduced. The right  ventricular size is normal. There is normal pulmonary artery systolic  pressure.   3. Left atrial size was moderately dilated.   4. Right atrial size was mildly dilated.   5. A small pericardial effusion is present. The  pericardial effusion is  localized near the right atrium and posterior to the left ventricle.   6. The mitral valve is normal in structure. Mild mitral valve  regurgitation. No evidence of mitral stenosis.   7. The aortic valve is tricuspid. There is mild calcification of the  aortic valve. Aortic valve regurgitation is not visualized. No aortic  stenosis is present.   8. The inferior vena cava is dilated in size with <50% respiratory  variability, suggesting right atrial pressure of 15 mmHg.    Cardiac MRI 04/24/24:  IMPRESSION: 1. Severe decrease in left ventricular systolic function (LVEF =14%). Severely dilated left ventricular size.   2. Moderate decrease in right ventricular systolic function (RVEF =31%). Severe dilation in right ventricular size.   3. Moderate, circumferential pericardial effusion without evidence of tamponade.       Physical Exam:    Today's Vitals   07/01/24 1344 07/01/24 1355  BP: 97/70   Pulse: 80   Resp: 16   Temp: 98.4 F (36.9 C)   SpO2: 92%   Weight: 63 kg   Height: 5' 6 (1.676 m)   PainSc:  8    Body mass index is 22.44 kg/m.   GEN: Well nourished, well developed in no acute distress NECK: No JVD CARDIAC: Normal rate, regular rhythm RESPIRATORY:  Clear to auscultation without rales, wheezing or rhonchi  ABDOMEN: Soft, non-distended EXTREMITIES:  No edema; No deformity    ASSESSMENT AND PLAN:     #.  Chronic systolic heart failure: Predominantly NICM (either LBBB or chemo-induced), although does have LAD disease, HF felt to be out of proportion to CAD. NYHA class III. #. LBBB:  Rather typical appearing, QRS . - Patient meets criteria for CRT given that LVEF <20% despite 3 months of guideline directed medical therapy in the setting of left bundle branch block and NYHA class III symptoms.  Explained risks, benefits, and alternatives to ICD implantation, including but not limited to bleeding, infection, damage to heart or lungs, heart  attack, stroke, or death.  Pt verbalized understanding and wants to proceed. - Continue excellent GDMT regimen - empagliflozin  10 mg once daily, metoprolol  XL 12.5 mg once daily - and follow-up with primary HF cardiologist, Dr. Gardenia.   Follow up with Dr. Kennyth 3 months after CRT implant.    Signed, Fonda Kennyth, MD

## 2024-07-02 ENCOUNTER — Ambulatory Visit (HOSPITAL_COMMUNITY)

## 2024-07-02 DIAGNOSIS — Z79899 Other long term (current) drug therapy: Secondary | ICD-10-CM | POA: Diagnosis not present

## 2024-07-02 DIAGNOSIS — E785 Hyperlipidemia, unspecified: Secondary | ICD-10-CM | POA: Diagnosis not present

## 2024-07-02 DIAGNOSIS — I5022 Chronic systolic (congestive) heart failure: Secondary | ICD-10-CM | POA: Diagnosis not present

## 2024-07-02 DIAGNOSIS — I428 Other cardiomyopathies: Secondary | ICD-10-CM | POA: Diagnosis not present

## 2024-07-02 DIAGNOSIS — I2510 Atherosclerotic heart disease of native coronary artery without angina pectoris: Secondary | ICD-10-CM | POA: Diagnosis not present

## 2024-07-02 DIAGNOSIS — Z853 Personal history of malignant neoplasm of breast: Secondary | ICD-10-CM | POA: Diagnosis not present

## 2024-07-02 DIAGNOSIS — Z87891 Personal history of nicotine dependence: Secondary | ICD-10-CM | POA: Diagnosis not present

## 2024-07-02 DIAGNOSIS — I5082 Biventricular heart failure: Secondary | ICD-10-CM | POA: Diagnosis not present

## 2024-07-02 DIAGNOSIS — I447 Left bundle-branch block, unspecified: Secondary | ICD-10-CM | POA: Diagnosis not present

## 2024-07-02 DIAGNOSIS — Z9013 Acquired absence of bilateral breasts and nipples: Secondary | ICD-10-CM | POA: Diagnosis not present

## 2024-07-02 NOTE — Discharge Instructions (Addendum)
 After Your ICD (Implantable Cardiac Defibrillator)   You have a Abbott ICD  ACTIVITY Do not lift your arm above shoulder height for 1 week after your procedure. After 7 days, you may progress as below.  You should remove your sling 24 hours after your procedure, unless otherwise instructed by your provider.     Thursday July 09, 2024  Friday July 10, 2024 Saturday July 11, 2024 Sunday July 12, 2024   Do not lift, push, pull, or carry anything over 10 pounds with the affected arm until 6 weeks (Thursday August 13, 2024 ) after your procedure.   You may drive AFTER your wound check, unless you have been told otherwise by your provider.   Ask your healthcare provider when you can go back to work   INCISION/Dressing If you are on a blood thinner such as Coumadin, Xarelto, Eliquis, Plavix, or Pradaxa please confirm with your provider when this should be resumed.   If large square, outer bandage is left in place, this can be removed after 24 hours from your procedure. Do not remove steri-strips or glue as below.   Monitor your defibrillator site for redness, swelling, and drainage. Call the device clinic at [phone] if you experience these symptoms or fever/chills.  If your incision is sealed with Steri-strips or staples, you may shower 7 days after your procedure or when told by your provider. Do not remove the steri-strips or let the shower hit directly on your site. You may wash around your site with soap and water .    If you were discharged in a sling, please do not wear this during the day more than 48 hours after your surgery unless otherwise instructed. This may increase the risk of stiffness and soreness in your shoulder.   Avoid lotions, ointments, or perfumes over your incision until it is well-healed.  You may use a hot tub or a pool AFTER your wound check appointment if the incision is completely closed.  Your ICD is designed to protect you from life threatening  heart rhythms. Because of this, you may receive a shock.   1 shock with no symptoms:  Call the office during business hours. 1 shock with symptoms (chest pain, chest pressure, dizziness, lightheadedness, shortness of breath, overall feeling unwell):  Call 911. If you experience 2 or more shocks in 24 hours:  Call 911. If you receive a shock, you should not drive for 6 months per the  DMV IF you receive appropriate therapy from your ICD.   ICD Alerts:  Some alerts are vibratory and others beep. These are NOT emergencies. Please call our office to let us  know. If this occurs at night or on weekends, it can wait until the next business day. Send a remote transmission.  If your device is capable of reading fluid status (for heart failure), you will be offered monthly monitoring to review this with you.   DEVICE MANAGEMENT Remote monitoring is used to monitor your ICD from home. This monitoring is scheduled every 91 days by our office. It allows us  to keep an eye on the functioning of your device to ensure it is working properly. You will routinely see your Electrophysiologist annually (more often if necessary).   You should receive your ID card for your new device in 4-8 weeks. Keep this card with you at all times once received. Consider wearing a medical alert bracelet or necklace.  Your ICD  may be MRI compatible. This will be discussed at your next  office visit/wound check.  You should avoid contact with strong electric or magnetic fields.   Do not use amateur (ham) radio equipment or electric (arc) welding torches. MP3 player headphones with magnets should not be used. Some devices are safe to use if held at least 12 inches (30 cm) from your defibrillator. These include power tools, lawn mowers, and speakers. If you are unsure if something is safe to use, ask your health care provider.  When using your cell phone, hold it to the ear that is on the opposite side from the defibrillator. Do not  leave your cell phone in a pocket over the defibrillator.  You may safely use electric blankets, heating pads, computers, and microwave ovens.  Call the office right away if: You have chest pain. You feel more than one shock. You feel more short of breath than you have felt before. You feel more light-headed than you have felt before. Your incision starts to open up.  This information is not intended to replace advice given to you by your health care provider. Make sure you discuss any questions you have with your health care provider.

## 2024-07-02 NOTE — Plan of Care (Signed)
  Problem: Education: Goal: Knowledge of cardiac device and self-care will improve Outcome: Progressing   Problem: Cardiac: Goal: Ability to achieve and maintain adequate cardiopulmonary perfusion will improve Outcome: Progressing   Problem: Education: Goal: Knowledge of General Education information will improve Description: Including pain rating scale, medication(s)/side effects and non-pharmacologic comfort measures Outcome: Progressing   Problem: Clinical Measurements: Goal: Ability to maintain clinical measurements within normal limits will improve Outcome: Progressing Goal: Will remain free from infection Outcome: Progressing

## 2024-07-02 NOTE — Progress Notes (Signed)
 DISCHARGE NOTE HOME Makayla Owens to be discharged Home per MD order. Discussed prescriptions and follow up appointments with the patient. Prescriptions given to patient; medication list explained in detail. Patient verbalized understanding.  Skin clean, dry and intact without evidence of skin break down, no evidence of skin tears noted. IV catheter discontinued intact. Site without signs and symptoms of complications. Dressing and pressure applied. Pt denies pain at the site currently. No complaints noted.  Patient free of lines, drains, and wounds.   An After Visit Summary (AVS) was printed and given to the patient. Patient escorted via wheelchair, and discharged home via private auto.  Makayla Shutes K Khali Albanese, RN

## 2024-07-02 NOTE — Discharge Summary (Addendum)
 ELECTROPHYSIOLOGY PROCEDURE DISCHARGE SUMMARY    Patient ID: Makayla Owens,  MRN: [id number], DOB/AGE: Apr 04, 1961 63 y.o.  Admit date: 07/01/2024 Discharge date: 07/02/2024  Primary Care Physician: Madelon Donald HERO, DO  Primary Cardiologist: None  Electrophysiologist: Dr. Kennyth    Primary Diagnosis:  Chronic systolic CHF    Secondary Diagnosis: CAD LBBB  Allergies  Allergen Reactions   Latex Rash   Nitrofurantoin Itching and Rash   Elavil  [Amitriptyline ]     Felt bad and seemed to cause UTIs   Lipitor [Atorvastatin ] Nausea Only    Muscle pain and nausea   Morphine  And Codeine  Other (See Comments)    Does not like side effects, pt could not sleep until out of her system   Statins     Body aches   Zofran  [Ondansetron  Hcl] Nausea And Vomiting     Procedures This Admission:  1.  Implantation of a Abbott BiV ICD on 07/01/2024 by Dr. Kennyth.  The patient received a Abbot Gallant CDVHRA500Q (BiV)  with Abbott Ultipace 1231-52 right atrial lead and Abbott Durata 7122 right ventricular lead. Pt received a Abbott Quartet 1458Q LV lead. DFTs were deferred at time of implant There were no post procedure complications 2.  CXR on 07/02/2024 demonstrated no pneumothorax status post device implantation.      Brief HPI: Makayla Owens is a 63 y.o. female was referred to electrophysiology in the outpatient setting  for consideration of ICD implantation.  Past medical history includes above.  The patient has persistent LV dysfunction despite guideline directed therapy.  Risks, benefits, and alternatives to ICD implantation were reviewed with the patient who wished to proceed.   Hospital Course:  The patient was admitted and underwent implantation of a Abbott BiV ICD with details as outlined above. They were monitored on telemetry overnight which demonstrated appropriate pacing .  Left chest was without hematoma or ecchymosis.  The device was interrogated and found to be functioning  normally.  CXR was obtained and demonstrated no pneumothorax status post device implantation..  Wound care, arm mobility, and restrictions were reviewed with the patient.  The patient was examined and considered stable for discharge to home.   The patient's discharge medications does not include an ACE-I/ARB/ARNI (intolerant) and does not beta blocker (previously intolerant).   Anticoagulation resumption This patient is not on anticoagulation.  Physical Exam: Vitals:   07/01/24 1820 07/01/24 1950 07/01/24 2349 07/02/24 0440  BP:   (!) 93/57 102/75  Pulse: (P) 79 81  71  Resp: (P) 17 17  17   Temp: (P) 98.6 F (37 C) 98 F (36.7 C) 98.3 F (36.8 C) 98 F (36.7 C)  TempSrc: (P) Oral Oral Oral Oral  SpO2:   94%   Weight:      Height:        GEN- NAD. A&O x 3.  HEENT: Normocephalic, atraumatic Lungs- CTAB, normal effort.  Heart- RRR. No M/G/R.  GI- Soft, NT, ND.  Extremities- No clubbing, cyanosis, or edema Skin- Warm and dry, no rash or lesion. ICD site c/d/I. Outer dressing removed, steri-strips to remain in place  Discharge Medications:  Allergies as of 07/02/2024       Reactions   Latex Rash   Nitrofurantoin Itching, Rash   Elavil  [amitriptyline ]    Felt bad and seemed to cause UTIs   Lipitor [atorvastatin ] Nausea Only   Muscle pain and nausea   Morphine  And Codeine  Other (See Comments)   Does not like side  effects, pt could not sleep until out of her system   Statins    Body aches   Zofran  [ondansetron  Hcl] Nausea And Vomiting        Medication List     TAKE these medications    albuterol  108 (90 Base) MCG/ACT inhaler Commonly known as: VENTOLIN  HFA Inhale 2 puffs into the lungs every 6 (six) hours as needed for wheezing or shortness of breath.   citalopram  40 MG tablet Commonly known as: CELEXA  Take 1 tablet by mouth once daily   digoxin  0.125 MG tablet Commonly known as: LANOXIN  Take 1 tablet (0.125 mg total) by mouth daily.   empagliflozin  10 MG  Tabs tablet Commonly known as: JARDIANCE  Take 1 tablet (10 mg total) by mouth daily.   furosemide  20 MG tablet Commonly known as: LASIX  Take 2 tablets (40 mg total) by mouth daily.   ibuprofen  200 MG tablet Commonly known as: ADVIL  Take 800 mg by mouth 2 (two) times daily.   metFORMIN  1000 MG tablet Commonly known as: GLUCOPHAGE  TAKE 1 TABLET BY MOUTH TWICE DAILY WITH A MEAL   rosuvastatin  20 MG tablet Commonly known as: Crestor  Take 1 tablet (20 mg total) by mouth daily.   spironolactone  25 MG tablet Commonly known as: ALDACTONE  Take 0.5 tablets (12.5 mg total) by mouth daily.        Disposition: Home with usual follow up as in AVS  Duration of Discharge Encounter:  APP time: 5 minutes  Signed, Suzann Riddle, NP  07/02/2024 7:59 AM  I have seen, examined the patient, and reviewed the above assessment and plan.    Hospital Course:  Makayla Owens is a 62 y.o. female with h/o tobacco abuse, HFrEF, CAD 1 vessel disease, HLD, LBBB, hx of breast cancer (s/p adriamycin & double mastectomy in 2008/9) who presented for planned CRT-D.  Patient was monitored overnight with no obvious complications.  On postop day 1, patient reported feeling relatively well with no new or acute complications and was discharged.  General: Well developed, in no acute distress.  Neck: No JVD.  Cardiac: Normal rate, regular rhythm.  Left chest ICD pocket without bleeding or hematoma. Resp: Normal work of breathing.  Ext: No edema.  Neuro: No gross focal deficits.  Psych: Normal affect.   Plan:  - Chest x-ray with appropriate lead positions. -Bedside device interrogation was performed with appropriate device function and stable lead parameters. -Usual post implant teaching regarding activity restrictions and wound care was provided. - Follow-up in device clinic in approximately 2 weeks.  Fonda Kitty, MD, [facility], [facility] Cardiac Electrophysiology

## 2024-07-03 ENCOUNTER — Encounter: Payer: Self-pay | Admitting: Emergency Medicine

## 2024-07-16 ENCOUNTER — Ambulatory Visit: Attending: Internal Medicine

## 2024-07-16 DIAGNOSIS — I447 Left bundle-branch block, unspecified: Secondary | ICD-10-CM | POA: Insufficient documentation

## 2024-07-16 LAB — CUP PACEART INCLINIC DEVICE CHECK
Battery Remaining Longevity: 85 mo
Brady Statistic RA Percent Paced: 0.46 %
Brady Statistic RV Percent Paced: 98 %
Date Time Interrogation Session: 20250814135554
HighPow Impedance: 88.875
Implantable Lead Connection Status: 753985
Implantable Lead Connection Status: 753985
Implantable Lead Connection Status: 753985
Implantable Lead Implant Date: 20250730
Implantable Lead Implant Date: 20250730
Implantable Lead Implant Date: 20250730
Implantable Lead Location: 753858
Implantable Lead Location: 753859
Implantable Lead Location: 753860
Implantable Pulse Generator Implant Date: 20250730
Lead Channel Impedance Value: 462.5 Ohm
Lead Channel Impedance Value: 487.5 Ohm
Lead Channel Impedance Value: 787.5 Ohm
Lead Channel Pacing Threshold Amplitude: 0.5 V
Lead Channel Pacing Threshold Amplitude: 0.5 V
Lead Channel Pacing Threshold Amplitude: 1 V
Lead Channel Pacing Threshold Amplitude: 1.375 V
Lead Channel Pacing Threshold Pulse Width: 0.5 ms
Lead Channel Pacing Threshold Pulse Width: 0.5 ms
Lead Channel Pacing Threshold Pulse Width: 0.5 ms
Lead Channel Pacing Threshold Pulse Width: 0.5 ms
Lead Channel Sensing Intrinsic Amplitude: 11.8 mV
Lead Channel Sensing Intrinsic Amplitude: 5 mV
Lead Channel Setting Pacing Amplitude: 1.875
Lead Channel Setting Pacing Amplitude: 2 V
Lead Channel Setting Pacing Amplitude: 3.5 V
Lead Channel Setting Pacing Pulse Width: 0.5 ms
Lead Channel Setting Pacing Pulse Width: 0.5 ms
Lead Channel Setting Sensing Sensitivity: 0.5 mV
Pulse Gen Serial Number: 211047352
Zone Setting Status: 755011

## 2024-07-16 NOTE — Progress Notes (Signed)
 Normal multi chamber ICD wound check. Wound well healed. Presenting rhythm: AS/BP 105 . Routine testing performed. Thresholds, sensing, and impedances consistent with implant measurements. No treated arrhythmias. Reviewed arm restrictions to continue for 6 weeks total post op. Reviewed shock plan.  Pt enrolled in remote follow-up.

## 2024-07-16 NOTE — Patient Instructions (Signed)

## 2024-07-19 ENCOUNTER — Ambulatory Visit: Payer: Self-pay | Admitting: Cardiology

## 2024-07-28 ENCOUNTER — Encounter: Payer: Self-pay | Admitting: Pulmonary Disease

## 2024-08-10 ENCOUNTER — Telehealth (HOSPITAL_COMMUNITY): Payer: Self-pay | Admitting: Cardiology

## 2024-08-13 ENCOUNTER — Ambulatory Visit

## 2024-08-13 DIAGNOSIS — I447 Left bundle-branch block, unspecified: Secondary | ICD-10-CM

## 2024-08-14 ENCOUNTER — Encounter (INDEPENDENT_AMBULATORY_CARE_PROVIDER_SITE_OTHER): Payer: Self-pay | Admitting: *Deleted

## 2024-08-19 LAB — CUP PACEART REMOTE DEVICE CHECK
Battery Remaining Longevity: 80 mo
Battery Remaining Percentage: 94 %
Battery Voltage: 3.04 V
Brady Statistic AP VP Percent: 1 %
Brady Statistic AP VS Percent: 1 %
Brady Statistic AS VP Percent: 98 %
Brady Statistic AS VS Percent: 1 %
Brady Statistic RA Percent Paced: 1 %
Date Time Interrogation Session: 20250916092237
HighPow Impedance: 69 Ohm
Implantable Lead Connection Status: 753985
Implantable Lead Connection Status: 753985
Implantable Lead Connection Status: 753985
Implantable Lead Implant Date: 20250730
Implantable Lead Implant Date: 20250730
Implantable Lead Implant Date: 20250730
Implantable Lead Location: 753858
Implantable Lead Location: 753859
Implantable Lead Location: 753860
Implantable Pulse Generator Implant Date: 20250730
Lead Channel Impedance Value: 410 Ohm
Lead Channel Impedance Value: 460 Ohm
Lead Channel Impedance Value: 750 Ohm
Lead Channel Pacing Threshold Amplitude: 0.5 V
Lead Channel Pacing Threshold Amplitude: 1 V
Lead Channel Pacing Threshold Amplitude: 1.375 V
Lead Channel Pacing Threshold Pulse Width: 0.5 ms
Lead Channel Pacing Threshold Pulse Width: 0.5 ms
Lead Channel Pacing Threshold Pulse Width: 0.5 ms
Lead Channel Sensing Intrinsic Amplitude: 11.8 mV
Lead Channel Sensing Intrinsic Amplitude: 3.4 mV
Lead Channel Setting Pacing Amplitude: 1.875
Lead Channel Setting Pacing Amplitude: 2 V
Lead Channel Setting Pacing Amplitude: 3.5 V
Lead Channel Setting Pacing Pulse Width: 0.5 ms
Lead Channel Setting Pacing Pulse Width: 0.5 ms
Lead Channel Setting Sensing Sensitivity: 0.5 mV
Pulse Gen Serial Number: 211047352
Zone Setting Status: 755011

## 2024-08-21 NOTE — Progress Notes (Signed)
 Remote PPM Transmission

## 2024-08-23 ENCOUNTER — Ambulatory Visit: Payer: Self-pay | Admitting: Cardiology

## 2024-09-01 ENCOUNTER — Encounter (HOSPITAL_COMMUNITY): Admitting: Cardiology

## 2024-09-30 ENCOUNTER — Encounter: Admitting: Pulmonary Disease

## 2024-09-30 ENCOUNTER — Encounter (HOSPITAL_COMMUNITY): Admitting: Cardiology

## 2024-09-30 ENCOUNTER — Ambulatory Visit: Admitting: Pulmonary Disease

## 2024-10-05 NOTE — Progress Notes (Unsigned)
  Electrophysiology Office Note:   ID:  Makayla Owens, DOB 03-17-1961, MRN [id number]  Primary Cardiologist: None Electrophysiologist: Fonda Kitty, MD  {Click to update primary MD,subspecialty MD or APP then REFRESH:1}    History of Present Illness:   Makayla Owens is a 63 y.o. female with h/o tobacco abuse, HFrEF, CAD 1 vessel disease, HLD, LBBB, hx of breast cancer (s/p adriamycin & double mastectomy in 2008/9) seen today for routine electrophysiology follow-up s/p Defibrillator implant.  Since last being seen in our clinic the patient reports doing ***.  she denies chest pain, palpitations, dyspnea, PND, orthopnea, nausea, vomiting, dizziness, syncope, edema, weight gain, or early satiety.    Review of systems complete and found to be negative unless listed in HPI.   EP Information / Studies Reviewed:    EKG is ordered today. Personal review as below.       ICD Interrogation-  reviewed in detail today,  See PACEART report.  Arrhythmia/Device History Abbott BIV ICD 07/01/2024 s/p Chronic systolic CHF and LBBB    Physical Exam:   VS:  There were no vitals taken for this visit.   Wt Readings from Last 3 Encounters:  07/01/24 139 lb (63 kg)  06/10/24 129 lb (58.5 kg)  05/19/24 126 lb (57.2 kg)     GEN: No acute distress *** NECK: No JVD; No carotid bruits CARDIAC: {EPRHYTHM:28826}, no murmurs, rubs, gallops RESPIRATORY:  Clear to auscultation without rales, wheezing or rhonchi  ABDOMEN: Soft, non-tender, non-distended EXTREMITIES:  {EDEMA LEVEL:28147::No} edema; No deformity   ASSESSMENT AND PLAN:    Chronic systolic CHF  s/p Abbott CRT-D  LBBB NICM euvolemic today Stable on an appropriate medical regimen Normal ICD function See Pace Art report No changes today  CAD No s/s of ischemia.      Disposition:   Follow up with {EPPROVIDERS:28135::EP Team} {EPFOLLOW UP:28173}   Signed, Ozell Prentice Passey, PA-C

## 2024-10-06 ENCOUNTER — Encounter: Payer: Self-pay | Admitting: Student

## 2024-10-06 ENCOUNTER — Ambulatory Visit: Attending: Student | Admitting: Student

## 2024-10-06 VITALS — BP 114/70 | HR 78 | Resp 16 | Ht 66.0 in | Wt 127.8 lb

## 2024-10-06 DIAGNOSIS — I428 Other cardiomyopathies: Secondary | ICD-10-CM | POA: Insufficient documentation

## 2024-10-06 DIAGNOSIS — I5022 Chronic systolic (congestive) heart failure: Secondary | ICD-10-CM | POA: Diagnosis not present

## 2024-10-06 DIAGNOSIS — I2510 Atherosclerotic heart disease of native coronary artery without angina pectoris: Secondary | ICD-10-CM | POA: Insufficient documentation

## 2024-10-06 DIAGNOSIS — I447 Left bundle-branch block, unspecified: Secondary | ICD-10-CM | POA: Insufficient documentation

## 2024-10-06 LAB — CUP PACEART INCLINIC DEVICE CHECK
Battery Remaining Longevity: 79 mo
Brady Statistic RA Percent Paced: 1.1 %
Brady Statistic RV Percent Paced: 99.12 %
Date Time Interrogation Session: 20251104102332
HighPow Impedance: 99 Ohm
Implantable Lead Connection Status: 753985
Implantable Lead Connection Status: 753985
Implantable Lead Connection Status: 753985
Implantable Lead Implant Date: 20250730
Implantable Lead Implant Date: 20250730
Implantable Lead Implant Date: 20250730
Implantable Lead Location: 753858
Implantable Lead Location: 753859
Implantable Lead Location: 753860
Implantable Pulse Generator Implant Date: 20250730
Lead Channel Impedance Value: 1000 Ohm
Lead Channel Impedance Value: 450 Ohm
Lead Channel Impedance Value: 512.5 Ohm
Lead Channel Pacing Threshold Amplitude: 0.5 V
Lead Channel Pacing Threshold Amplitude: 0.5 V
Lead Channel Pacing Threshold Amplitude: 0.875 V
Lead Channel Pacing Threshold Amplitude: 1.5 V
Lead Channel Pacing Threshold Pulse Width: 0.5 ms
Lead Channel Pacing Threshold Pulse Width: 0.5 ms
Lead Channel Pacing Threshold Pulse Width: 0.5 ms
Lead Channel Pacing Threshold Pulse Width: 0.5 ms
Lead Channel Sensing Intrinsic Amplitude: 12 mV
Lead Channel Sensing Intrinsic Amplitude: 5 mV
Lead Channel Setting Pacing Amplitude: 1.875
Lead Channel Setting Pacing Amplitude: 2 V
Lead Channel Setting Pacing Amplitude: 2.75 V
Lead Channel Setting Pacing Pulse Width: 0.5 ms
Lead Channel Setting Pacing Pulse Width: 0.5 ms
Lead Channel Setting Sensing Sensitivity: 0.5 mV
Pulse Gen Serial Number: 211047352
Zone Setting Status: 755011

## 2024-10-06 NOTE — Patient Instructions (Signed)
 Medication Instructions:  Your physician recommends that you continue on your current medications as directed. Please refer to the Current Medication list given to you today.  *If you need a refill on your cardiac medications before your next appointment, please call your pharmacy*  Lab Work: None ordered If you have labs (blood work) drawn today and your tests are completely normal, you will receive your results only by: MyChart Message (if you have MyChart) OR A paper copy in the mail If you have any lab test that is abnormal or we need to change your treatment, we will call you to review the results.  Follow-Up: At [facility], you and your health needs are our priority.  As part of our continuing mission to provide you with exceptional heart care, our providers are all part of one team.  This team includes your primary Cardiologist (physician) and Advanced Practice Providers or APPs (Physician Assistants and Nurse Practitioners) who all work together to provide you with the care you need, when you need it.  Your next appointment:   6 month(s)  Provider:   Fonda Kitty, MD    We recommend signing up for the patient portal called MyChart.  Sign up information is provided on this After Visit Summary.  MyChart is used to connect with patients for Virtual Visits (Telemedicine).  Patients are able to view lab/test results, encounter notes, upcoming appointments, etc.  Non-urgent messages can be sent to your provider as well.   To learn more about what you can do with MyChart, go to [email or web address].

## 2024-10-07 ENCOUNTER — Ambulatory Visit: Payer: Self-pay | Admitting: Cardiology

## 2024-10-17 NOTE — Progress Notes (Signed)
   ADVANCED HEART FAILURE FOLLOW UP CLINIC NOTE  Referring Physician: Madelon Donald HERO, DO  Primary Care: Madelon Donald HERO, DO Primary Cardiologist:  HPI: Makayla Owens is a 63 y.o. female who presents for follow up of chronic systolic heart failure.      Patient has a history of history of tobacco abuse, HFrEF, CAD, HLD, LBBB, hx of breast cancer (s/p adriamycin & double mastectomy in 2008/9).   Patient reports first feeling SOB in early 2025. She was seen in the ED 03/03/2024 for leg edema. BNP > 2000. TTE with Biventricular HF EF < 20% RV severely reduced. LHC  with single vessel CAD LAD 75-80% out of proportion to extent of heart failure. Medical management was recommended.  Since that time she established care in AHF clinic with gradual uptitration of GDMT.      SUBJECTIVE:  Reports that she has had issues with the metoprolol  since it was started, as it caused her significant headaches. She also stopped taking her statin with improvement in her symptoms of muscle aches and pains. She is off the spironolactone , but reports that this was due to the prescription not being filled. Otherwise compliant with her medications, feeling better with more energy since BiV device placed. She does still get fatigued later in the day. No volume symptoms.   PMH, current medications, allergies, social history, and family history reviewed in epic.  PHYSICAL EXAM: Vitals:   10/19/24 0920  BP: 118/70  Pulse: 82  SpO2: 95%   GENERAL: Well nourished and in no apparent distress at rest.  PULM:  Normal work of breathing, clear to auscultation bilaterally. Respirations are unlabored.  CARDIAC:  JVP: flat         Normal rate with regular rhythm. No murmurs, rubs or gallops.  No edema. Warm and well perfused extremities. ABDOMEN: Soft, non-tender, non-distended. NEUROLOGIC: Patient is oriented x3 with no focal or lateralizing neurologic deficits.    DATA REVIEW  ECG: 10/2024: NSR, biatrial  enlargement    ECHO: 03/2024 LVEF 20% RV severely reduced.    CATH: 04/2024: Single vessel LAD disease with 75-80% stenosis after D1, moderately elevated LVEDP, CO/CI 3.5/2.04   CMR: 04/2024: LVEF 14%, severely dilated LV, moderately reduced RV, severely dilated, RV insertion point LGE    ASSESSMENT & PLAN:  Chronic systolic heart failure: Predominant NICM, potentially anthracycline induced though with wide LBBB s/p CRT-D. Moderate LAD disease as well. Improved NYHA class II symptoms. - Did not tolerate BB, added metoprolol  to allergy list for headaches - BP improved with CRT, add losartan  25mg  nightly - BMP in 2 weeks - Hold restarting MRA with borderline K in the past and minimal volume symptoms - Continue digoxin  0.125mg  daily, recent dig level from 06/2024 wnl - Continue jardiance  10mg  daily - Euvolemic, no need for diuretic - Repeat echo ordered - Consider CPX versus RHC at follow up if symptoms worsening  CAD: - Moderate LAD disease - Did not tolerate crestory - Discuss repatha at next visit, but does not do well with lipid lowering medications  Hyperkalemia: - Improved off MRA, consider retrial in future  Tobacco abuse: - Has since quit  Hx of breast cancer: - Received adriamycin.  - Double mastectomy in 2009  COPD: - Referred to pulm at next visit, on hold. Discuss at follow up  Follow up in 2 months  Morene Brownie, MD Advanced Heart Failure Mechanical Circulatory Support 10/19/24

## 2024-10-19 ENCOUNTER — Encounter (HOSPITAL_COMMUNITY): Payer: Self-pay | Admitting: Cardiology

## 2024-10-19 ENCOUNTER — Ambulatory Visit (HOSPITAL_COMMUNITY)
Admission: RE | Admit: 2024-10-19 | Discharge: 2024-10-19 | Disposition: A | Discharge status: Home / Self Care | Source: Ambulatory Visit | Attending: Cardiology | Admitting: Cardiology

## 2024-10-19 VITALS — BP 118/70 | HR 82

## 2024-10-19 DIAGNOSIS — I2510 Atherosclerotic heart disease of native coronary artery without angina pectoris: Secondary | ICD-10-CM | POA: Diagnosis not present

## 2024-10-19 DIAGNOSIS — Z87891 Personal history of nicotine dependence: Secondary | ICD-10-CM | POA: Diagnosis not present

## 2024-10-19 DIAGNOSIS — E7800 Pure hypercholesterolemia, unspecified: Secondary | ICD-10-CM

## 2024-10-19 DIAGNOSIS — I428 Other cardiomyopathies: Secondary | ICD-10-CM | POA: Diagnosis not present

## 2024-10-19 DIAGNOSIS — Z9013 Acquired absence of bilateral breasts and nipples: Secondary | ICD-10-CM | POA: Diagnosis not present

## 2024-10-19 DIAGNOSIS — I5082 Biventricular heart failure: Secondary | ICD-10-CM | POA: Diagnosis not present

## 2024-10-19 DIAGNOSIS — J449 Chronic obstructive pulmonary disease, unspecified: Secondary | ICD-10-CM | POA: Diagnosis not present

## 2024-10-19 DIAGNOSIS — E785 Hyperlipidemia, unspecified: Secondary | ICD-10-CM | POA: Diagnosis not present

## 2024-10-19 DIAGNOSIS — I5022 Chronic systolic (congestive) heart failure: Secondary | ICD-10-CM | POA: Insufficient documentation

## 2024-10-19 DIAGNOSIS — Z79899 Other long term (current) drug therapy: Secondary | ICD-10-CM | POA: Insufficient documentation

## 2024-10-19 DIAGNOSIS — Z853 Personal history of malignant neoplasm of breast: Secondary | ICD-10-CM | POA: Diagnosis not present

## 2024-10-19 MED ORDER — ROSUVASTATIN CALCIUM 20 MG PO TABS: 20.0000 mg | ORAL_TABLET | Freq: Every day | ORAL | 3 refills | Status: DC

## 2024-10-19 MED ORDER — LOSARTAN POTASSIUM 25 MG PO TABS: 25.0000 mg | ORAL_TABLET | Freq: Every evening | ORAL | 3 refills | Status: AC

## 2024-10-19 NOTE — Patient Instructions (Signed)
 START Losartan  25 mg nightly.  Blood work in 2 weeks.  Your physician recommends that you schedule a follow-up appointment in: 2 months. ( January 2026) ** PLEASE CALL THE OFFICE IN 3 WEEKS TO ARRANGE YOUR FOLLOW UP APPOINTMENT.**  If you have any questions or concerns before your next appointment please send us  a message through [place] or call our office at [phone].    TO LEAVE A MESSAGE FOR THE NURSE SELECT OPTION 2, PLEASE LEAVE A MESSAGE INCLUDING: YOUR NAME DATE OF BIRTH CALL BACK NUMBER REASON FOR CALL**this is important as we prioritize the call backs  YOU WILL RECEIVE A CALL BACK THE SAME DAY AS LONG AS YOU CALL BEFORE 4:00 PM  At the [facility], you and your health needs are our priority. As part of our continuing mission to provide you with exceptional heart care, we have created designated Provider Care Teams. These Care Teams include your primary Cardiologist (physician) and Advanced Practice Providers (APPs- Physician Assistants and Nurse Practitioners) who all work together to provide you with the care you need, when you need it.   You may see any of the following providers on your designated Care Team at your next follow up: Dr Toribio Fuel Dr Ezra Shuck Dr. Morene Brownie Greig Mosses, NP Caffie Shed, [place] [facility] [place], [place] Beckey Coe, NP Jordan Lee, NP Ellouise Class, NP Tinnie Redman, PharmD Jaun Bash, PharmD   Please be sure to bring in all your medications bottles to every appointment.    Thank you for choosing [facility]

## 2024-11-06 ENCOUNTER — Other Ambulatory Visit (HOSPITAL_COMMUNITY)
Admission: RE | Admit: 2024-11-06 | Discharge: 2024-11-06 | Disposition: A | Source: Ambulatory Visit | Attending: Cardiology | Admitting: Cardiology

## 2024-11-06 DIAGNOSIS — I5022 Chronic systolic (congestive) heart failure: Secondary | ICD-10-CM

## 2024-11-06 LAB — BASIC METABOLIC PANEL WITH GFR
Anion gap: 13 (ref 5–15)
BUN: 14 mg/dL (ref 8–23)
CO2: 23 mmol/L (ref 22–32)
Calcium: 9.2 mg/dL (ref 8.9–10.3)
Chloride: 101 mmol/L (ref 98–111)
Creatinine, Ser: 0.58 mg/dL (ref 0.44–1.00)
GFR, Estimated: >60 mL/min (ref >60)
Glucose, Bld: 128 mg/dL — ABNORMAL HIGH (ref 70–99)
Potassium: 4.6 mmol/L (ref 3.5–5.1)
Sodium: 136 mmol/L (ref 135–145)

## 2024-11-11 ENCOUNTER — Ambulatory Visit (HOSPITAL_COMMUNITY): Payer: Self-pay | Admitting: Cardiology

## 2024-11-12 ENCOUNTER — Ambulatory Visit

## 2024-11-12 LAB — CUP PACEART REMOTE DEVICE CHECK
Battery Remaining Longevity: 80 mo
Battery Remaining Percentage: 91 %
Battery Voltage: 3.01 V
Brady Statistic AP VP Percent: 1 %
Brady Statistic AP VS Percent: 1 %
Brady Statistic AS VP Percent: 99 %
Brady Statistic AS VS Percent: 1 %
Brady Statistic RA Percent Paced: 1 %
Date Time Interrogation Session: 20251211020225
HighPow Impedance: 96 Ohm
Implantable Lead Connection Status: 753985
Implantable Lead Connection Status: 753985
Implantable Lead Connection Status: 753985
Implantable Lead Implant Date: 20250730
Implantable Lead Implant Date: 20250730
Implantable Lead Implant Date: 20250730
Implantable Lead Location: 753858
Implantable Lead Location: 753859
Implantable Lead Location: 753860
Implantable Pulse Generator Implant Date: 20250730
Lead Channel Impedance Value: 1050 Ohm
Lead Channel Impedance Value: 430 Ohm
Lead Channel Impedance Value: 480 Ohm
Lead Channel Pacing Threshold Amplitude: 0.5 V
Lead Channel Pacing Threshold Amplitude: 1 V
Lead Channel Pacing Threshold Amplitude: 1.5 V
Lead Channel Pacing Threshold Pulse Width: 0.5 ms
Lead Channel Pacing Threshold Pulse Width: 0.5 ms
Lead Channel Pacing Threshold Pulse Width: 0.5 ms
Lead Channel Sensing Intrinsic Amplitude: 11.8 mV
Lead Channel Sensing Intrinsic Amplitude: 4.1 mV
Lead Channel Setting Pacing Amplitude: 2 V
Lead Channel Setting Pacing Amplitude: 2 V
Lead Channel Setting Pacing Amplitude: 2.75 V
Lead Channel Setting Pacing Pulse Width: 0.5 ms
Lead Channel Setting Pacing Pulse Width: 0.5 ms
Lead Channel Setting Sensing Sensitivity: 0.5 mV
Pulse Gen Serial Number: 211047352
Zone Setting Status: 755011

## 2024-11-19 NOTE — Progress Notes (Signed)
 Remote PPM Transmission

## 2024-11-20 ENCOUNTER — Ambulatory Visit: Payer: Self-pay | Admitting: Cardiology

## 2024-12-17 ENCOUNTER — Ambulatory Visit (HOSPITAL_COMMUNITY): Admitting: Cardiology

## 2025-01-04 ENCOUNTER — Ambulatory Visit (HOSPITAL_COMMUNITY): Admitting: Cardiology

## 2025-01-07 NOTE — Progress Notes (Unsigned)
" ° ° °  SUBJECTIVE:   CHIEF COMPLAINT / HPI:   Discussed the use of AI scribe software for clinical note transcription with the patient, who gave verbal consent to proceed.  History of Present Illness Makayla Owens is a 64 year old female with congestive heart failure who presents for follow-up of her heart condition and medication management.  Congestive heart failure and cardiac symptoms - [facility] 04/2024 with suspected nonischemic cardiomyopathy, incidental mLAD stenosis, LVEF <20%, biventricular dysfunction. recommended medical therapy. GDMT limited by hyperkalemia, BP. Recent BMP wnl. - Cardiac MRI 04/2024 - LVEF 14% and moderately decreased RV function. ICD in place. - No recurrent lower extremity swelling or shortness of breath since medication adjustments. - Current medications include digoxin , furosemide , and losartan . Compliant and tolerant of medications except for statin. Discontinued due to muscle cramps. - Followed by heart failure clinic.  Diabetes mellitus - Type 2 diabetes managed with empagliflozin  (Jardiance ) and metformin . - Hemoglobin A1c improved to 6.8% from 7.6% over the past year.  Depressive symptoms - Depression managed with citalopram  (Celexa ). - doing well.  Tobacco use and pulmonary function - Current tobacco use reduced from three packs per day to two to three cigarettes daily. - Rare use of albuterol  for respiratory symptoms. - Pulmonary function at 60%, attributed to tobacco use.  Oncologic history - History of breast cancer treated with chemotherapy. - Believes chemotherapy contributed to current cardiac condition. - was getting MRI every 2-3 years to interrogate implants, has f/u with breast surgeon soon.   HM - Needs pap, colon cancer screening   OBJECTIVE:   BP 113/68   Pulse 93   Ht 5' 6 (1.676 m)   Wt 126 lb 9.6 oz (57.4 kg)   SpO2 95%   BMI 20.43 kg/m   Gen: well appearing, in NAD Card: RRR Lungs: CTAB Ext: WWP, no  edema  ASSESSMENT/PLAN:   Chronic systolic heart failure (HCC) Euvolemic. Tolerating current GDMT well, BP remains low normal today. Continue to follow with Cardiology. Intolerant of statin, start zetia .  Type 2 diabetes mellitus with diabetic neuropathy, unspecified (HCC) A1c to goal today. Continue current therapy, jardiance  refilled.  CKD stage 1 due to type 2 diabetes mellitus (HCC) Reviewed recent BMP. UACR today. Continue jardiance .  Tobacco use disorder Down to 2-3 cigarettes daily. Continue efforts at cutting down, acknowledges stress-related smoking habits.  HYPERCHOLESTEROLEMIA Intolerant to multiple statins, start zetia . Consider repatha if not to goal.  Colon cancer screening Discussed. 1st degree relative of colon cancer with inability to do cologuard. Prefers to wait on referral given busy year with CHF.   History of breast cancer Reports silicone implant protrusion due to weight loss. Some soreness underneath noted, but no pain on the outside. Follow-up with plastic surgeon scheduled. - Continue follow-up with plastic surgeon for implant management.  Depression Doing well on current regimen, no changes made today.   General health maintenance Declined Pap smear today. Colon cancer screening and eye examination pending. - Referred to ophthalmologist for eye examination. - Plan for colon cancer screening at a later date.  F/u 3 months.  Donald CHRISTELLA Lai, DO "

## 2025-01-08 ENCOUNTER — Ambulatory Visit (HOSPITAL_COMMUNITY): Admitting: Cardiology

## 2025-01-08 ENCOUNTER — Ambulatory Visit: Admitting: Family Medicine

## 2025-01-08 ENCOUNTER — Encounter: Payer: Self-pay | Admitting: Family Medicine

## 2025-01-08 VITALS — BP 113/68 | HR 93 | Ht 66.0 in | Wt 126.6 lb

## 2025-01-08 DIAGNOSIS — F32A Depression, unspecified: Secondary | ICD-10-CM

## 2025-01-08 DIAGNOSIS — Z1211 Encounter for screening for malignant neoplasm of colon: Secondary | ICD-10-CM

## 2025-01-08 DIAGNOSIS — Z853 Personal history of malignant neoplasm of breast: Secondary | ICD-10-CM

## 2025-01-08 DIAGNOSIS — E1122 Type 2 diabetes mellitus with diabetic chronic kidney disease: Secondary | ICD-10-CM

## 2025-01-08 DIAGNOSIS — Z124 Encounter for screening for malignant neoplasm of cervix: Secondary | ICD-10-CM

## 2025-01-08 DIAGNOSIS — Z8679 Personal history of other diseases of the circulatory system: Secondary | ICD-10-CM

## 2025-01-08 DIAGNOSIS — E7800 Pure hypercholesterolemia, unspecified: Secondary | ICD-10-CM

## 2025-01-08 DIAGNOSIS — F17200 Nicotine dependence, unspecified, uncomplicated: Secondary | ICD-10-CM

## 2025-01-08 DIAGNOSIS — I428 Other cardiomyopathies: Secondary | ICD-10-CM

## 2025-01-08 DIAGNOSIS — I5022 Chronic systolic (congestive) heart failure: Secondary | ICD-10-CM

## 2025-01-08 DIAGNOSIS — E1140 Type 2 diabetes mellitus with diabetic neuropathy, unspecified: Secondary | ICD-10-CM

## 2025-01-08 LAB — POCT GLYCOSYLATED HEMOGLOBIN (HGB A1C): HbA1c, POC (controlled diabetic range): 6.8 % (ref 0.0–7.0)

## 2025-01-08 MED ORDER — EMPAGLIFLOZIN 10 MG PO TABS: 10.0000 mg | ORAL_TABLET | Freq: Every day | ORAL | 3 refills | Status: AC

## 2025-01-08 MED ORDER — EZETIMIBE 10 MG PO TABS: 10.0000 mg | ORAL_TABLET | Freq: Every day | ORAL | 3 refills | Status: AC

## 2025-01-08 NOTE — Assessment & Plan Note (Signed)
Doing well on current regimen, no changes made today. 

## 2025-01-08 NOTE — Assessment & Plan Note (Signed)
 Euvolemic. Tolerating current GDMT well, BP remains low normal today. Continue to follow with Cardiology. Intolerant of statin, start zetia .

## 2025-01-08 NOTE — Assessment & Plan Note (Signed)
 Discussed. 1st degree relative of colon cancer with inability to do cologuard. Prefers to wait on referral given busy year with CHF.

## 2025-01-08 NOTE — Patient Instructions (Signed)
 It was great to see you!  Our plans for today:  - We are starting a new medication for your cholesterol, zetia . Let me know if you don't tolerate this. - We are referring you to Opthalmology. Let us  know if you don't hear about an appointment in the next few weeks.  - No other changes to your medications.  Take care and seek immediate care sooner if you develop any concerns.   Dr. Harish Bram

## 2025-01-08 NOTE — Assessment & Plan Note (Addendum)
 Reports silicone implant protrusion due to weight loss. Some soreness underneath noted, but no pain on the outside. Follow-up with plastic surgeon scheduled. - Continue follow-up with plastic surgeon for implant management.

## 2025-01-08 NOTE — Assessment & Plan Note (Signed)
 Reviewed recent BMP. UACR today. Continue jardiance .

## 2025-01-08 NOTE — Assessment & Plan Note (Addendum)
 Down to 2-3 cigarettes daily. Continue efforts at cutting down, acknowledges stress-related smoking habits.

## 2025-01-08 NOTE — Assessment & Plan Note (Signed)
 Intolerant to multiple statins, start zetia . Consider repatha if not to goal.

## 2025-01-08 NOTE — Assessment & Plan Note (Addendum)
 A1c to goal today. Continue current therapy, jardiance  refilled.

## 2025-01-25 ENCOUNTER — Ambulatory Visit (HOSPITAL_COMMUNITY): Admitting: Cardiology

## 2025-02-04 ENCOUNTER — Ambulatory Visit: Admitting: Plastic Surgery

## 2025-02-11 ENCOUNTER — Encounter

## 2025-05-13 ENCOUNTER — Encounter

## 2025-08-12 ENCOUNTER — Encounter
